# Patient Record
Sex: Male | Born: 1955 | Race: White | Hispanic: No | Marital: Married | Smoking: Former smoker
Health system: Southern US, Community
[De-identification: ages and names within clinical notes are randomized; demographics above are authoritative.]

## PROBLEM LIST (undated history)

## (undated) ENCOUNTER — Emergency Department (HOSPITAL_BASED_OUTPATIENT_CLINIC_OR_DEPARTMENT_OTHER)

## (undated) DIAGNOSIS — I119 Hypertensive heart disease without heart failure: Secondary | ICD-10-CM

## (undated) DIAGNOSIS — J189 Pneumonia, unspecified organism: Secondary | ICD-10-CM

## (undated) DIAGNOSIS — F32A Depression, unspecified: Secondary | ICD-10-CM

## (undated) DIAGNOSIS — F1010 Alcohol abuse, uncomplicated: Secondary | ICD-10-CM

## (undated) DIAGNOSIS — G4733 Obstructive sleep apnea (adult) (pediatric): Secondary | ICD-10-CM

## (undated) DIAGNOSIS — R519 Headache, unspecified: Secondary | ICD-10-CM

## (undated) DIAGNOSIS — U071 COVID-19: Secondary | ICD-10-CM

## (undated) DIAGNOSIS — C61 Malignant neoplasm of prostate: Secondary | ICD-10-CM

## (undated) DIAGNOSIS — M1990 Unspecified osteoarthritis, unspecified site: Secondary | ICD-10-CM

## (undated) DIAGNOSIS — I480 Paroxysmal atrial fibrillation: Secondary | ICD-10-CM

## (undated) DIAGNOSIS — E669 Obesity, unspecified: Secondary | ICD-10-CM

## (undated) DIAGNOSIS — I5032 Chronic diastolic (congestive) heart failure: Secondary | ICD-10-CM

## (undated) DIAGNOSIS — I2510 Atherosclerotic heart disease of native coronary artery without angina pectoris: Secondary | ICD-10-CM

## (undated) DIAGNOSIS — I4891 Unspecified atrial fibrillation: Secondary | ICD-10-CM

## (undated) DIAGNOSIS — K219 Gastro-esophageal reflux disease without esophagitis: Secondary | ICD-10-CM

## (undated) DIAGNOSIS — R7303 Prediabetes: Secondary | ICD-10-CM

## (undated) DIAGNOSIS — Z973 Presence of spectacles and contact lenses: Secondary | ICD-10-CM

## (undated) DIAGNOSIS — F419 Anxiety disorder, unspecified: Secondary | ICD-10-CM

## (undated) DIAGNOSIS — E781 Pure hyperglyceridemia: Secondary | ICD-10-CM

## (undated) DIAGNOSIS — I499 Cardiac arrhythmia, unspecified: Secondary | ICD-10-CM

## (undated) DIAGNOSIS — E039 Hypothyroidism, unspecified: Secondary | ICD-10-CM

## (undated) DIAGNOSIS — Z87891 Personal history of nicotine dependence: Secondary | ICD-10-CM

## (undated) HISTORY — DX: Unspecified atrial fibrillation: I48.91

## (undated) HISTORY — DX: Obesity, unspecified: E66.9

## (undated) HISTORY — PX: COLONOSCOPY: SHX174

## (undated) HISTORY — DX: Hypertensive heart disease without heart failure: I11.9

## (undated) HISTORY — DX: Anxiety disorder, unspecified: F41.9

## (undated) HISTORY — DX: Pure hyperglyceridemia: E78.1

## (undated) HISTORY — PX: NO PAST SURGERIES: SHX2092

## (undated) HISTORY — DX: Alcohol abuse, uncomplicated: F10.10

## (undated) HISTORY — PX: PROSTATE BIOPSY: SHX241

## (undated) HISTORY — PX: OTHER SURGICAL HISTORY: SHX169

## (undated) HISTORY — DX: Atherosclerotic heart disease of native coronary artery without angina pectoris: I25.10

## (undated) HISTORY — DX: Paroxysmal atrial fibrillation: I48.0

## (undated) HISTORY — DX: Gastro-esophageal reflux disease without esophagitis: K21.9

## (undated) HISTORY — DX: Chronic diastolic (congestive) heart failure: I50.32

## (undated) HISTORY — DX: Personal history of nicotine dependence: Z87.891

## (undated) HISTORY — DX: Hypothyroidism, unspecified: E03.9

## (undated) HISTORY — DX: Obstructive sleep apnea (adult) (pediatric): G47.33

---

## 2012-08-16 ENCOUNTER — Encounter: Payer: Self-pay | Admitting: Gastroenterology

## 2012-09-18 ENCOUNTER — Ambulatory Visit (AMBULATORY_SURGERY_CENTER): Payer: BC Managed Care – PPO | Admitting: *Deleted

## 2012-09-18 VITALS — Ht 67.0 in | Wt 220.4 lb

## 2012-09-18 DIAGNOSIS — Z1211 Encounter for screening for malignant neoplasm of colon: Secondary | ICD-10-CM

## 2012-09-18 MED ORDER — NA SULFATE-K SULFATE-MG SULF 17.5-3.13-1.6 GM/177ML PO SOLN: ORAL | Status: DC

## 2012-09-19 ENCOUNTER — Encounter: Payer: Self-pay | Admitting: Gastroenterology

## 2012-10-01 ENCOUNTER — Encounter: Payer: Self-pay | Admitting: Gastroenterology

## 2012-10-01 ENCOUNTER — Ambulatory Visit (AMBULATORY_SURGERY_CENTER): Payer: BC Managed Care – PPO | Admitting: Gastroenterology

## 2012-10-01 VITALS — BP 148/84 | HR 68 | Temp 98.1°F | Resp 14 | Ht 67.0 in | Wt 220.0 lb

## 2012-10-01 DIAGNOSIS — Z1211 Encounter for screening for malignant neoplasm of colon: Secondary | ICD-10-CM

## 2012-10-01 DIAGNOSIS — D126 Benign neoplasm of colon, unspecified: Secondary | ICD-10-CM

## 2012-10-01 MED ORDER — SODIUM CHLORIDE 0.9 % IV SOLN: 500.0000 mL | INTRAVENOUS | Status: DC

## 2012-10-01 NOTE — Progress Notes (Signed)
Patient did not experience any of the following events: a burn prior to discharge; a fall within the facility; wrong site/side/patient/procedure/implant event; or a hospital transfer or hospital admission upon discharge from the facility. (G8907) Patient did not have preoperative order for IV antibiotic SSI prophylaxis. (G8918)  

## 2012-10-01 NOTE — Progress Notes (Signed)
Called to room to assist during endoscopic procedure.  Patient ID and intended procedure confirmed with present staff. Received instructions for my participation in the procedure from the performing physician.  

## 2012-10-01 NOTE — Progress Notes (Signed)
Lidocaine-40mg IV prior to Propofol InductionPropofol given over incremental dosages 

## 2012-10-01 NOTE — Patient Instructions (Signed)
YOU HAD AN ENDOSCOPIC PROCEDURE TODAY AT THE [facility]: Refer to the procedure report that was given to you for any specific questions about what was found during the examination.  If the procedure report does not answer your questions, please call your gastroenterologist to clarify.  If you requested that your care partner not be given the details of your procedure findings, then the procedure report has been included in a sealed envelope for you to review at your convenience later.  YOU SHOULD EXPECT: Some feelings of bloating in the abdomen. Passage of more gas than usual.  Walking can help get rid of the air that was put into your GI tract during the procedure and reduce the bloating. If you had a lower endoscopy (such as a colonoscopy or flexible sigmoidoscopy) you may notice spotting of blood in your stool or on the toilet paper. If you underwent a bowel prep for your procedure, then you may not have a normal bowel movement for a few days.  DIET: Your first meal following the procedure should be a light meal and then it is ok to progress to your normal diet.  A half-sandwich or bowl of soup is an example of a good first meal.  Heavy or fried foods are harder to digest and may make you feel nauseous or bloated.  Likewise meals heavy in dairy and vegetables can cause extra gas to form and this can also increase the bloating.  Drink plenty of fluids but you should avoid alcoholic beverages for 24 hours.  ACTIVITY: Your care partner should take you home directly after the procedure.  You should plan to take it easy, moving slowly for the rest of the day.  You can resume normal activity the day after the procedure however you should NOT DRIVE or use heavy machinery for 24 hours (because of the sedation medicines used during the test).    SYMPTOMS TO REPORT IMMEDIATELY: A gastroenterologist can be reached at any hour.  During normal business hours, 8:30 AM to 5:00 PM Monday through Friday,  call [phone].  After hours and on weekends, please call the GI answering service at [phone] who will take a message and have the physician on call contact you.   Following lower endoscopy (colonoscopy or flexible sigmoidoscopy):  Excessive amounts of blood in the stool  Significant tenderness or worsening of abdominal pains  Swelling of the abdomen that is new, acute  Fever of 100F or higher    FOLLOW UP: If any biopsies were taken you will be contacted by phone or by letter within the next 1-3 weeks.  Call your gastroenterologist if you have not heard about the biopsies in 3 weeks.  Our staff will call the home number listed on your records the next business day following your procedure to check on you and address any questions or concerns that you may have at that time regarding the information given to you following your procedure. This is a courtesy call and so if there is no answer at the home number and we have not heard from you through the emergency physician on call, we will assume that you have returned to your regular daily activities without incident.  SIGNATURES/CONFIDENTIALITY: You and/or your care partner have signed paperwork which will be entered into your electronic medical record.  These signatures attest to the fact that that the information above on your After Visit Summary has been reviewed and is understood.  Full responsibility of the confidentiality   of this discharge information lies with you and/or your care-partner.     

## 2012-10-01 NOTE — Op Note (Signed)
[facility] [address] [place], [address]   COLONOSCOPY PROCEDURE REPORT  PATIENT: Carl Humphrey, Carl Humphrey  MR#: [id number] BIRTHDATE: June 12, 1955 , 56  yrs. old GENDER: Male ENDOSCOPIST: Louis Meckel, MD REFERRED [id number] Ricki Miller, M.D. PROCEDURE DATE:  10/01/2012 PROCEDURE:   Colonoscopy with cold biopsy polypectomy ASA CLASS:   Class II INDICATIONS:average risk screening. MEDICATIONS: MAC sedation, administered by CRNA and propofol (Diprivan) 400mg  IV  DESCRIPTION OF PROCEDURE:   After the risks benefits and alternatives of the procedure were thoroughly explained, informed consent was obtained.  A digital rectal exam revealed external hemorrhoids.   The LB CF-[id number]  endoscope was introduced through the anus and advanced to the cecum, which was identified by both the appendix and ileocecal valve. No adverse events experienced.   The quality of the prep was excellent using Suprep The instrument was then slowly withdrawn as the colon was fully examined.      COLON FINDINGS: A sessile polyp measuring 2 mm in size was found in the sigmoid colon.  A polypectomy was performed with cold forceps. The resection was complete and the polyp tissue was completely retrieved.   Internal hemorrhoids were found.   The colon mucosa was otherwise normal.  Retroflexed views revealed no abnormalities. The time to cecum=1 minutes 46 seconds.  Withdrawal time=6 minutes 10 seconds.  The scope was withdrawn and the procedure completed. COMPLICATIONS: There were no complications.  ENDOSCOPIC IMPRESSION: 1.   Sessile polyp measuring 2 mm in size was found in the sigmoid colon; polypectomy was performed with cold forceps 2.   Internal hemorrhoids 3.   The colon mucosa was otherwise normal  RECOMMENDATIONS: If the polyp(s) removed today are proven to be adenomatous (pre-cancerous) polyps, you will need a repeat colonoscopy in 5 years.  Otherwise you should continue to  follow colorectal cancer screening guidelines for "routine risk" patients with colonoscopy in 10 years.  You will receive a letter within 1-2 weeks with the results of your biopsy as well as final recommendations.  Please call my office if you have not received a letter after 3 weeks.   eSigned:  Louis Meckel, MD 10/01/2012 8:31 AM   cc:

## 2012-10-02 ENCOUNTER — Telehealth: Payer: Self-pay | Admitting: *Deleted

## 2012-10-02 NOTE — Telephone Encounter (Signed)
  Follow up Call-  Call back number 10/01/2012  Post procedure Call Back phone  # [phone]  Permission to leave phone message Yes     Patient questions:  Message left for patient to call us if necessary.

## 2012-10-15 ENCOUNTER — Encounter: Payer: Self-pay | Admitting: Gastroenterology

## 2013-08-05 ENCOUNTER — Other Ambulatory Visit: Payer: Self-pay | Admitting: Internal Medicine

## 2013-08-05 DIAGNOSIS — R1032 Left lower quadrant pain: Secondary | ICD-10-CM

## 2013-08-06 ENCOUNTER — Ambulatory Visit
Admission: RE | Admit: 2013-08-06 | Discharge: 2013-08-06 | Disposition: A | Discharge status: Home / Self Care | Payer: BC Managed Care – PPO | Source: Ambulatory Visit | Attending: Internal Medicine | Admitting: Internal Medicine

## 2013-08-06 DIAGNOSIS — R1032 Left lower quadrant pain: Secondary | ICD-10-CM

## 2013-08-06 MED ORDER — IOHEXOL 300 MG/ML  SOLN
125.0000 mL | Freq: Once | INTRAMUSCULAR | Status: AC | PRN
  Administered 2013-08-06: 125 mL via INTRAVENOUS

## 2013-09-12 ENCOUNTER — Other Ambulatory Visit (HOSPITAL_COMMUNITY): Payer: Self-pay | Admitting: Internal Medicine

## 2013-09-12 DIAGNOSIS — K6389 Other specified diseases of intestine: Secondary | ICD-10-CM

## 2013-09-18 ENCOUNTER — Ambulatory Visit (HOSPITAL_COMMUNITY)
Admission: RE | Admit: 2013-09-18 | Discharge: 2013-09-18 | Disposition: A | Discharge status: Home / Self Care | Payer: BC Managed Care – PPO | Source: Ambulatory Visit | Attending: Internal Medicine | Admitting: Internal Medicine

## 2013-09-18 DIAGNOSIS — K6389 Other specified diseases of intestine: Secondary | ICD-10-CM

## 2013-09-18 DIAGNOSIS — R109 Unspecified abdominal pain: Secondary | ICD-10-CM | POA: Insufficient documentation

## 2015-08-13 ENCOUNTER — Encounter: Payer: Self-pay | Admitting: Nurse Practitioner

## 2015-08-13 ENCOUNTER — Ambulatory Visit (INDEPENDENT_AMBULATORY_CARE_PROVIDER_SITE_OTHER): Payer: BLUE CROSS/BLUE SHIELD | Admitting: Nurse Practitioner

## 2015-08-13 VITALS — BP 150/90 | HR 80 | Ht 67.0 in | Wt 225.4 lb

## 2015-08-13 DIAGNOSIS — E785 Hyperlipidemia, unspecified: Secondary | ICD-10-CM | POA: Insufficient documentation

## 2015-08-13 DIAGNOSIS — E039 Hypothyroidism, unspecified: Secondary | ICD-10-CM | POA: Diagnosis not present

## 2015-08-13 DIAGNOSIS — F1010 Alcohol abuse, uncomplicated: Secondary | ICD-10-CM

## 2015-08-13 DIAGNOSIS — I4891 Unspecified atrial fibrillation: Secondary | ICD-10-CM

## 2015-08-13 DIAGNOSIS — I119 Hypertensive heart disease without heart failure: Secondary | ICD-10-CM

## 2015-08-13 DIAGNOSIS — E781 Pure hyperglyceridemia: Secondary | ICD-10-CM

## 2015-08-13 DIAGNOSIS — G4733 Obstructive sleep apnea (adult) (pediatric): Secondary | ICD-10-CM

## 2015-08-13 DIAGNOSIS — K219 Gastro-esophageal reflux disease without esophagitis: Secondary | ICD-10-CM | POA: Insufficient documentation

## 2015-08-13 LAB — CBC
HCT: 47.2 % (ref 39.0–52.0)
Hemoglobin: 16.5 g/dL (ref 13.0–17.0)
MCH: 30.1 pg (ref 26.0–34.0)
MCHC: 35 g/dL (ref 30.0–36.0)
MCV: 86.1 fL (ref 78.0–100.0)
MPV: 10.4 fL (ref 8.6–12.4)
Platelets: 196 10*3/uL (ref 150–400)
RBC: 5.48 MIL/uL (ref 4.22–5.81)
RDW: 13.8 % (ref 11.5–15.5)
WBC: 6.3 10*3/uL (ref 4.0–10.5)

## 2015-08-13 MED ORDER — APIXABAN 5 MG PO TABS: 5.0000 mg | ORAL_TABLET | Freq: Two times a day (BID) | ORAL | Status: DC

## 2015-08-13 MED ORDER — METOPROLOL TARTRATE 50 MG PO TABS: 50.0000 mg | ORAL_TABLET | Freq: Two times a day (BID) | ORAL | Status: DC

## 2015-08-13 NOTE — Progress Notes (Signed)
Cardiology Clinic Note   Patient Name: Carl Humphrey Date of Encounter: 08/13/2015  Primary Care Provider:  Thressa Sheller, MD Primary Cardiologist:  New - seen by Carl Comber, MD   Patient Profile    60 y/o ? w/o prior cardiac hx who presents today for new finding of AFib RVR.  Past Medical History    Past Medical History  Diagnosis Date  . GERD (gastroesophageal reflux disease)   . Hypertensive heart disease   . Hypothyroidism   . Anxiety   . Hypertriglyceridemia   . Obstructive sleep apnea     a. On CPAP x ~ 10 yrs.  [name] ETOH abuse   . Atrial fibrillation (Carl Humphrey)     a. Dx 08/13/2015;  b. CHA2DS2VASc = 1.   Past Surgical History  Procedure Laterality Date  . No prior surgery      Allergies  No Known Allergies  History of Present Illness    60 y/o ? with a h/o HTN, HTG, OSA on CPAP x 10 yrs, hypothyroidism, anxiety, GERD, and tobacco abuse.  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  In that setting, he drinks 8-10 beers daily and has done so for many years.  Over the past six months he has been Having more difficulty controlling his blood pressure. He has had some outpatient adjustments made to his antihypertensive regimen. He checks his blood pressure regularly at home but says he does not pay attention to the pulse rate that is provided. Today, he presented to his primary care provider for further evaluation and management of his blood pressure when he was noted to be tachycardic. An ECG was performed and revealed atrial fibrillation with rapid ventricular response at a rate of 121 bpm. His primary care provider contacted our office and he was placed on my schedule today. He denies any history of chest pain, dyspnea on exertion, palpitations, presyncope, or syncope. He is currently asymptomatic.  Home Medications    Prior to Admission medications   Medication Sig Start  Date End Date Taking? Authorizing Provider  ALPRAZolam Carl Humphrey) 0.5 MG tablet  08/11/15  Yes Historical Provider, MD  amLODipine (NORVASC) 5 MG tablet Take 5 mg by mouth daily. 07/14/15  Yes Historical Provider, MD  azithromycin (ZITHROMAX) 250 MG tablet Take 250 mg by mouth once a week.  08/13/15  Yes Historical Provider, MD  Chromium Picolinate 800 MCG TABS Take by mouth daily.   Yes Historical Provider, MD  esomeprazole (NEXIUM) 40 MG packet Take 40 mg by mouth daily before breakfast.   Yes Historical Provider, MD  fenofibrate 160 MG tablet Take 160 mg by mouth daily.  09/28/12  Yes Historical Provider, MD  hydrochlorothiazide (HYDRODIURIL) 25 MG tablet  09/28/12  Yes Historical Provider, MD  levothyroxine (SYNTHROID, LEVOTHROID) 50 MCG tablet Take 50 mcg by mouth daily before breakfast.   Yes Historical Provider, MD  Multiple Vitamins-Minerals (MULTIVITAMIN PO) Take by mouth daily.   Yes Historical Provider, MD  Omega-3 Fatty Acids (FISH OIL) 1000 MG CAPS Take by mouth daily.   Yes Historical Provider, MD  valsartan (DIOVAN) 320 MG tablet Take 320 mg by mouth daily.   Yes Historical Provider, MD  venlafaxine XR (EFFEXOR-XR) 75 MG 24 hr capsule Take 75 mg by mouth daily.   Yes Historical Provider, MD  zolpidem (AMBIEN CR) 12.5 MG CR tablet Take 12.5 mg by mouth at bedtime  as needed for sleep.   Yes Historical Provider, MD  apixaban (ELIQUIS) 5 MG TABS tablet - ADDED TODAY Take 1 tablet (5 mg total) by mouth 2 (two) times daily. 08/13/15   Carl Mire, NP  metoprolol (LOPRESSOR) 50 MG tablet - ADDED TODAY Take 1 tablet (50 mg total) by mouth 2 (two) times daily. 08/13/15   Carl Mire, NP    Family History    Family History  Problem Relation Age of Onset  . Colon cancer Neg Hx   . Heart attack Neg Hx   . Stroke Neg Hx   . Hypertension Mother     alive @ 20  . Hypertension Father     died of heart failure @ 41  . Multiple sclerosis Sister     deceased  . Other Sister     A &  W  . Other Sister     A & W    Social History    Social History   Social History  . Marital Status: Unknown    Spouse Name: N/A  . Number of Children: N/A  . Years of Education: N/A   Occupational History  . Not on file.   Social History Main Topics  . Smoking status: Former Smoker    Quit date: 09/18/1997  . Smokeless tobacco: Former Systems developer    Quit date: 09/18/1997  . Alcohol Use: Yes     Comment: He drinks 8-10 beers/day.  . Drug Use: Yes    Special: Marijuana     Comment: uses marijuana 1 x month  . Sexual Activity: Not on file   Other Topics Concern  . Not on file   Social History Narrative   Lives in [place] with his wife.  He walks his dog about 2.5 miles/day w/o limitations.  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     Review of Systems    General:  No chills, fever, night sweats or weight changes.  Cardiovascular:  No chest pain, dyspnea on exertion, edema, orthopnea, palpitations, paroxysmal nocturnal dyspnea. Dermatological: No rash, lesions/masses Respiratory: No cough, dyspnea Urologic: No hematuria, dysuria Abdominal:   No nausea, vomiting, diarrhea, bright red blood per rectum, melena, or hematemesis Neurologic:  No visual changes, wkns, changes in mental status. All other systems reviewed and are otherwise negative except as noted above.  Physical Exam    VS:  BP 150/90 mmHg  Pulse 80  Ht 5\' 7"  (1.702 m)  Wt 225 lb 6.4 oz (102.241 kg)  BMI 35.29 kg/m2 , BMI Body mass index is 35.29 kg/(m^2). GEN: Well nourished, well developed, in no acute distress. HEENT: normal. Neck: Supple, no JVD, carotid bruits, or masses. Cardiac: IR, IR, no murmurs, rubs, or gallops. No clubbing, cyanosis, edema.  Radials/DP/PT 2+ and equal bilaterally.  Respiratory:  Respirations regular and unlabored, clear to auscultation bilaterally. GI: Soft, nontender, nondistended, BS + x 4. MS: no deformity or atrophy. Skin: warm and dry, no  rash. Neuro:  Strength and sensation are intact. Psych: Normal affect.  Accessory Clinical Findings    ECG - Atrial fibrillation, 121, left axis deviation, incomplete right bundle, nonspecific ST-T changes.  Assessment & Plan   1.  Atrial fibrillation with rapid ventricular response: Patient presented today with a new finding of A. fib and RVR with heart rate 121. He is asymptomatic. He does check his blood pressure at home regularly but says he does not pay attention to it the pulses and thus  we don't really have any gauge for when his A. fib might have started. With a history of hypertension, he has a CHA2DS2VASc of 1. We discussed the pathophysiology and management of atrial fibrillation at length today. I have also discussed his case with Dr. Meda Coffee who has seen him independently. I will check a CBC, basic metabolic panel, magnesium, and TSH today. I will arrange for 2-D echocardiogram. I will add metoprolol 50 mg twice a day as well as eliquis 5 mg twice a day and plan to follow-up in the office in 3 weeks. If he remains in atrial fibrillation at that time, we will arrange for outpatient cardioversion. He does have a heavy alcohol abuse history, drinking 8-10 beers per day. He was advised to reduce and cease his alcohol use.  2.  Hypertensive Heart Disease:  BP currently elevated @ 150/90.  He says that this has been trending up over the past 6 mos.  He has been on ARB therapy for some time and more recently his PCP added amlodipine.  I am adding metoprolol 50 bid today in the setting of #1.  3.  HTG:  Followed by primary care.  He is on tricor.  4.  ETOH Abuse:  We discussed the role that alcoholism plays in the development of AF.  Reduction and cessation of etoh use advised.    5.  Hypothyroidism:  F/u TSH today in setting of above.  6.  OSA:  Says he's been compliant with CPAP x 10 yrs.  7.  Disposition:  F/U with Dr. Meda Coffee in 3 wks to re-eval rhythm and set up for DCCV if  appropriate.  Murray Hodgkins, NP 08/13/2015, 12:48 PM  The patient was seen, examined and discussed with Carmela Rima, NP and agree as above.  This is a pleasant 60 year old gentleman with prior medical history of hypertension, hyperlipidemia, obstructive sleep apnea on C Pap machine, obesity, physical inactivity and alcohol abuse post referred to [place] today by his primary care physician for an urgent consultation for new diagnosis of atrial fibrillation. The patient was seen in his primary care office today for difficult to control hypertension. His primary care physician noticed to have irregular heartbeats and EKG showed an atrial fibrillation with rapid ventricular rate. The patient denies any palpitations or recent syncope. 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 He denies any history of GI bleed or anemia. His CHADS-VASc is 1 for hypertension however we'll start hemodialysis 5 mg by mouth twice a day considering the we will attempt cardioversion in 3-4 weeks. In the meantime we will obtain a an echocardiogram to evaluate for solid diastolic function and left atrial size. Patient is advised to decrease amount of alcohol ideally to stop altogether but he is not ready to do that yet. Follow-up in 3 weeks, if still and atrial fibrillation we'll schedule a cardioversion. We will also start him on metoprolol for rate control.  Dorothy Spark 08/13/2015

## 2015-08-13 NOTE — Patient Instructions (Signed)
Medication Instructions:  1) START Metoprolol Tartrate 50mg  twice daily 2) START Eliquis 5mg  twice daily  Labwork: CBC, BMET, TSH, Magnesium today  Testing/Procedures: Your physician has requested that you have an echocardiogram. Echocardiography is a painless test that uses sound waves to create images of your heart. It provides your doctor with information about the size and shape of your heart and how well your heart's chambers and valves are working. This procedure takes approximately one hour. There are no restrictions for this procedure.    Follow-Up: Your physician recommends that you schedule a follow-up appointment in: 3 weeks with Dr. Meda Coffee or an extender on a day that Dr. Meda Coffee is in the office.   Any Other Special Instructions Will Be Listed Below (If Applicable).     If you need a refill on your cardiac medications before your next appointment, please call your pharmacy.

## 2015-08-14 LAB — BASIC METABOLIC PANEL
BUN: 12 mg/dL (ref 7–25)
CALCIUM: 9.1 mg/dL (ref 8.6–10.3)
CO2: 21 mmol/L (ref 20–31)
CREATININE: 0.75 mg/dL (ref 0.70–1.33)
Chloride: 103 mmol/L (ref 98–110)
Glucose, Bld: 125 mg/dL — ABNORMAL HIGH (ref 65–99)
Potassium: 4.1 mmol/L (ref 3.5–5.3)
Sodium: 139 mmol/L (ref 135–146)

## 2015-08-14 LAB — TSH: TSH: 2.21 mIU/L (ref 0.40–4.50)

## 2015-08-14 LAB — MAGNESIUM: Magnesium: 1.8 mg/dL (ref 1.5–2.5)

## 2015-08-30 ENCOUNTER — Other Ambulatory Visit: Payer: Self-pay

## 2015-08-30 ENCOUNTER — Ambulatory Visit (HOSPITAL_COMMUNITY): Payer: BLUE CROSS/BLUE SHIELD | Attending: Cardiovascular Disease

## 2015-08-30 DIAGNOSIS — I4891 Unspecified atrial fibrillation: Secondary | ICD-10-CM | POA: Diagnosis not present

## 2015-08-30 DIAGNOSIS — E785 Hyperlipidemia, unspecified: Secondary | ICD-10-CM | POA: Diagnosis not present

## 2015-08-30 DIAGNOSIS — Z87891 Personal history of nicotine dependence: Secondary | ICD-10-CM | POA: Diagnosis not present

## 2015-08-30 DIAGNOSIS — I119 Hypertensive heart disease without heart failure: Secondary | ICD-10-CM | POA: Insufficient documentation

## 2015-09-02 NOTE — Progress Notes (Signed)
Cardiology Office Note:    Date:  09/03/2015   ID:  [name], DOB [birth year], MRN [id number]  PCP:  Thressa Sheller, MD  Cardiologist:  Dr. Ena Dawley   Electrophysiologist:  n/a  Referring MD: Thressa Sheller, MD   Chief Complaint  Patient presents with  . Atrial Fibrillation    follow up - consider DCCV    History of Present Illness:     Carl Humphrey is a 60 y.o. male with a hx of HTN, hypertriglyceridemia, OSA on CPAP, hypothyroidism, anxiety, GERD, tobacco abuse, heavy ETOH use.  He was evaluated by Carl Bayley, NP on 08/13/15 for AF with RVR.  He was seen in conjuction with Dr. Ena Dawley.  CHADS2-VASc=1 (HTN).  He was placed on Metoprolol Tartrate for rate control.  He was placed on Apixaban with an eye towards DCCV if her remains in AFib after 3 weeks of uninterrupted anticoagulation.    TSH was normal. Echocardiogram demonstrated normal LV function.   Returns for FU.  He is back in NSR.  He is not aware of any changes in his symptoms.  He is not certain he has had any palpitations.  Denies chest pain, dyspnea, syncope, orthopnea, PND, edema.  Denies any bleeding issues.     Past Medical History  Diagnosis Date  . GERD (gastroesophageal reflux disease)   . Hypertensive heart disease   . Hypothyroidism   . Anxiety   . Hypertriglyceridemia   . Obstructive sleep apnea     a. On CPAP x ~ 10 yrs.  [name] ETOH abuse   . Atrial fibrillation ([place])     a. Dx 08/13/2015;  b. CHA2DS2VASc = 1.    Past Surgical History  Procedure Laterality Date  . No prior surgery      Current Medications: Outpatient Prescriptions Prior to Visit  Medication Sig Dispense Refill  . ALPRAZolam (XANAX) 0.5 MG tablet Take 0.5 mg by mouth daily.   3  . amLODipine (NORVASC) 5 MG tablet Take 5 mg by mouth daily.  0  . apixaban (ELIQUIS) 5 MG TABS tablet Take 1 tablet (5 mg total) by mouth 2 (two) times daily. 180 tablet 3  . azithromycin (ZITHROMAX) 250 MG tablet Take 250 mg by mouth  once a week.   0  . Chromium Picolinate 800 MCG TABS Take by mouth daily.    [name] esomeprazole (NEXIUM) 40 MG packet Take 40 mg by mouth daily before breakfast.    . fenofibrate 160 MG tablet Take 160 mg by mouth daily.     . hydrochlorothiazide (HYDRODIURIL) 25 MG tablet Take 25 mg by mouth daily.     [name] levothyroxine (SYNTHROID, LEVOTHROID) 50 MCG tablet Take 50 mcg by mouth daily before breakfast.    . metoprolol (LOPRESSOR) 50 MG tablet Take 1 tablet (50 mg total) by mouth 2 (two) times daily. 180 tablet 3  . Multiple Vitamins-Minerals (MULTIVITAMIN PO) Take by mouth daily.    . Omega-3 Fatty Acids (FISH OIL) 1000 MG CAPS Take by mouth daily.    . valsartan (DIOVAN) 320 MG tablet Take 320 mg by mouth daily.    [name] zolpidem (AMBIEN CR) 12.5 MG CR tablet Take 12.5 mg by mouth at bedtime as needed for sleep.    [name] venlafaxine XR (EFFEXOR-XR) 75 MG 24 hr capsule Take 75 mg by mouth daily. Reported on 09/03/2015     No facility-administered medications prior to visit.     Allergies:   Review of patient's allergies indicates no known allergies.  Social History   Social History  . Marital Status: Unknown    Spouse Name: N/A  . Number of Children: N/A  . Years of Education: N/A   Social History Main Topics  . Smoking status: Former Smoker    Quit date: 09/18/1997  . Smokeless tobacco: Former Systems developer    Quit date: 09/18/1997  . Alcohol Use: Yes     Comment: He drinks 8-10 beers/day.  . Drug Use: Yes    Special: Marijuana     Comment: uses marijuana 1 x month  . Sexual Activity: Not Asked   Other Topics Concern  . None   Social History Narrative   Lives in [place] with his wife.  He walks his dog about 2.5 miles/day w/o limitations.  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     Family History:  The patient's family history includes Hypertension in his father and mother; Multiple sclerosis in his sister; Other in his sister and sister. There is no history of Colon  cancer, Heart attack, or Stroke.   ROS:   Please see the history of present illness.    Review of Systems  Constitution: Positive for malaise/fatigue.  Psychiatric/Behavioral: Positive for depression.  All other systems reviewed and are negative.   Physical Exam:    VS:  BP 112/62 mmHg  Pulse 64  Ht 5\' 7"  (1.702 m)  Wt 227 lb 12.8 oz (103.329 kg)  BMI 35.67 kg/m2   GEN: Well nourished, well developed, in no acute distress HEENT: normal Neck: no JVD, no masses Cardiac: Normal S1/S2,  RRR; no murmurs, rubs, or gallops, no edema  Respiratory:  clear to auscultation bilaterally; no wheezing, rhonchi or rales GI: soft, nontender, nondistended MS: no deformity or atrophy Skin: warm and dry Neuro: No focal deficits  Psych: Alert and oriented x 3, normal affect  Wt Readings from Last 3 Encounters:  09/03/15 227 lb 12.8 oz (103.329 kg)  08/13/15 225 lb 6.4 oz (102.241 kg)  10/01/12 220 lb (99.791 kg)      Studies/Labs Reviewed:     EKG:  EKG is  ordered today.  The ekg ordered today demonstrates NSR, HR 65, leftward axis, inc RBBB, QTc 424 ms  Recent Labs: 08/13/2015: BUN 12; Creat 0.75; Hemoglobin 16.5; Magnesium 1.8; Platelets 196; Potassium 4.1; Sodium 139; TSH 2.21   Recent Lipid Panel No results found for: CHOL, TRIG, HDL, CHOLHDL, VLDL, LDLCALC, LDLDIRECT  Additional studies/ records that were reviewed today include:   Echocardiogram 08/30/15 EF 60-65%, normal wall motion, mild LAE  ASSESSMENT:     1. Paroxysmal atrial fibrillation (HCC)   2. Hypertensive heart disease without heart failure   3. Hypertriglyceridemia   4. Hypothyroidism, unspecified hypothyroidism type   5. Obstructive sleep apnea   6. ETOH abuse   7. Incomplete RBBB     PLAN:     In order of problems listed above:  1. PAF - He is back in NSR.  He does not seem to be symptomatic with AF.  CHADS2-VASc=1.  Therefore he does not need long term anticoagulation.  I will have him continue  Metoprolol Tartrate at his current dose.  Continue Apixaban x 1 month now that he is back in NSR.  At FU, if still in NSR, plan on DC Apixaban.     2. HTN - Controlled.  He is a little fatigued.  If continues and he notes BP is lower, he can hold the Amlodipine.    3. HL -  FU with PCP.    4. Hypothyroidism - Recent TSH normal.  5. OSA - Continue CPAP.  6. ETOH - He knows to limit ETOH to reduce risk of AFib.   7. Incomplete RBBB - No symptoms of chest pain or dyspnea.  Recent echo normal.    Medication Adjustments/Labs and Tests Ordered: Current medicines are reviewed at length with the patient today.  Concerns regarding medicines are outlined above.  Medication changes, Labs and Tests ordered today are outlined in the Patient Instructions noted below. Patient Instructions  Medication Instructions:  Your physician recommends that you continue on your current medications as directed. Please refer to the Current Medication list given to you today. Labwork: NONE Testing/Procedures: NONE Follow-Up: DR. Meda Coffee 10/08/15 10:30 Any Other Special Instructions Will Be Listed Below (If Applicable). If you need a refill on your cardiac medications before your next appointment, please call your pharmacy.    Signed, Richardson Dopp, PA-C  09/03/2015 12:53 PM    [facility] [place], [place], [place]  [address] Phone: [phone]; Fax: [phone]

## 2015-09-03 ENCOUNTER — Encounter: Payer: Self-pay | Admitting: Physician Assistant

## 2015-09-03 ENCOUNTER — Ambulatory Visit (INDEPENDENT_AMBULATORY_CARE_PROVIDER_SITE_OTHER): Payer: BLUE CROSS/BLUE SHIELD | Admitting: Physician Assistant

## 2015-09-03 VITALS — BP 112/62 | HR 64 | Ht 67.0 in | Wt 227.8 lb

## 2015-09-03 DIAGNOSIS — E781 Pure hyperglyceridemia: Secondary | ICD-10-CM | POA: Diagnosis not present

## 2015-09-03 DIAGNOSIS — I480 Paroxysmal atrial fibrillation: Secondary | ICD-10-CM

## 2015-09-03 DIAGNOSIS — F1010 Alcohol abuse, uncomplicated: Secondary | ICD-10-CM

## 2015-09-03 DIAGNOSIS — I119 Hypertensive heart disease without heart failure: Secondary | ICD-10-CM

## 2015-09-03 DIAGNOSIS — E039 Hypothyroidism, unspecified: Secondary | ICD-10-CM

## 2015-09-03 DIAGNOSIS — I4510 Unspecified right bundle-branch block: Secondary | ICD-10-CM

## 2015-09-03 DIAGNOSIS — I450 Right fascicular block: Secondary | ICD-10-CM

## 2015-09-03 DIAGNOSIS — G4733 Obstructive sleep apnea (adult) (pediatric): Secondary | ICD-10-CM

## 2015-09-03 NOTE — Patient Instructions (Addendum)
Medication Instructions:  Your physician recommends that you continue on your current medications as directed. Please refer to the Current Medication list given to you today. Labwork: NONE Testing/Procedures: NONE Follow-Up: DR. Meda Coffee 10/08/15 10:30 Any Other Special Instructions Will Be Listed Below (If Applicable). If you need a refill on your cardiac medications before your next appointment, please call your pharmacy.

## 2015-10-08 ENCOUNTER — Encounter: Payer: Self-pay | Admitting: Cardiology

## 2015-10-08 ENCOUNTER — Ambulatory Visit (INDEPENDENT_AMBULATORY_CARE_PROVIDER_SITE_OTHER): Payer: BLUE CROSS/BLUE SHIELD | Admitting: Cardiology

## 2015-10-08 VITALS — BP 132/80 | HR 72 | Ht 67.0 in | Wt 228.0 lb

## 2015-10-08 DIAGNOSIS — I4510 Unspecified right bundle-branch block: Secondary | ICD-10-CM | POA: Insufficient documentation

## 2015-10-08 DIAGNOSIS — I480 Paroxysmal atrial fibrillation: Secondary | ICD-10-CM

## 2015-10-08 DIAGNOSIS — F1010 Alcohol abuse, uncomplicated: Secondary | ICD-10-CM

## 2015-10-08 DIAGNOSIS — Z79899 Other long term (current) drug therapy: Secondary | ICD-10-CM

## 2015-10-08 DIAGNOSIS — G4733 Obstructive sleep apnea (adult) (pediatric): Secondary | ICD-10-CM | POA: Diagnosis not present

## 2015-10-08 DIAGNOSIS — I450 Right fascicular block: Secondary | ICD-10-CM

## 2015-10-08 MED ORDER — ASPIRIN EC 81 MG PO TBEC: 81.0000 mg | DELAYED_RELEASE_TABLET | Freq: Every day | ORAL | Status: DC

## 2015-10-08 NOTE — Progress Notes (Signed)
Cardiology Office Note:    Date:  10/08/2015   ID:  [name], DOB [birth year], MRN [id number]  PCP:  Thressa Sheller, MD  Cardiologist:  Dr. Ena Dawley   Electrophysiologist:  n/a  Referring MD: Thressa Sheller, MD   Chief complain: Follow-up for paroxysmal A. fib  History of Present Illness:     Carl Humphrey is a 60 y.o. male with a hx of HTN, hypertriglyceridemia, OSA on CPAP, hypothyroidism, anxiety, GERD, tobacco abuse, heavy ETOH use.  He was evaluated by Ignacia Bayley, NP on 08/13/15 for AF with RVR.  He was seen in conjuction with Dr. Ena Dawley.  CHADS2-VASc=1 (HTN).  He was placed on Metoprolol Tartrate for rate control.  He was placed on Apixaban with an eye towards DCCV if her remains in AFib after 3 weeks of uninterrupted anticoagulation.    TSH was normal. Echocardiogram demonstrated normal LV function.   Returns for FU.  He is back in NSR.  He is not aware of any changes in his symptoms.  He is not certain he has had any palpitations.  Denies chest pain, dyspnea, syncope, orthopnea, PND, edema.  Denies any bleeding issues.    10/08/2015 - patient is coming after 6 weeks, he remains in sinus rhythm EKG today, he doesn't feel any palpitations no syncope no dizziness. He is compliant with his medicines and doesn't feel any side effects. He denies any bleeding. No chest pain or shortness of breath.  Past Medical History  Diagnosis Date  . GERD (gastroesophageal reflux disease)   . Hypertensive heart disease   . Hypothyroidism   . Anxiety   . Hypertriglyceridemia   . Obstructive sleep apnea     a. On CPAP x ~ 10 yrs.  [name] ETOH abuse   . Atrial fibrillation ([place])     a. Dx 08/13/2015;  b. CHA2DS2VASc = 1.    Past Surgical History  Procedure Laterality Date  . No prior surgery      Current Medications: Outpatient Prescriptions Prior to Visit  Medication Sig Dispense Refill  . ALPRAZolam (XANAX) 0.5 MG tablet Take 0.5 mg by mouth daily.   3  . amLODipine  (NORVASC) 5 MG tablet Take 5 mg by mouth daily.  0  . azithromycin (ZITHROMAX) 250 MG tablet Take 250 mg by mouth once a week.   0  . Chromium Picolinate 800 MCG TABS Take by mouth daily.    [name] esomeprazole (NEXIUM) 40 MG packet Take 40 mg by mouth daily before breakfast.    . fenofibrate 160 MG tablet Take 160 mg by mouth daily.     . hydrochlorothiazide (HYDRODIURIL) 25 MG tablet Take 25 mg by mouth daily.     [name] levothyroxine (SYNTHROID, LEVOTHROID) 50 MCG tablet Take 50 mcg by mouth daily before breakfast.    . metoprolol (LOPRESSOR) 50 MG tablet Take 1 tablet (50 mg total) by mouth 2 (two) times daily. 180 tablet 3  . Multiple Vitamins-Minerals (MULTIVITAMIN PO) Take by mouth daily.    . Omega-3 Fatty Acids (FISH OIL) 1000 MG CAPS Take by mouth daily.    . valsartan (DIOVAN) 320 MG tablet Take 320 mg by mouth daily.    [name] venlafaxine XR (EFFEXOR-XR) 150 MG 24 hr capsule Take 150 mg by mouth daily.  4  . zolpidem (AMBIEN CR) 12.5 MG CR tablet Take 12.5 mg by mouth at bedtime as needed for sleep.    [name] apixaban (ELIQUIS) 5 MG TABS tablet Take 1 tablet (5 mg total) by  mouth 2 (two) times daily. 180 tablet 3   No facility-administered medications prior to visit.     Allergies:   Review of patient's allergies indicates no known allergies.   Social History   Social History  . Marital Status: Unknown    Spouse Name: N/A  . Number of Children: N/A  . Years of Education: N/A   Social History Main Topics  . Smoking status: Former Smoker    Quit date: 09/18/1997  . Smokeless tobacco: Former Systems developer    Quit date: 09/18/1997  . Alcohol Use: Yes     Comment: He drinks 8-10 beers/day.  . Drug Use: Yes    Special: Marijuana     Comment: uses marijuana 1 x month  . Sexual Activity: Not Asked   Other Topics Concern  . None   Social History Narrative   Lives in [place] with his wife.  He walks his dog about 2.5 miles/day w/o limitations.  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     Family History:  The patient's family history includes Hypertension in his father and mother; Multiple sclerosis in his sister; Other in his sister and sister. There is no history of Colon cancer, Heart attack, or Stroke.   ROS:   Please see the history of present illness.    Review of Systems  Constitution: Positive for malaise/fatigue.  Psychiatric/Behavioral: Positive for depression.  All other systems reviewed and are negative.   Physical Exam:    VS:  BP 132/80 mmHg  Pulse 72  Ht 5\' 7"  (1.702 m)  Wt 228 lb (103.42 kg)  BMI 35.70 kg/m2   GEN: Well nourished, well developed, in no acute distress HEENT: normal Neck: no JVD, no masses Cardiac: Normal S1/S2,  RRR; no murmurs, rubs, or gallops, no edema  Respiratory:  clear to auscultation bilaterally; no wheezing, rhonchi or rales GI: soft, nontender, nondistended MS: no deformity or atrophy Skin: warm and dry Neuro: No focal deficits  Psych: Alert and oriented x 3, normal affect  Wt Readings from Last 3 Encounters:  10/08/15 228 lb (103.42 kg)  09/03/15 227 lb 12.8 oz (103.329 kg)  08/13/15 225 lb 6.4 oz (102.241 kg)      Studies/Labs Reviewed:     EKG:  EKG is  ordered today.  The ekg ordered today demonstrates Sinus bradycardia with ventricular rate 58 bpm, iRBBB, unchanged from prior.  Recent Labs: 08/13/2015: BUN 12; Creat 0.75; Hemoglobin 16.5; Magnesium 1.8; Platelets 196; Potassium 4.1; Sodium 139; TSH 2.21   Recent Lipid Panel No results found for: CHOL, TRIG, HDL, CHOLHDL, VLDL, LDLCALC, LDLDIRECT  Additional studies/ records that were reviewed today include:   Echocardiogram 08/30/15 EF 60-65%, normal wall motion, mild LAE, no MR    ASSESSMENT:     1. PAF (paroxysmal atrial fibrillation) (HCC)     PLAN:     In order of problems listed above:  1. PAF - He remains in sinus rhythm, he was not symptomatic with A. fib, CHADS2-VASc=1.  Therefore he does not need long term  anticoagulation.  we will discontinue Eliquis today and start baby aspirin only. We will continue  Metoprolol Tartrate at his current dose.   he works in [employer], his advice to avoid excessive drinking at the same time.   2. HTN - ControlleI would continue the same management.  3.  hyperlipidemia  - followed by his primary care physician.   4. Hypothyroidism - Recent TSH normal.  5. OSA -  Continue CPAP.  6. ETOH - He knows to limit ETOH to reduce risk of AFib.   Medication Adjustments/Labs and Tests Ordered: Current medicines are reviewed at length with the patient today.  Concerns regarding medicines are outlined above.  Medication changes, Labs and Tests ordered today are outlined in the Patient Instructions noted below. Patient Instructions  Medication Instructions:   STOP TAKING ELIQUIS NOW  START TAKING ASPIRIN 81 MG ONCE DAILY     Follow-Up:  Your physician wants you to follow-up in: [place] will receive a reminder letter in the mail two months in advance. If you don't receive a letter, please call our office to schedule the follow-up appointment.       If you need a refill on your cardiac medications before your next appointment, please call your pharmacy.     Signed, Ena Dawley, MD  10/08/2015 11:32 AM    [facility] [address], [place], [place]  [address] Phone: [phone]; Fax: [phone]

## 2015-10-08 NOTE — Patient Instructions (Signed)
Medication Instructions:   STOP TAKING ELIQUIS NOW  START TAKING ASPIRIN 81 MG ONCE DAILY     Follow-Up:  Your physician wants you to follow-up in: [place] will receive a reminder letter in the mail two months in advance. If you don't receive a letter, please call our office to schedule the follow-up appointment.       If you need a refill on your cardiac medications before your next appointment, please call your pharmacy.

## 2015-10-13 NOTE — Addendum Note (Signed)
Addended by: Freada Bergeron on: 10/13/2015 04:18 PM   Modules accepted: Orders

## 2016-05-22 DIAGNOSIS — J189 Pneumonia, unspecified organism: Secondary | ICD-10-CM

## 2016-05-22 HISTORY — DX: Pneumonia, unspecified organism: J18.9

## 2016-11-29 ENCOUNTER — Other Ambulatory Visit: Payer: Self-pay | Admitting: Nurse Practitioner

## 2016-12-28 ENCOUNTER — Other Ambulatory Visit: Payer: Self-pay | Admitting: Nurse Practitioner

## 2017-01-16 ENCOUNTER — Other Ambulatory Visit: Payer: Self-pay | Admitting: Cardiology

## 2017-03-22 ENCOUNTER — Encounter: Payer: Self-pay | Admitting: Cardiology

## 2017-03-22 ENCOUNTER — Encounter (INDEPENDENT_AMBULATORY_CARE_PROVIDER_SITE_OTHER): Payer: Self-pay

## 2017-03-22 ENCOUNTER — Ambulatory Visit (INDEPENDENT_AMBULATORY_CARE_PROVIDER_SITE_OTHER): Payer: BLUE CROSS/BLUE SHIELD | Admitting: Cardiology

## 2017-03-22 VITALS — BP 126/74 | HR 72 | Ht 67.0 in | Wt 226.0 lb

## 2017-03-22 DIAGNOSIS — R0609 Other forms of dyspnea: Secondary | ICD-10-CM | POA: Diagnosis not present

## 2017-03-22 DIAGNOSIS — I480 Paroxysmal atrial fibrillation: Secondary | ICD-10-CM

## 2017-03-22 DIAGNOSIS — F1010 Alcohol abuse, uncomplicated: Secondary | ICD-10-CM | POA: Diagnosis not present

## 2017-03-22 DIAGNOSIS — Z23 Encounter for immunization: Secondary | ICD-10-CM

## 2017-03-22 DIAGNOSIS — I119 Hypertensive heart disease without heart failure: Secondary | ICD-10-CM | POA: Diagnosis not present

## 2017-03-22 NOTE — Patient Instructions (Signed)
Medication Instructions:   Your physician recommends that you continue on your current medications as directed. Please refer to the Current Medication list given to you today.    Testing/Procedures:  CORONARY CT WITH FFR FOR DR Meda Coffee TO READ   Please arrive at the [facility] main entrance of [facility]  (30-45 minutes prior to test start time)  [facility] [address] [place], [place] [address] [phone]  Proceed to the [facility] Radiology Department (First Floor).  Please follow these instructions carefully (unless otherwise directed):   On the Night Before the Test: . Drink plenty of water. . Do not consume any caffeinated/decaffeinated beverages or chocolate 12 hours prior to your test. . Do not take any antihistamines 12 hours prior to your test.    On the Day of the Test: . Drink plenty of water. Do not drink any water within one hour of the test. . Do not eat any food 4 hours prior to the test. . You may take your regular medications prior to the test. . IF NOT ON A BETA BLOCKER - Take 50 mg of lopressor (metoprolol) one hour before the test.   After the Test: . Drink plenty of water. . After receiving IV contrast, you may experience a mild flushed feeling. This is normal. . On occasion, you may experience a mild rash up to 24 hours after the test. This is not dangerous. If this occurs, you can take Benadryl 25 mg and increase your fluid intake. . If you experience trouble breathing, this can be serious. If it is severe call 911 IMMEDIATELY. If it is mild, please call our office.    Follow-Up:  Your physician wants you to follow-up in: [place] will receive a reminder letter in the mail two months in advance. If you don't receive a letter, please call our office to schedule the follow-up appointment.     If you need a refill on your cardiac medications before your next appointment, please call your  pharmacy.

## 2017-03-22 NOTE — Progress Notes (Signed)
Cardiology Office Note:    Date:  03/22/2017   ID:  Carl Humphrey, DOB Oct 12, 1955, MRN [id number]  PCP:  Carl Sheller, MD  Cardiologist:  Dr. Ena Dawley   Electrophysiologist:  n/a  Referring MD: Carl Sheller, MD   Chief complain: Follow-up for paroxysmal A. fib  History of Present Illness:     Carl Humphrey is a 61 y.o. male with a hx of HTN, hypertriglyceridemia, OSA on CPAP, hypothyroidism, anxiety, GERD, tobacco abuse, heavy ETOH use.  He was evaluated by Carl Bayley, NP on 08/13/15 for AF with RVR.  He was seen in conjuction with Dr. Ena Dawley.  CHADS2-VASc=1 (HTN).  He was placed on Metoprolol Tartrate for rate control.  He was placed on Apixaban with an eye towards DCCV if her remains in AFib after 3 weeks of uninterrupted anticoagulation.    TSH was normal. Echocardiogram demonstrated normal LV function.   Returns for FU.  He is back in NSR.  He is not aware of any changes in his symptoms.  He is not certain he has had any palpitations.  Denies chest pain, dyspnea, syncope, orthopnea, PND, edema.  Denies any bleeding issues.    10/08/2015 - patient is coming after 6 weeks, he remains in sinus rhythm EKG today, he doesn't feel any palpitations no syncope no dizziness. He is compliant with his medicines and doesn't feel any side effects. He denies any bleeding. No chest pain or shortness of breath.  March 22, 2017, the patient is coming after a year and a half, he has not had any palpitations dizziness or syncope since the last visit.  He has been compliant with his medications and has no side effects.  He states that he has been struggling to lose weight and has noticed that he gets short of breath with mild exertion.  Denies any chest pain.  He has slightly puffy legs but no significant edema no orthopnea proximal nocturnal dyspnea.  His legs are slightly puffy but no significant edema orthopnea or paroxysmal nocturnal dyspnea.  Past Medical History:  Diagnosis  Date  . Anxiety   . Atrial fibrillation ([place])    a. Dx 08/13/2015;  b. CHA2DS2VASc = 1.  . ETOH abuse   . GERD (gastroesophageal reflux disease)   . Hypertensive heart disease   . Hypertriglyceridemia   . Hypothyroidism   . Obstructive sleep apnea    a. On CPAP x ~ 10 yrs.    Past Surgical History:  Procedure Laterality Date  . no prior surgery      Current Medications: Outpatient Medications Prior to Visit  Medication Sig Dispense Refill  . aspirin EC 81 MG tablet Take 1 tablet (81 mg total) by mouth daily. 90 tablet 3  . fenofibrate 160 MG tablet Take 160 mg by mouth daily.     [name] levothyroxine (SYNTHROID, LEVOTHROID) 50 MCG tablet Take 50 mcg by mouth daily before breakfast.    . metoprolol tartrate (LOPRESSOR) 50 MG tablet Take 1 tablet (50 mg total) by mouth 2 (two) times daily. Please keep 03/22/17 appt for future authorization 60 tablet 2  . Multiple Vitamins-Minerals (MULTIVITAMIN PO) Take by mouth daily.    . Omega-3 Fatty Acids (FISH OIL) 1000 MG CAPS Take by mouth daily.    . valsartan (DIOVAN) 320 MG tablet Take 320 mg by mouth daily.    [name] venlafaxine XR (EFFEXOR-XR) 150 MG 24 hr capsule Take 150 mg by mouth daily.  4  . zolpidem (AMBIEN CR) 12.5 MG CR  tablet Take 12.5 mg by mouth at bedtime as needed for sleep.    [name] ALPRAZolam (XANAX) 0.5 MG tablet Take 0.5 mg by mouth daily.   3  . amLODipine (NORVASC) 5 MG tablet Take 5 mg by mouth daily.  0  . azithromycin (ZITHROMAX) 250 MG tablet Take 250 mg by mouth once a week.   0  . Chromium Picolinate 800 MCG TABS Take by mouth daily.    [name] esomeprazole (NEXIUM) 40 MG packet Take 40 mg by mouth daily before breakfast.    . hydrochlorothiazide (HYDRODIURIL) 25 MG tablet Take 25 mg by mouth daily.      No facility-administered medications prior to visit.      Allergies:   Patient has no known allergies.   Social History   Social History  . Marital status: Unknown    Spouse name: N/A  . Number of children: N/A  . Years  of education: N/A   Social History Main Topics  . Smoking status: Former Smoker    Quit date: 09/18/1997  . Smokeless tobacco: Former Systems developer    Quit date: 09/18/1997  . Alcohol use Yes     Comment: He drinks 8-10 beers/day.  . Drug use: Yes    Types: Marijuana     Comment: uses marijuana 1 x month  . Sexual activity: Not Asked   Other Topics Concern  . None   Social History Narrative   Lives in [place] with his wife.  He walks his dog about 2.5 miles/day w/o limitations.  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     Family History:  The patient's family history includes Hypertension in his father and mother; Multiple sclerosis in his sister; Other in his sister and sister.   ROS:   Please see the history of present illness.    Review of Systems  Constitution: Positive for malaise/fatigue.  Psychiatric/Behavioral: Positive for depression.  All other systems reviewed and are negative.   Physical Exam:    VS:  BP 126/74   Pulse 72   Ht 5\' 7"  (1.702 m)   Wt 226 lb (102.5 kg)   BMI 35.40 kg/m    GEN: Well nourished, well developed, in no acute distress  HEENT: normal  Neck: no JVD, no masses Cardiac: Normal S1/S2,  RRR; no murmurs, rubs, or gallops, no edema  Respiratory:  clear to auscultation bilaterally; no wheezing, rhonchi or rales GI: soft, nontender, nondistended MS: no deformity or atrophy  Skin: warm and dry Neuro: No focal deficits  Psych: Alert and oriented x 3, normal affect  Wt Readings from Last 3 Encounters:  03/22/17 226 lb (102.5 kg)  10/08/15 228 lb (103.4 kg)  09/03/15 227 lb 12.8 oz (103.3 kg)      Studies/Labs Reviewed:     EKG:  EKG is  ordered today.  This was personally reviewed.  It shows normal sinus rhythm with incomplete right bundle branch block otherwise normal and unchanged from prior  Recent Labs: No results found for requested labs within last 8760 hours.   Recent Lipid Panel No results found for: CHOL,  TRIG, HDL, CHOLHDL, VLDL, LDLCALC, LDLDIRECT  Additional studies/ records that were reviewed today include:   Echocardiogram 08/30/15 EF 60-65%, normal wall motion, mild LAE, no MR    ASSESSMENT:     1. PAF (paroxysmal atrial fibrillation) ([place])   2. DOE (dyspnea on exertion)   3. Need for immunization against influenza   4. Hypertensive heart disease  without heart failure   5. ETOH abuse     PLAN:     In order of problems listed above:  1. PAF - He remains in sinus rhythm, he was not symptomatic with A. fib, CHADS2-VASc=1.  Therefore he does not need long term anticoagulation.  Continue aspirin 81 mg daily.  2.  Dyspnea on exertion -that is progressively worsening, I will obtain coronary CTA to further evaluate for possible coronary artery disease.  3. HTN -controlled on current regimen.    4.  Hyperlipidemia, followed by his primary care physician, he has elevated triglycerides but normal LDL might be artificially low, if he is coronary CT is abnormal I will start on statin.  5. Hypothyroidism - Recent TSH normal.  Followed by primary care physician  6.. OSA - Continue CPAP.  7. ETOH - He knows to limit ETOH to reduce risk of AFib.   Medication Adjustments/Labs and Tests Ordered: Current medicines are reviewed at length with the patient today.  Concerns regarding medicines are outlined above.  Medication changes, Labs and Tests ordered today are outlined in the Patient Instructions noted below. Patient Instructions  Medication Instructions:   Your physician recommends that you continue on your current medications as directed. Please refer to the Current Medication list given to you today.    Testing/Procedures:  CORONARY CT WITH FFR FOR DR Meda Coffee TO READ   Please arrive at the [facility] main entrance of [facility]  (30-45 minutes prior to test start time)  [facility] [address] [place], [place] [address] [phone]  Proceed to  the [facility] Radiology Department (First Floor).  Please follow these instructions carefully (unless otherwise directed):   On the Night Before the Test: . Drink plenty of water. . Do not consume any caffeinated/decaffeinated beverages or chocolate 12 hours prior to your test. . Do not take any antihistamines 12 hours prior to your test.    On the Day of the Test: . Drink plenty of water. Do not drink any water within one hour of the test. . Do not eat any food 4 hours prior to the test. . You may take your regular medications prior to the test. . IF NOT ON A BETA BLOCKER - Take 50 mg of lopressor (metoprolol) one hour before the test.   After the Test: . Drink plenty of water. . After receiving IV contrast, you may experience a mild flushed feeling. This is normal. . On occasion, you may experience a mild rash up to 24 hours after the test. This is not dangerous. If this occurs, you can take Benadryl 25 mg and increase your fluid intake. . If you experience trouble breathing, this can be serious. If it is severe call 911 IMMEDIATELY. If it is mild, please call our office.    Follow-Up:  Your physician wants you to follow-up in: [place] will receive a reminder letter in the mail two months in advance. If you don't receive a letter, please call our office to schedule the follow-up appointment.     If you need a refill on your cardiac medications before your next appointment, please call your pharmacy.    Signed, Ena Dawley, MD  03/22/2017 4:22 PM    [facility] [address], [place], [place]  [address] Phone: [phone]; Fax: [phone]

## 2017-04-09 ENCOUNTER — Telehealth: Payer: Self-pay | Admitting: *Deleted

## 2017-04-09 DIAGNOSIS — I480 Paroxysmal atrial fibrillation: Secondary | ICD-10-CM

## 2017-04-09 DIAGNOSIS — I4891 Unspecified atrial fibrillation: Secondary | ICD-10-CM

## 2017-04-09 DIAGNOSIS — E785 Hyperlipidemia, unspecified: Secondary | ICD-10-CM

## 2017-04-09 NOTE — Telephone Encounter (Signed)
Order for BMET placed for this pt to come and have done on 12/3, per coronary ct protocol.  Pt made aware of lab appt by [facility] Scheduling.

## 2017-04-12 ENCOUNTER — Other Ambulatory Visit: Payer: Self-pay | Admitting: Cardiology

## 2017-04-23 ENCOUNTER — Other Ambulatory Visit: Payer: BLUE CROSS/BLUE SHIELD

## 2017-04-25 ENCOUNTER — Ambulatory Visit (HOSPITAL_COMMUNITY): Payer: BLUE CROSS/BLUE SHIELD

## 2017-09-21 ENCOUNTER — Emergency Department (HOSPITAL_COMMUNITY): Payer: BC Managed Care – PPO

## 2017-09-21 ENCOUNTER — Other Ambulatory Visit: Payer: Self-pay

## 2017-09-21 ENCOUNTER — Inpatient Hospital Stay (HOSPITAL_COMMUNITY)
Admission: EM | Admit: 2017-09-21 | Discharge: 2017-09-23 | DRG: 194 | Disposition: A | Discharge status: Home / Self Care | Payer: BC Managed Care – PPO | Attending: Internal Medicine | Admitting: Internal Medicine

## 2017-09-21 ENCOUNTER — Encounter (HOSPITAL_COMMUNITY): Payer: Self-pay

## 2017-09-21 DIAGNOSIS — I4891 Unspecified atrial fibrillation: Secondary | ICD-10-CM | POA: Diagnosis not present

## 2017-09-21 DIAGNOSIS — G4733 Obstructive sleep apnea (adult) (pediatric): Secondary | ICD-10-CM | POA: Diagnosis present

## 2017-09-21 DIAGNOSIS — R0602 Shortness of breath: Secondary | ICD-10-CM | POA: Diagnosis present

## 2017-09-21 DIAGNOSIS — Z7982 Long term (current) use of aspirin: Secondary | ICD-10-CM

## 2017-09-21 DIAGNOSIS — R0789 Other chest pain: Secondary | ICD-10-CM | POA: Diagnosis present

## 2017-09-21 DIAGNOSIS — I739 Peripheral vascular disease, unspecified: Secondary | ICD-10-CM | POA: Diagnosis present

## 2017-09-21 DIAGNOSIS — I482 Chronic atrial fibrillation: Secondary | ICD-10-CM | POA: Diagnosis present

## 2017-09-21 DIAGNOSIS — I4510 Unspecified right bundle-branch block: Secondary | ICD-10-CM | POA: Diagnosis present

## 2017-09-21 DIAGNOSIS — R079 Chest pain, unspecified: Secondary | ICD-10-CM

## 2017-09-21 DIAGNOSIS — I340 Nonrheumatic mitral (valve) insufficiency: Secondary | ICD-10-CM | POA: Diagnosis not present

## 2017-09-21 DIAGNOSIS — J90 Pleural effusion, not elsewhere classified: Secondary | ICD-10-CM | POA: Diagnosis present

## 2017-09-21 DIAGNOSIS — R0600 Dyspnea, unspecified: Secondary | ICD-10-CM | POA: Diagnosis not present

## 2017-09-21 DIAGNOSIS — E781 Pure hyperglyceridemia: Secondary | ICD-10-CM | POA: Diagnosis present

## 2017-09-21 DIAGNOSIS — J181 Lobar pneumonia, unspecified organism: Secondary | ICD-10-CM | POA: Diagnosis not present

## 2017-09-21 DIAGNOSIS — K219 Gastro-esophageal reflux disease without esophagitis: Secondary | ICD-10-CM | POA: Diagnosis present

## 2017-09-21 DIAGNOSIS — J811 Chronic pulmonary edema: Secondary | ICD-10-CM | POA: Diagnosis present

## 2017-09-21 DIAGNOSIS — Z8249 Family history of ischemic heart disease and other diseases of the circulatory system: Secondary | ICD-10-CM

## 2017-09-21 DIAGNOSIS — E8770 Fluid overload, unspecified: Secondary | ICD-10-CM | POA: Diagnosis present

## 2017-09-21 DIAGNOSIS — R233 Spontaneous ecchymoses: Secondary | ICD-10-CM | POA: Diagnosis present

## 2017-09-21 DIAGNOSIS — Z79890 Hormone replacement therapy (postmenopausal): Secondary | ICD-10-CM

## 2017-09-21 DIAGNOSIS — I119 Hypertensive heart disease without heart failure: Secondary | ICD-10-CM | POA: Diagnosis present

## 2017-09-21 DIAGNOSIS — F419 Anxiety disorder, unspecified: Secondary | ICD-10-CM | POA: Diagnosis present

## 2017-09-21 DIAGNOSIS — I480 Paroxysmal atrial fibrillation: Secondary | ICD-10-CM | POA: Diagnosis present

## 2017-09-21 DIAGNOSIS — F1010 Alcohol abuse, uncomplicated: Secondary | ICD-10-CM | POA: Diagnosis present

## 2017-09-21 DIAGNOSIS — J189 Pneumonia, unspecified organism: Secondary | ICD-10-CM | POA: Diagnosis present

## 2017-09-21 DIAGNOSIS — E039 Hypothyroidism, unspecified: Secondary | ICD-10-CM | POA: Diagnosis present

## 2017-09-21 DIAGNOSIS — R531 Weakness: Secondary | ICD-10-CM | POA: Diagnosis not present

## 2017-09-21 DIAGNOSIS — E785 Hyperlipidemia, unspecified: Secondary | ICD-10-CM | POA: Diagnosis present

## 2017-09-21 DIAGNOSIS — Z23 Encounter for immunization: Secondary | ICD-10-CM | POA: Diagnosis not present

## 2017-09-21 DIAGNOSIS — I5031 Acute diastolic (congestive) heart failure: Secondary | ICD-10-CM | POA: Diagnosis not present

## 2017-09-21 DIAGNOSIS — Z87891 Personal history of nicotine dependence: Secondary | ICD-10-CM

## 2017-09-21 DIAGNOSIS — Z79899 Other long term (current) drug therapy: Secondary | ICD-10-CM

## 2017-09-21 LAB — COMPREHENSIVE METABOLIC PANEL
ALT: 32 U/L (ref 17–63)
AST: 64 U/L — AB (ref 15–41)
Albumin: 3.7 g/dL (ref 3.5–5.0)
Alkaline Phosphatase: 61 U/L (ref 38–126)
Anion gap: 11 (ref 5–15)
BILIRUBIN TOTAL: 0.7 mg/dL (ref 0.3–1.2)
BUN: 15 mg/dL (ref 6–20)
CHLORIDE: 106 mmol/L (ref 101–111)
CO2: 27 mmol/L (ref 22–32)
Calcium: 9.8 mg/dL (ref 8.9–10.3)
Creatinine, Ser: 0.94 mg/dL (ref 0.61–1.24)
GFR calc Af Amer: >60 mL/min (ref >60)
Glucose, Bld: 113 mg/dL — ABNORMAL HIGH (ref 65–99)
POTASSIUM: 4 mmol/L (ref 3.5–5.1)
Sodium: 144 mmol/L (ref 135–145)
TOTAL PROTEIN: 7.8 g/dL (ref 6.5–8.1)

## 2017-09-21 LAB — CBC WITH DIFFERENTIAL/PLATELET
BASOS PCT: 0 %
Basophils Absolute: 0 10*3/uL (ref 0.0–0.1)
Eosinophils Absolute: 0.1 10*3/uL (ref 0.0–0.7)
Eosinophils Relative: 1 %
HEMATOCRIT: 42.4 % (ref 39.0–52.0)
HEMOGLOBIN: 13.9 g/dL (ref 13.0–17.0)
Lymphocytes Relative: 15 %
Lymphs Abs: 1.4 10*3/uL (ref 0.7–4.0)
MCH: 30.3 pg (ref 26.0–34.0)
MCHC: 32.8 g/dL (ref 30.0–36.0)
MCV: 92.6 fL (ref 78.0–100.0)
MONOS PCT: 7 %
Monocytes Absolute: 0.6 10*3/uL (ref 0.1–1.0)
NEUTROS ABS: 6.7 10*3/uL (ref 1.7–7.7)
NEUTROS PCT: 77 %
Platelets: 199 10*3/uL (ref 150–400)
RBC: 4.58 MIL/uL (ref 4.22–5.81)
RDW: 13.6 % (ref 11.5–15.5)
WBC: 8.8 10*3/uL (ref 4.0–10.5)

## 2017-09-21 LAB — TROPONIN I

## 2017-09-21 LAB — RAPID URINE DRUG SCREEN, HOSP PERFORMED
AMPHETAMINES: NOT DETECTED
BENZODIAZEPINES: NOT DETECTED
Barbiturates: NOT DETECTED
COCAINE: NOT DETECTED
OPIATES: NOT DETECTED
TETRAHYDROCANNABINOL: NOT DETECTED

## 2017-09-21 LAB — URINALYSIS, ROUTINE W REFLEX MICROSCOPIC
Bilirubin Urine: NEGATIVE
Glucose, UA: NEGATIVE mg/dL
Hgb urine dipstick: NEGATIVE
Ketones, ur: NEGATIVE mg/dL
LEUKOCYTES UA: NEGATIVE
NITRITE: NEGATIVE
PH: 6 (ref 5.0–8.0)
Protein, ur: NEGATIVE mg/dL
SPECIFIC GRAVITY, URINE: 1.02 (ref 1.005–1.030)

## 2017-09-21 LAB — PROTIME-INR
INR: 1.25
Prothrombin Time: 15.6 seconds — ABNORMAL HIGH (ref 11.4–15.2)

## 2017-09-21 LAB — LIPASE, BLOOD: LIPASE: 38 U/L (ref 11–51)

## 2017-09-21 LAB — I-STAT TROPONIN, ED: Troponin i, poc: 0 ng/mL (ref 0.00–0.08)

## 2017-09-21 LAB — BRAIN NATRIURETIC PEPTIDE: B Natriuretic Peptide: 359.7 pg/mL — ABNORMAL HIGH (ref 0.0–100.0)

## 2017-09-21 LAB — ETHANOL: Alcohol, Ethyl (B): <10 mg/dL (ref <10)

## 2017-09-21 MED ORDER — ASPIRIN 81 MG PO CHEW
324.0000 mg | CHEWABLE_TABLET | Freq: Once | ORAL | Status: AC
  Administered 2017-09-21: 324 mg via ORAL
  Filled 2017-09-21: qty 4

## 2017-09-21 MED ORDER — IOPAMIDOL (ISOVUE-370) INJECTION 76%
INTRAVENOUS | Status: AC
  Filled 2017-09-21: qty 100

## 2017-09-21 MED ORDER — FUROSEMIDE 10 MG/ML IJ SOLN
40.0000 mg | Freq: Once | INTRAMUSCULAR | Status: AC
  Administered 2017-09-21: 40 mg via INTRAVENOUS
  Filled 2017-09-21: qty 4

## 2017-09-21 MED ORDER — SODIUM CHLORIDE 0.9 % IJ SOLN
INTRAMUSCULAR | Status: AC
  Filled 2017-09-21: qty 50

## 2017-09-21 MED ORDER — IOPAMIDOL (ISOVUE-370) INJECTION 76%
100.0000 mL | Freq: Once | INTRAVENOUS | Status: AC | PRN
  Administered 2017-09-21: 100 mL via INTRAVENOUS

## 2017-09-21 NOTE — ED Provider Notes (Signed)
[facility] DEPT Provider Note   CSN: [id number] Arrival date & time: 09/21/17  1513     History   Chief Complaint Chief Complaint  Patient presents with  . Shortness of Breath    HPI Carl Humphrey is a 62 y.o. male.  HPI Reports that he had a really harsh cough peroxisomal 4 days ago.  He states his been short of breath since that time.  He is noticed himself to be more short of breath with exertion.  He reports he does have some aching discomfort on both sides of his chest.  He feels it is more musculoskeletal from the coughing that he had been doing earlier.  Cough is been nonproductive (triage notes productive but patient denied).  No fevers no chills.  He does note some swelling in his legs.  He thinks he typically gets a small amount.  Patient denies he is ever been treated with Lasix in the past.  He does report he has taken metoprolol for atrial fibrillation.  He reports he is never been symptomatic when he has had atrial fibrillation.  Reports he did have a beach trip last week.  He denies he was symptomatic at that time.  Symptoms started after he got home.  He reports he had had about a 10 pound weight gain at that time and is now gone back down to just about 5 pounds above normal weight. Past Medical History:  Diagnosis Date  . Anxiety   . Atrial fibrillation ([place])    a. Dx 08/13/2015;  b. CHA2DS2VASc = 1.  . ETOH abuse   . GERD (gastroesophageal reflux disease)   . Hypertensive heart disease   . Hypertriglyceridemia   . Hypothyroidism   . Obstructive sleep apnea    a. On CPAP x ~ 10 yrs.    Patient Active Problem List   Diagnosis Date Noted  . CAP (community acquired pneumonia) 09/21/2017  . Chest pain 09/21/2017  . Pleural effusion 09/21/2017  . PAF (paroxysmal atrial fibrillation) ([place]) 10/08/2015  . Encounter for long-term (current) use of other high-risk medications 10/08/2015  . Incomplete RBBB 10/08/2015  . Hypertensive heart  disease   . Hyperlipidemia   . Obstructive sleep apnea   . Hypothyroidism   . GERD (gastroesophageal reflux disease)   . ETOH abuse   . Atrial fibrillation ([place])   . Hypertriglyceridemia     Past Surgical History:  Procedure Laterality Date  . no prior surgery          Home Medications    Prior to Admission medications   Medication Sig Start Date End Date Taking? Authorizing Provider  ALPRAZolam Duanne Moron) 0.5 MG tablet Take 0.25-0.5 mg by mouth daily as needed. 09/05/17  Yes [provider]  aspirin EC 81 MG tablet Take 1 tablet (81 mg total) by mouth daily. 10/08/15  Yes Dorothy Spark, MD  Cholecalciferol (VITAMIN D PO) Take by mouth daily.   Yes [provider]  fenofibrate 160 MG tablet Take 160 mg by mouth daily.  09/28/12  Yes [provider]  levothyroxine (SYNTHROID, LEVOTHROID) 88 MCG tablet Take 88 mcg by mouth daily. 08/08/17  Yes [provider]  metoprolol tartrate (LOPRESSOR) 50 MG tablet TAKE 1 TABLET BY MOUTH TWICE A DAY 04/16/17  Yes Dorothy Spark, MD  Multiple Vitamins-Minerals (MULTIVITAMIN PO) Take by mouth daily.   Yes [provider]  Omega-3 Fatty Acids (FISH OIL) 1000 MG CAPS Take by mouth daily.   Yes  [provider]  pantoprazole (PROTONIX) 40 MG tablet Take 40 mg by mouth daily. 07/27/17  Yes [provider]  valsartan (DIOVAN) 320 MG tablet Take 320 mg by mouth daily.   Yes [provider]  venlafaxine XR (EFFEXOR-XR) 150 MG 24 hr capsule Take 300 mg by mouth daily.  08/20/15  Yes [provider]  zolpidem (AMBIEN) 10 MG tablet Take 10 mg by mouth at bedtime. 09/05/17  Yes [provider]    Family History Family History  Problem Relation Age of Onset  . Hypertension Mother        alive @ 48  . Hypertension Father        died of heart failure @ 45  . Multiple sclerosis Sister        deceased  . Other Sister        A & W  . Other Sister        A & W  .  Colon cancer Neg Hx   . Heart attack Neg Hx   . Stroke Neg Hx     Social History Social History   Tobacco Use  . Smoking status: Former Smoker    Last attempt to quit: 09/18/1997    Years since quitting: 20.0  . Smokeless tobacco: Former Systems developer    Quit date: 09/18/1997  Substance Use Topics  . Alcohol use: Yes    Comment: He drinks 8-10 beers/day.  . Drug use: Yes    Types: Marijuana    Comment: uses marijuana 1 x month     Allergies   Patient has no known allergies.   Review of Systems Review of Systems 10 Systems reviewed and are negative for acute change except as noted in the HPI.  Physical Exam Updated Vital Signs BP (!) 172/89   Pulse 64   Temp 97.8 F (36.6 C) (Oral)   Resp 17   Ht 5\' 7"  (1.702 m)   Wt 104.3 kg (230 lb)   SpO2 97%   BMI 36.02 kg/m   Physical Exam  Constitutional: He is oriented to person, place, and time. He appears well-developed and well-nourished. No distress.  HENT:  Head: Normocephalic and atraumatic.  Mouth/Throat: Oropharynx is clear and moist.  Eyes: EOM are normal.  Neck: Neck supple.  Cardiovascular: Normal rate, regular rhythm, normal heart sounds and intact distal pulses.  Pulmonary/Chest: Effort normal.  Fine basilar crackle.  Good airflow throughout.  Abdominal: Soft. Bowel sounds are normal. He exhibits no distension. There is no tenderness.  Musculoskeletal: Normal range of motion.  Trace pitting edema bilaterally.  Neurological: He is alert and oriented to person, place, and time. He has normal strength. He exhibits normal muscle tone. Coordination normal. GCS eye subscore is 4. GCS verbal subscore is 5. GCS motor subscore is 6.  Skin: Skin is warm, dry and intact.  Psychiatric: He has a normal mood and affect.     ED Treatments / Results  Labs (all labs ordered are listed, but only abnormal results are displayed) Labs Reviewed  COMPREHENSIVE METABOLIC PANEL - Abnormal; Notable for the following components:       Result Value   Glucose, Bld 113 (*)    AST 64 (*)    All other components within normal limits  BRAIN NATRIURETIC PEPTIDE - Abnormal; Notable for the following components:   B Natriuretic Peptide 359.7 (*)    All other components within normal limits  PROTIME-INR - Abnormal; Notable for the following components:  Prothrombin Time 15.6 (*)    All other components within normal limits  LIPASE, BLOOD  CBC WITH DIFFERENTIAL/PLATELET  URINALYSIS, ROUTINE W REFLEX MICROSCOPIC  TROPONIN I  RAPID URINE DRUG SCREEN, HOSP PERFORMED  ETHANOL  I-STAT TROPONIN, ED    EKG EKG Interpretation  Date/Time:  Friday Sep 21 2017 15:25:05 EDT Ventricular Rate:  67 PR Interval:  162 QRS Duration: 100 QT Interval:  404 QTC Calculation: 426 R Axis:   -21 Text Interpretation:  Normal sinus rhythm Incomplete right bundle branch block Borderline ECG agree. no acute ischemic changes Confirmed by Charlesetta Shanks [id number]) on 09/21/2017 7:45:59 PM   Radiology Dg Chest 2 View  Result Date: 09/21/2017 CLINICAL DATA:  Persistent cough and shortness of breath. EXAM: CHEST - 2 VIEW COMPARISON:  None. FINDINGS: The heart is at the upper limits of normal in size. Normal mediastinal contours. Mild increased peripheral interstitial markings at the lung bases. Trace bilateral pleural effusions. Right basilar atelectasis. No pneumothorax. No acute osseous abnormality. IMPRESSION: 1. Mild interstitial edema and trace bilateral pleural effusions. Electronically Signed   By: Titus Dubin M.D.   On: 09/21/2017 16:05   Ct Angio Chest Pe W/cm &/or Wo Cm  Result Date: 09/21/2017 CLINICAL DATA:  Shortness of breath for 3 days. EXAM: CT ANGIOGRAPHY CHEST WITH CONTRAST TECHNIQUE: Multidetector CT imaging of the chest was performed using the standard protocol during bolus administration of intravenous contrast. Multiplanar CT image reconstructions and MIPs were obtained to evaluate the vascular anatomy. CONTRAST:  161mL ISOVUE-370  IOPAMIDOL (ISOVUE-370) INJECTION 76% COMPARISON:  Two-view chest x-ray of the same day. FINDINGS: Cardiovascular: The heart size is normal. Coronary artery calcifications are present. Atherosclerotic changes are noted at the aortic arch. Great vessel origins are within normal limits. Pulmonary artery opacification is satisfactory. There are no focal filling defects to suggest pulmonary embolus. Mediastinum/Nodes: No significant mediastinal, hilar, or axillary adenopathy. Thoracic inlet is within normal limits. Visualized thyroid is unremarkable. Lungs/Pleura: Right greater than left pleural effusions are present. There is mild dependent atelectasis. Ill-defined right middle lobe airspace opacities are noted. Interlobular septal thickening is present in the right middle and right lower lobes. Mild dependent atelectasis is present on the left. Upper Abdomen: Limited imaging of the upper abdomen is unremarkable. Musculoskeletal: Vertebral body heights alignment are normal. No focal lytic or blastic lesions are present. Sternum is within normal limits. Ribs are unremarkable. Review of the MIP images confirms the above findings. IMPRESSION: 1. No pulmonary embolus. 2. Right greater than left pleural effusions. 3. Scattered right middle lobe airspace disease concerning for infection. 4. Smaller left pleural effusion and mild atelectasis. 5. Mild interlobular septal thickening suggesting some degree of edema. Electronically Signed   By: San Morelle M.D.   On: 09/21/2017 20:47    Procedures Procedures (including critical care time)  Medications Ordered in ED Medications  sodium chloride 0.9 % injection (has no administration in time range)  iopamidol (ISOVUE-370) 76 % injection (has no administration in time range)  iopamidol (ISOVUE-370) 76 % injection 100 mL (100 mLs Intravenous Contrast Given 09/21/17 2023)  aspirin chewable tablet 324 mg (324 mg Oral Given 09/21/17 2138)  furosemide (LASIX) injection 40  mg (40 mg Intravenous Given 09/21/17 2140)     Initial Impression / Assessment and Plan / ED Course  I have reviewed the triage vital signs and the nursing notes.  Pertinent labs & imaging results that were available during my care of the patient were reviewed by me and considered in  my medical decision making (see chart for details).    Consult: Reviewed with cardiology fellow Dr. Rana Snare.  We will plan to admit to hospitalist service and transferred to [facility] emergency department will consult. Consult: Dr. Roel Cluck tried hospitalist.  Will evaluate in the emergency department for admission.  Final Clinical Impressions(s) / ED Diagnoses   Final diagnoses:  Pleural effusion  Dyspnea, unspecified type  Exertional chest pain   Patient developed some acute shortness of breath 3 days ago.  He describes anterior chest\upper abdominal tightness with exertion.  There is concern for possible anginal symptoms.  Patient has significant comorbidities for coronary artery disease.  Troponin is negative and EKG does not show acute ischemia.  Patient is not having rest pain.  Stable for admission and further diagnostic evaluation. ED Discharge Orders    None       Charlesetta Shanks, MD 09/22/17 [phone]

## 2017-09-21 NOTE — H&P (Signed)
Carl Humphrey:[id number] DOB: 05/21/1956 DOA: 09/21/2017     PCP: Thressa Sheller, MD   Outpatient Specialists:   CARDS:   Dr. Meda Coffee   Patient arrived to ER on 09/21/17 at 1513  Patient coming from:   home Lives   With family    Chief Complaint:  Chief Complaint  Patient presents with  . Shortness of Breath    HPI: Carl Humphrey is a 62 y.o. male with medical history significant of A.fib  HTN, hypertriglyceridemia, OSA on CPAP, hypothyroidism, anxiety, GERD, tobacco abuse, heavy ETOH use, GERD     Presented with   3 days of exertional dyspnea. Reports he was coughing up phlegm and it would not come up he coughed a lot. His mother was in the hospital Sunday he is unsure with what.  Had significant cough and been sore thought maybe he pulled a muscle. Reports chest pain/abdominal pain worse with exertion.  Been having non-productive sputum for the past 3 days. Does have chronic lower extremity swelling. He reports his legs were getting tired after just  A bit of walking.  no fevers or chills.  Last travel history was to the beach last week.  He recently has gained 10 pounds but then was able to lose 5. This week if he lays down flat he feels short of breath and wheezing.  Denies history of coronary artery disease. Reports drinks 6 beers a day have been for years but reports no hx of et oh withdrawal last beer was yesterday.  Wife reports more fatigued.    Regarding pertinent Chronic problems: A.fib found in 2017 CHADS2-VASc=1 (HTN).  He was placed on Metoprolol Tartrate for rate control.   last echo 2017 normal EF  While in ER:  chest pain free  Following Medications were ordered in ER: Medications  sodium chloride 0.9 % injection (has no administration in time range)  iopamidol (ISOVUE-370) 76 % injection (has no administration in time range)  iopamidol (ISOVUE-370) 76 % injection 100 mL (100 mLs Intravenous Contrast Given 09/21/17 2023)  aspirin chewable tablet 324 mg  (324 mg Oral Given 09/21/17 2138)  furosemide (LASIX) injection 40 mg (40 mg Intravenous Given 09/21/17 2140)    Significant initial  Findings: Abnormal Labs Reviewed  COMPREHENSIVE METABOLIC PANEL - Abnormal; Notable for the following components:      Result Value   Glucose, Bld 113 (*)    AST 64 (*)    All other components within normal limits  BRAIN NATRIURETIC PEPTIDE - Abnormal; Notable for the following components:   B Natriuretic Peptide 359.7 (*)    All other components within normal limits  PROTIME-INR - Abnormal; Notable for the following components:   Prothrombin Time 15.6 (*)    All other components within normal limits     Na 144 K 4.0  Cr  Stable  Lab Results  Component Value Date   CREATININE 0.94 09/21/2017   CREATININE 0.75 08/13/2015      WBC  8.8  HG/HCT  Stable,     Component Value Date/Time   HGB 13.9 09/21/2017 1838   HCT 42.4 09/21/2017 1838    Troponin (Point of Care Test) Recent Labs    09/21/17 1843  TROPIPOC 0.00    BNP (last 3 results) Recent Labs    09/21/17 1838  BNP 359.7*    ProBNP (last 3 results) No results for input(s): PROBNP in the last 8760 hours.  Lactic Acid, Venous No results found for: LATICACIDVEN  UA   no evidence of UTI      CXR -mild interstitial edema bilateral trace effusions    CTa -left sided pleural effusion possible infiltrate  ECG:  Personally reviewed by me showing: HR : 68 Rhythm: NSR    no evidence of ischemic changes QTC 424    ED Triage Vitals  Enc Vitals Group     BP 09/21/17 1526 (!) 165/95     Pulse Rate 09/21/17 1526 88     Resp 09/21/17 1526 18     Temp 09/21/17 1526 97.8 F (36.6 C)     Temp Source 09/21/17 1526 Oral     SpO2 09/21/17 1526 99 %     Weight 09/21/17 1533 230 lb (104.3 kg)     Height 09/21/17 1533 5\' 7"  (1.702 m)     Head Circumference --      Peak Flow --      Pain Score 09/21/17 1533 7     Pain Loc --      Pain Edu? --      Excl. in [place]? --     TMAX(24)@       Latest  Blood pressure (!) 187/106, pulse 64, temperature 97.8 F (36.6 C), temperature source Oral, resp. rate 17, height 5\' 7"  (1.702 m), weight 104.3 kg (230 lb), SpO2 97 %.    ER Provider Called: Cardiology  They Recommend admission to [facility] possible cardiac work-up Will see in AM   Hospitalist was called for admission for dyspnea secondary to pneumonia pleural effusions versus CHF exacerbation   Review of Systems:    Pertinent positives include:  fatigue,chest pain shortness of breath at rest.  dyspnea on exertion, non-productive cough  Constitutional:  No weight loss, night sweats, Fevers, chills weight loss  HEENT:  No headaches, Difficulty swallowing,Tooth/dental problems,Sore throat,  No sneezing, itching, ear ache, nasal congestion, post nasal drip,  Cardio-vascular:  No , Orthopnea, PND, anasarca, dizziness, palpitations.no Bilateral lower extremity swelling  GI:  No heartburn, indigestion, abdominal pain, nausea, vomiting, diarrhea, change in bowel habits, loss of appetite, melena, blood in stool, hematemesis Resp:  no, No excess mucus, no productive cough, No, No coughing up of blood.No change in color of mucus.No wheezing. Skin:  no rash or lesions. No jaundice GU:  no dysuria, change in color of urine, no urgency or frequency. No straining to urinate.  No flank pain.  Musculoskeletal:  No joint pain or no joint swelling. No decreased range of motion. No back pain.  Psych:  No change in mood or affect. No depression or anxiety. No memory loss.  Neuro: no localizing neurological complaints, no tingling, no weakness, no double vision, no gait abnormality, no slurred speech, no confusion  As per HPI otherwise 10 point review of systems negative.   Past Medical History:   Past Medical History:  Diagnosis Date  . Anxiety   . Atrial fibrillation ([place])    a. Dx 08/13/2015;  b. CHA2DS2VASc = 1.  . ETOH abuse   . GERD (gastroesophageal  reflux disease)   . Hypertensive heart disease   . Hypertriglyceridemia   . Hypothyroidism   . Obstructive sleep apnea    a. On CPAP x ~ 10 yrs.       Past Surgical History:  Procedure Laterality Date  . no prior surgery      Social History:  Ambulatory   Independently      reports that he quit smoking about 20 years ago. He quit  smokeless tobacco use about 20 years ago. He reports that he drinks alcohol. He reports that he has current or past drug history. Drug: Marijuana.     Family History:   Family History  Problem Relation Age of Onset  . Hypertension Mother        alive @ 78  . Hypertension Father        died of heart failure @ 70  . Multiple sclerosis Sister        deceased  . Other Sister        A & W  . Other Sister        A & W  . Colon cancer Neg Hx   . Heart attack Neg Hx   . Stroke Neg Hx     Allergies: No Known Allergies   Prior to Admission medications   Medication Sig Start Date End Date Taking? Authorizing Provider  ALPRAZolam Duanne Moron) 0.5 MG tablet Take 0.25-0.5 mg by mouth daily as needed. 09/05/17  Yes [provider]  aspirin EC 81 MG tablet Take 1 tablet (81 mg total) by mouth daily. 10/08/15  Yes Dorothy Spark, MD  Cholecalciferol (VITAMIN D PO) Take by mouth daily.   Yes [provider]  fenofibrate 160 MG tablet Take 160 mg by mouth daily.  09/28/12  Yes [provider]  levothyroxine (SYNTHROID, LEVOTHROID) 88 MCG tablet Take 88 mcg by mouth daily. 08/08/17  Yes [provider]  metoprolol tartrate (LOPRESSOR) 50 MG tablet TAKE 1 TABLET BY MOUTH TWICE A DAY 04/16/17  Yes Dorothy Spark, MD  Multiple Vitamins-Minerals (MULTIVITAMIN PO) Take by mouth daily.   Yes [provider]  Omega-3 Fatty Acids (FISH OIL) 1000 MG CAPS Take by mouth daily.   Yes [provider]  pantoprazole (PROTONIX) 40 MG tablet Take 40 mg by mouth daily. 07/27/17  Yes [provider]  QUEtiapine  (SEROQUEL) 50 MG tablet Take 50-150 mg by mouth at bedtime as needed. 09/04/17  Yes [provider]  valsartan (DIOVAN) 320 MG tablet Take 320 mg by mouth daily.   Yes [provider]  venlafaxine XR (EFFEXOR-XR) 150 MG 24 hr capsule Take 150 mg by mouth daily. 08/20/15  Yes [provider]  zolpidem (AMBIEN) 10 MG tablet Take 10 mg by mouth at bedtime. 09/05/17  Yes [provider]   Physical Exam: Blood pressure (!) 187/106, pulse 64, temperature 97.8 F (36.6 C), temperature source Oral, resp. rate 17, height 5\' 7"  (1.702 m), weight 104.3 kg (230 lb), SpO2 97 %. 1. General:  in No Acute distress  well -appearing 2. Psychological: Alert and Oriented 3. Head/ENT:   Moist Mucous Membranes                          Head Non traumatic, neck supple                          Poor Dentition 4. SKIN: normal Skin turgor,  Skin clean Dry and intact diffuse lower extremity rash noted    Non-blanching, not palpable 5. Heart: Regular rate and rhythm no Murmur, no Rub or gallop 6. Lungs:  no wheezes some crackles  diminished 7. Abdomen: Soft,  non-tender, Non distended  Obese bowel sounds present 8. Lower extremities: no clubbing, cyanosis, 2+ edema 9. Neurologically Grossly intact, moving all 4 extremities equally  10. MSK: Normal range of motion 11.  Diminished  pulses lower extremity bilaterally nonpalpable but dopplerable  LABS:     Recent Labs  Lab 09/21/17 1838  WBC 8.8  NEUTROABS 6.7  HGB 13.9  HCT 42.4  MCV 92.6  PLT 622   Basic Metabolic Panel: Recent Labs  Lab 09/21/17 1838  NA 144  K 4.0  CL 106  CO2 27  GLUCOSE 113*  BUN 15  CREATININE 0.94  CALCIUM 9.8      Recent Labs  Lab 09/21/17 1838  AST 64*  ALT 32  ALKPHOS 61  BILITOT 0.7  PROT 7.8  ALBUMIN 3.7   Recent Labs  Lab 09/21/17 1838  LIPASE 38   No results for input(s): AMMONIA in the last 168 hours.    HbA1C: No results for input(s): HGBA1C in the last 72  hours. CBG: No results for input(s): GLUCAP in the last 168 hours.    Urine analysis:    Component Value Date/Time   COLORURINE YELLOW 09/21/2017 1838   APPEARANCEUR CLEAR 09/21/2017 1838   LABSPEC 1.020 09/21/2017 1838   PHURINE 6.0 09/21/2017 1838   GLUCOSEU NEGATIVE 09/21/2017 1838   HGBUR NEGATIVE 09/21/2017 1838   BILIRUBINUR NEGATIVE 09/21/2017 1838   KETONESUR NEGATIVE 09/21/2017 1838   PROTEINUR NEGATIVE 09/21/2017 1838   NITRITE NEGATIVE 09/21/2017 1838   LEUKOCYTESUR NEGATIVE 09/21/2017 1838     Cultures: No results found for: SDES, SPECREQUEST, CULT, REPTSTATUS   Radiological Exams on Admission: Dg Chest 2 View  Result Date: 09/21/2017 CLINICAL DATA:  Persistent cough and shortness of breath. EXAM: CHEST - 2 VIEW COMPARISON:  None. FINDINGS: The heart is at the upper limits of normal in size. Normal mediastinal contours. Mild increased peripheral interstitial markings at the lung bases. Trace bilateral pleural effusions. Right basilar atelectasis. No pneumothorax. No acute osseous abnormality. IMPRESSION: 1. Mild interstitial edema and trace bilateral pleural effusions. Electronically Signed   By: Titus Dubin M.D.   On: 09/21/2017 16:05   Ct Angio Chest Pe W/cm &/or Wo Cm  Result Date: 09/21/2017 CLINICAL DATA:  Shortness of breath for 3 days. EXAM: CT ANGIOGRAPHY CHEST WITH CONTRAST TECHNIQUE: Multidetector CT imaging of the chest was performed using the standard protocol during bolus administration of intravenous contrast. Multiplanar CT image reconstructions and MIPs were obtained to evaluate the vascular anatomy. CONTRAST:  17mL ISOVUE-370 IOPAMIDOL (ISOVUE-370) INJECTION 76% COMPARISON:  Two-view chest x-ray of the same day. FINDINGS: Cardiovascular: The heart size is normal. Coronary artery calcifications are present. Atherosclerotic changes are noted at the aortic arch. Great vessel origins are within normal limits. Pulmonary artery opacification is satisfactory.  There are no focal filling defects to suggest pulmonary embolus. Mediastinum/Nodes: No significant mediastinal, hilar, or axillary adenopathy. Thoracic inlet is within normal limits. Visualized thyroid is unremarkable. Lungs/Pleura: Right greater than left pleural effusions are present. There is mild dependent atelectasis. Ill-defined right middle lobe airspace opacities are noted. Interlobular septal thickening is present in the right middle and right lower lobes. Mild dependent atelectasis is present on the left. Upper Abdomen: Limited imaging of the upper abdomen is unremarkable. Musculoskeletal: Vertebral body heights alignment are normal. No focal lytic or blastic lesions are present. Sternum is within normal limits. Ribs are unremarkable. Review of the MIP images confirms the above findings. IMPRESSION: 1. No pulmonary embolus. 2. Right greater than left pleural effusions. 3. Scattered right middle lobe airspace disease concerning for infection. 4. Smaller left pleural effusion and mild atelectasis. 5. Mild interlobular septal thickening suggesting some degree of edema. Electronically Signed  By: San Morelle M.D.   On: 09/21/2017 20:47    Chart has been reviewed    Assessment/Plan  62 y.o. male with medical history significant of A.fib  HTN, hypertriglyceridemia, OSA on CPAP, hypothyroidism, anxiety, GERD, tobacco abuse, heavy ETOH use, GERD     Admitted for dyspnea secondary to pneumonia pleural effusions versus CHF exacerbation    Present on Admission: . CAP (community acquired pneumonia) CT cannot rule out possible infectious process we will check prealbumin to attempt to help differentiate infectious versus cardiac etiology  . Atrial fibrillation ([place])-         - CHA2DS2 vas score 1 : Currently appears to be in sinus rhythm If recurrent atrial fibrillation and increased based on new findings may need to be readdressed . GERD (gastroesophageal reflux disease) continue home  medications . Hyperlipidemia -stable check lipid panel continue home medications . Hypothyroidism stable chronic continue home medications check TSH . Obstructive sleep apnea -CPAP ordered . Chest pain morbid abdominal pain associated with coughing but given significant dyspnea will monitor on telemetry and cycle cardiac enzymes . Pleural effusion -unclear if related to underlying possible infection versus fluid overload if persistent despite diuresis would benefit from   diagnostic tap . ETOH abuse denies history of alcohol withdrawal will monitor on CIWA protocol . Claudication ([place]) order ABI given diminished pulses bilaterally would benefit from vascular consult and follow-up  Bilateral pleural effusions with evidence of fluid overload we will obtain echogram to evaluate for heart failure  Other plan as per orders.  DVT prophylaxis:  Lovenox     Code Status:  FULL CODE  as per patient  I had personally discussed CODE STATUS with patient and family  Family Communication:   Family   at  Bedside  plan of care was discussed with   Wife   Disposition Plan:    To home once workup is complete and patient is stable                                                Consults called: Cardiology aware   Admission status:   inpatient      Level of care     tele            Toy Baker 09/22/2017, 1:07 AM    [facility]  Pager [phone]   after 2 AM please page floor coverage PA If 7AM-7PM, please contact the day team taking care of the patient  [email or web address]  Password [email or web address]

## 2017-09-21 NOTE — ED Triage Notes (Signed)
Patient c/o SOB x 3 days ago. Patient reports that he thought he pulled a muscle, but has noted that he gets SOB when walking short distances. Patient also reports a productive cough with green sputum x 3 days.

## 2017-09-21 NOTE — ED Notes (Signed)
EKG completed and EDP notified.

## 2017-09-21 NOTE — ED Notes (Signed)
Bed: WA20 Expected date: 09/21/17 Expected time: 5:11 PM Means of arrival:  Comments: Waiting on bed

## 2017-09-21 NOTE — ED Notes (Addendum)
Pt refused to put on hospital grown stated he has been for 8 hours and nobody ask him to do so at that time. Pt been moved [place]

## 2017-09-22 ENCOUNTER — Encounter (HOSPITAL_COMMUNITY): Payer: BC Managed Care – PPO

## 2017-09-22 DIAGNOSIS — I4891 Unspecified atrial fibrillation: Secondary | ICD-10-CM

## 2017-09-22 DIAGNOSIS — I5031 Acute diastolic (congestive) heart failure: Secondary | ICD-10-CM

## 2017-09-22 DIAGNOSIS — I739 Peripheral vascular disease, unspecified: Secondary | ICD-10-CM | POA: Diagnosis present

## 2017-09-22 DIAGNOSIS — J90 Pleural effusion, not elsewhere classified: Secondary | ICD-10-CM

## 2017-09-22 LAB — LIPID PANEL
Cholesterol: 175 mg/dL (ref 0–200)
HDL: 64 mg/dL (ref >40)
LDL CALC: 95 mg/dL (ref 0–99)
TRIGLYCERIDES: 81 mg/dL (ref <150)
Total CHOL/HDL Ratio: 2.7 RATIO
VLDL: 16 mg/dL (ref 0–40)

## 2017-09-22 LAB — TROPONIN I
Troponin I: <0.03 ng/mL (ref <0.03)
Troponin I: <0.03 ng/mL (ref <0.03)
Troponin I: <0.03 ng/mL (ref <0.03)

## 2017-09-22 LAB — COMPREHENSIVE METABOLIC PANEL
ALBUMIN: 3.2 g/dL — AB (ref 3.5–5.0)
ALK PHOS: 49 U/L (ref 38–126)
ALT: 31 U/L (ref 17–63)
ANION GAP: 10 (ref 5–15)
AST: 71 U/L — ABNORMAL HIGH (ref 15–41)
BUN: 12 mg/dL (ref 6–20)
CHLORIDE: 99 mmol/L — AB (ref 101–111)
CO2: 29 mmol/L (ref 22–32)
Calcium: 9.3 mg/dL (ref 8.9–10.3)
Creatinine, Ser: 1 mg/dL (ref 0.61–1.24)
GFR calc Af Amer: >60 mL/min (ref >60)
GFR calc non Af Amer: >60 mL/min (ref >60)
Glucose, Bld: 104 mg/dL — ABNORMAL HIGH (ref 65–99)
POTASSIUM: 3.7 mmol/L (ref 3.5–5.1)
SODIUM: 138 mmol/L (ref 135–145)
Total Bilirubin: 0.8 mg/dL (ref 0.3–1.2)
Total Protein: 7 g/dL (ref 6.5–8.1)

## 2017-09-22 LAB — CBC
HEMATOCRIT: 38.7 % — AB (ref 39.0–52.0)
HEMOGLOBIN: 12.6 g/dL — AB (ref 13.0–17.0)
MCH: 29.6 pg (ref 26.0–34.0)
MCHC: 32.6 g/dL (ref 30.0–36.0)
MCV: 91.1 fL (ref 78.0–100.0)
Platelets: 173 10*3/uL (ref 150–400)
RBC: 4.25 MIL/uL (ref 4.22–5.81)
RDW: 13.4 % (ref 11.5–15.5)
WBC: 6.9 10*3/uL (ref 4.0–10.5)

## 2017-09-22 LAB — APTT: aPTT: 40 seconds — ABNORMAL HIGH (ref 24–36)

## 2017-09-22 LAB — PHOSPHORUS: Phosphorus: 4 mg/dL (ref 2.5–4.6)

## 2017-09-22 LAB — TSH: TSH: 3.719 u[IU]/mL (ref 0.350–4.500)

## 2017-09-22 LAB — MAGNESIUM: MAGNESIUM: 1.7 mg/dL (ref 1.7–2.4)

## 2017-09-22 LAB — LACTIC ACID, PLASMA
Lactic Acid, Venous: 1.4 mmol/L (ref 0.5–1.9)
Lactic Acid, Venous: 0.8 mmol/L (ref 0.5–1.9)

## 2017-09-22 LAB — PROCALCITONIN: Procalcitonin: <0.1 ng/mL

## 2017-09-22 LAB — HIV ANTIBODY (ROUTINE TESTING W REFLEX): HIV SCREEN 4TH GENERATION: NONREACTIVE

## 2017-09-22 LAB — STREP PNEUMONIAE URINARY ANTIGEN: Strep Pneumo Urinary Antigen: NEGATIVE

## 2017-09-22 MED ORDER — ASPIRIN EC 81 MG PO TBEC
81.0000 mg | DELAYED_RELEASE_TABLET | Freq: Every day | ORAL | Status: DC
  Administered 2017-09-22 – 2017-09-23 (×2): 81 mg via ORAL
  Filled 2017-09-22 (×2): qty 1

## 2017-09-22 MED ORDER — FUROSEMIDE 10 MG/ML IJ SOLN
40.0000 mg | Freq: Two times a day (BID) | INTRAMUSCULAR | Status: DC
  Administered 2017-09-22: 40 mg via INTRAVENOUS
  Filled 2017-09-22: qty 4

## 2017-09-22 MED ORDER — MAGNESIUM SULFATE 4 GM/100ML IV SOLN
4.0000 g | Freq: Once | INTRAVENOUS | Status: AC
  Administered 2017-09-22: 4 g via INTRAVENOUS
  Filled 2017-09-22: qty 100

## 2017-09-22 MED ORDER — PANTOPRAZOLE SODIUM 40 MG PO TBEC
40.0000 mg | DELAYED_RELEASE_TABLET | Freq: Every day | ORAL | Status: DC
  Administered 2017-09-22 – 2017-09-23 (×2): 40 mg via ORAL
  Filled 2017-09-22 (×2): qty 1

## 2017-09-22 MED ORDER — ADULT MULTIVITAMIN W/MINERALS CH
1.0000 | ORAL_TABLET | Freq: Every day | ORAL | Status: DC
  Administered 2017-09-22 – 2017-09-23 (×2): 1 via ORAL
  Filled 2017-09-22 (×2): qty 1

## 2017-09-22 MED ORDER — VENLAFAXINE HCL ER 75 MG PO CP24
300.0000 mg | ORAL_CAPSULE | Freq: Every day | ORAL | Status: DC
  Administered 2017-09-22 – 2017-09-23 (×2): 300 mg via ORAL
  Filled 2017-09-22 (×2): qty 4

## 2017-09-22 MED ORDER — METOPROLOL TARTRATE 50 MG PO TABS
50.0000 mg | ORAL_TABLET | Freq: Two times a day (BID) | ORAL | Status: DC
  Administered 2017-09-22 – 2017-09-23 (×4): 50 mg via ORAL
  Filled 2017-09-22 (×4): qty 1

## 2017-09-22 MED ORDER — THIAMINE HCL 100 MG/ML IJ SOLN: 100.0000 mg | Freq: Every day | INTRAMUSCULAR | Status: DC

## 2017-09-22 MED ORDER — FOLIC ACID 1 MG PO TABS
1.0000 mg | ORAL_TABLET | Freq: Every day | ORAL | Status: DC
  Administered 2017-09-22 – 2017-09-23 (×2): 1 mg via ORAL
  Filled 2017-09-22 (×2): qty 1

## 2017-09-22 MED ORDER — FENOFIBRATE 160 MG PO TABS
160.0000 mg | ORAL_TABLET | Freq: Every day | ORAL | Status: DC
  Administered 2017-09-22 – 2017-09-23 (×2): 160 mg via ORAL
  Filled 2017-09-22 (×2): qty 1

## 2017-09-22 MED ORDER — SODIUM CHLORIDE 0.9% FLUSH
3.0000 mL | Freq: Two times a day (BID) | INTRAVENOUS | Status: DC
  Administered 2017-09-22 – 2017-09-23 (×4): 3 mL via INTRAVENOUS

## 2017-09-22 MED ORDER — ACETAMINOPHEN 325 MG PO TABS
650.0000 mg | ORAL_TABLET | Freq: Four times a day (QID) | ORAL | Status: DC | PRN
  Administered 2017-09-22 – 2017-09-23 (×2): 650 mg via ORAL
  Filled 2017-09-22 (×2): qty 2

## 2017-09-22 MED ORDER — ONDANSETRON HCL 4 MG/2ML IJ SOLN: 4.0000 mg | Freq: Four times a day (QID) | INTRAMUSCULAR | Status: DC | PRN

## 2017-09-22 MED ORDER — OXYMETAZOLINE HCL 0.05 % NA SOLN
1.0000 | Freq: Two times a day (BID) | NASAL | Status: DC
  Administered 2017-09-22 – 2017-09-23 (×2): 1 via NASAL
  Filled 2017-09-22: qty 15

## 2017-09-22 MED ORDER — ZOLPIDEM TARTRATE 5 MG PO TABS: 10.0000 mg | ORAL_TABLET | Freq: Every day | ORAL | Status: DC

## 2017-09-22 MED ORDER — VITAMIN B-1 100 MG PO TABS
100.0000 mg | ORAL_TABLET | Freq: Every day | ORAL | Status: DC
  Administered 2017-09-22 – 2017-09-23 (×2): 100 mg via ORAL
  Filled 2017-09-22 (×2): qty 1

## 2017-09-22 MED ORDER — PNEUMOCOCCAL VAC POLYVALENT 25 MCG/0.5ML IJ INJ
0.5000 mL | INJECTION | INTRAMUSCULAR | Status: AC
  Administered 2017-09-23: 0.5 mL via INTRAMUSCULAR
  Filled 2017-09-22: qty 0.5

## 2017-09-22 MED ORDER — LORAZEPAM 2 MG/ML IJ SOLN: 1.0000 mg | Freq: Four times a day (QID) | INTRAMUSCULAR | Status: DC | PRN

## 2017-09-22 MED ORDER — SODIUM CHLORIDE 0.9 % IV SOLN
1.0000 g | INTRAVENOUS | Status: DC
  Administered 2017-09-22 – 2017-09-23 (×2): 1 g via INTRAVENOUS
  Filled 2017-09-22 (×2): qty 10

## 2017-09-22 MED ORDER — IRBESARTAN 300 MG PO TABS
300.0000 mg | ORAL_TABLET | Freq: Every day | ORAL | Status: DC
  Administered 2017-09-22 – 2017-09-23 (×2): 300 mg via ORAL
  Filled 2017-09-22 (×2): qty 1

## 2017-09-22 MED ORDER — LEVOTHYROXINE SODIUM 88 MCG PO TABS
88.0000 ug | ORAL_TABLET | Freq: Every day | ORAL | Status: DC
  Administered 2017-09-22 – 2017-09-23 (×2): 88 ug via ORAL
  Filled 2017-09-22 (×2): qty 1

## 2017-09-22 MED ORDER — ONDANSETRON HCL 4 MG PO TABS: 4.0000 mg | ORAL_TABLET | Freq: Four times a day (QID) | ORAL | Status: DC | PRN

## 2017-09-22 MED ORDER — ENOXAPARIN SODIUM 40 MG/0.4ML ~~LOC~~ SOLN
40.0000 mg | SUBCUTANEOUS | Status: DC
  Administered 2017-09-22 – 2017-09-23 (×2): 40 mg via SUBCUTANEOUS
  Filled 2017-09-22 (×2): qty 0.4

## 2017-09-22 MED ORDER — LORAZEPAM 1 MG PO TABS
1.0000 mg | ORAL_TABLET | Freq: Four times a day (QID) | ORAL | Status: DC | PRN
  Administered 2017-09-22: 1 mg via ORAL
  Filled 2017-09-22: qty 1

## 2017-09-22 MED ORDER — SODIUM CHLORIDE 0.9% FLUSH: 3.0000 mL | INTRAVENOUS | Status: DC | PRN

## 2017-09-22 MED ORDER — ACETAMINOPHEN 650 MG RE SUPP: 650.0000 mg | Freq: Four times a day (QID) | RECTAL | Status: DC | PRN

## 2017-09-22 MED ORDER — FUROSEMIDE 40 MG PO TABS
40.0000 mg | ORAL_TABLET | Freq: Every day | ORAL | Status: DC
  Administered 2017-09-22 – 2017-09-23 (×2): 40 mg via ORAL
  Filled 2017-09-22 (×2): qty 1

## 2017-09-22 MED ORDER — ZOLPIDEM TARTRATE 5 MG PO TABS
10.0000 mg | ORAL_TABLET | Freq: Every evening | ORAL | Status: DC | PRN
  Administered 2017-09-22: 10 mg via ORAL
  Filled 2017-09-22: qty 2

## 2017-09-22 MED ORDER — SODIUM CHLORIDE 0.9 % IV SOLN: 250.0000 mL | INTRAVENOUS | Status: DC | PRN

## 2017-09-22 MED ORDER — SODIUM CHLORIDE 0.9 % IV SOLN
500.0000 mg | INTRAVENOUS | Status: DC
  Administered 2017-09-22 – 2017-09-23 (×2): 500 mg via INTRAVENOUS
  Filled 2017-09-22 (×2): qty 500

## 2017-09-22 MED ORDER — HYDROCODONE-ACETAMINOPHEN 5-325 MG PO TABS: 1.0000 | ORAL_TABLET | ORAL | Status: DC | PRN

## 2017-09-22 NOTE — ED Notes (Signed)
Report called 3E RN

## 2017-09-22 NOTE — Progress Notes (Signed)
PROGRESS NOTE    Carl [place]rey  GGY:[id number] DOB: 1955-06-16 DOA: 09/21/2017 PCP: Carl Sheller, MD Brief Carl [place]rey y.o. male with medical history significant of A.fib  HTN, hypertriglyceridemia, OSA on CPAP, hypothyroidism, anxiety, GERD, tobacco abuse, heavy ETOH use, GERD     Presented with   3 days of exertional dyspnea. Reports he was coughing up phlegm and it would not come up he coughed a lot. His mother was in the hospital Sunday he is unsure with what.  Had significant cough and been sore thought maybe he pulled a muscle. Reports chest pain/abdominal pain worse with exertion.  Been having non-productive sputum for the past 3 days. Does have chronic lower extremity swelling. He reports his legs were getting tired after just  A bit of walking.  no fevers or chills.  Last travel history was to [place] last week.  He recently has gained 10 pounds but then was able to lose 5. This week if he lays down flat he feels short of breath and wheezing.  Denies history of coronary artery disease. Reports drinks 6 beers a day have been for years but reports no hx of et oh withdrawal last beer was yesterday.  Wife reports more fatigued.    Regarding pertinent Chronic problems: A.fib found in 2017 CHADS2-VASc=1 (HTN). He was placed on Metoprolol Tartrate for rate control.   last echo 2017 normal EF     Assessment & Plan:   Active Problems:   Hyperlipidemia   Obstructive sleep apnea   Hypothyroidism   GERD (gastroesophageal reflux disease)   ETOH abuse   Atrial fibrillation (HCC)   CAP (community acquired pneumonia)   Chest pain   Pleural effusion   Claudication ([place])  1]CAP-patient admitted with cough and shortness of breath and pleuritic chest pain.  On Rocephin and azithromycin.  Patient reports that he is feeling better his cough is better however has pleuritic chest pain and muscular upper abdominal pain from coughing.  CT of the  chest showed no evidence of PE right  greater than left pleural effusion scattered right middle lobe airspace disease concerning for infection, small left pleural effusion and mild atelectasis mild interlobular septal thickening suggestive of some degree of edema.  2] chronic atrial fibrillation-rate controlled on metoprolol tartrate 50 mg 2 times a day.  Echocardiogram ordered.  Cardiology consult was placed in the ER.  Continue Lasix. 3] chronic alcohol abuse-continue CIWA scale  4] obstructive sleep apnea CPAP while in hospital.  5] GERD  7] hypothyroidism continue Synthroid.  6] claudication ABI ordered.  7] hypomagnesemia magnesium 1.7 replete.    DVT prophylaxis: Lovenox Code Status: Full code Family Communication: No family available Disposition Plan: TBD Consultants: Cardiology pending  Procedures: None Antimicrobials: None Subjective: Feels better  Objective: Vitals:   09/22/17 0132 09/22/17 0217 09/22/17 0510 09/22/17 0858  BP: (!) 157/95  (!) 156/91 140/80  Pulse: 69 68 (!) 58 60  Resp: 18 16 18    Temp: (!) 97.5 F (36.4 C)  97.9 F (36.6 C)   TempSrc: Oral  Oral   SpO2: 95% 95% 94%   Weight: 103.8 kg (228 lb 12.8 oz)     Height: 5\' 8"  (1.727 m)       Intake/Output Summary (Last 24 hours) at 09/22/2017 0906 Last data filed at 09/22/2017 0727 Gross per 24 hour  Intake 350 ml  Output 1900 ml  Net -1550 ml   Filed Weights   09/21/17 1533 09/22/17 0132  Weight: 104.3 kg (230 lb)  103.8 kg (228 lb 12.8 oz)    Examination:  General exam: Appears calm and comfortable  Respiratory system: Clear to auscultation. Respiratory effort normal. Cardiovascular system: S1 & S2 heard, RRR. No JVD, murmurs, rubs, gallops or clicks. No pedal edema. Gastrointestinal system: Abdomen is nondistended, soft and nontender. No organomegaly or masses felt. Normal bowel sounds heard. Central nervous system: Alert and oriented. No focal neurological deficits. Extremities: Symmetric 5 x 5 power. Skin: No rashes,  lesions or ulcers Psychiatry: Judgement and insight appear normal. Mood & affect appropriate.     Data Reviewed: I have personally reviewed following labs and imaging studies  CBC: Recent Labs  Lab 09/21/17 1838 09/22/17 0521  WBC 8.8 6.9  NEUTROABS 6.7  --   HGB 13.9 12.6*  HCT 42.4 38.7*  MCV 92.6 91.1  PLT 199 427   Basic Metabolic Panel: Recent Labs  Lab 09/21/17 1838 09/22/17 0521  NA 144 138  K 4.0 3.7  CL 106 99*  CO2 27 29  GLUCOSE 113* 104*  BUN 15 12  CREATININE 0.94 1.00  CALCIUM 9.8 9.3  MG  --  1.7  PHOS  --  4.0   GFR: Estimated Creatinine Clearance: 91.8 mL/min (by C-G formula based on SCr of 1 mg/dL). Liver Function Tests: Recent Labs  Lab 09/21/17 1838 09/22/17 0521  AST 64* 71*  ALT 32 31  ALKPHOS 61 49  BILITOT 0.7 0.8  PROT 7.8 7.0  ALBUMIN 3.7 3.2*   Recent Labs  Lab 09/21/17 1838  LIPASE 38   No results for input(s): AMMONIA in the last 168 hours. Coagulation Profile: Recent Labs  Lab 09/21/17 1838  INR 1.25   Cardiac Enzymes: Recent Labs  Lab 09/21/17 2315 09/22/17 0521  TROPONINI <0.03 <0.03   BNP (last 3 results) No results for input(s): PROBNP in the last 8760 hours. HbA1C: No results for input(s): HGBA1C in the last 72 hours. CBG: No results for input(s): GLUCAP in the last 168 hours. Lipid Profile: Recent Labs    09/22/17 0521  CHOL 175  HDL 64  LDLCALC 95  TRIG 81  CHOLHDL 2.7   Thyroid Function Tests: Recent Labs    09/22/17 0521  TSH 3.719   Anemia Panel: No results for input(s): VITAMINB12, FOLATE, FERRITIN, TIBC, IRON, RETICCTPCT in the last 72 hours. Sepsis Labs: Recent Labs  Lab 09/22/17 0229 09/22/17 0521  PROCALCITON <0.10  --   LATICACIDVEN 1.4 0.8    No results found for this or any previous visit (from the past 240 hour(s)).       Radiology Studies: Dg Chest 2 View  Result Date: 09/21/2017 CLINICAL DATA:  Persistent cough and shortness of breath. EXAM: CHEST - 2 VIEW  COMPARISON:  None. FINDINGS: The heart is at the upper limits of normal in size. Normal mediastinal contours. Mild increased peripheral interstitial markings at the lung bases. Trace bilateral pleural effusions. Right basilar atelectasis. No pneumothorax. No acute osseous abnormality. IMPRESSION: 1. Mild interstitial edema and trace bilateral pleural effusions. Electronically Signed   By: Titus Dubin M.D.   On: 09/21/2017 16:05   Ct Angio Chest Pe W/cm &/or Wo Cm  Result Date: 09/21/2017 CLINICAL DATA:  Shortness of breath for 3 days. EXAM: CT ANGIOGRAPHY CHEST WITH CONTRAST TECHNIQUE: Multidetector CT imaging of the chest was performed using the standard protocol during bolus administration of intravenous contrast. Multiplanar CT image reconstructions and MIPs were obtained to evaluate the vascular anatomy. CONTRAST:  129mL ISOVUE-370 IOPAMIDOL (ISOVUE-370) INJECTION 76%  COMPARISON:  Two-view chest x-ray of the same day. FINDINGS: Cardiovascular: The heart size is normal. Coronary artery calcifications are present. Atherosclerotic changes are noted at the aortic arch. Great vessel origins are within normal limits. Pulmonary artery opacification is satisfactory. There are no focal filling defects to suggest pulmonary embolus. Mediastinum/Nodes: No significant mediastinal, hilar, or axillary adenopathy. Thoracic inlet is within normal limits. Visualized thyroid is unremarkable. Lungs/Pleura: Right greater than left pleural effusions are present. There is mild dependent atelectasis. Ill-defined right middle lobe airspace opacities are noted. Interlobular septal thickening is present in the right middle and right lower lobes. Mild dependent atelectasis is present on the left. Upper Abdomen: Limited imaging of the upper abdomen is unremarkable. Musculoskeletal: Vertebral body heights alignment are normal. No focal lytic or blastic lesions are present. Sternum is within normal limits. Ribs are unremarkable. Review  of the MIP images confirms the above findings. IMPRESSION: 1. No pulmonary embolus. 2. Right greater than left pleural effusions. 3. Scattered right middle lobe airspace disease concerning for infection. 4. Smaller left pleural effusion and mild atelectasis. 5. Mild interlobular septal thickening suggesting some degree of edema. Electronically Signed   By: San Morelle M.D.   On: 09/21/2017 20:47        Scheduled Meds: . aspirin EC  81 mg Oral Daily  . enoxaparin (LOVENOX) injection  40 mg Subcutaneous Q24H  . fenofibrate  160 mg Oral Daily  . folic acid  1 mg Oral Daily  . furosemide  40 mg Intravenous Q12H  . irbesartan  300 mg Oral Daily  . levothyroxine  88 mcg Oral QAC breakfast  . metoprolol tartrate  50 mg Oral BID  . multivitamin with minerals  1 tablet Oral Daily  . pantoprazole  40 mg Oral Daily  . [START ON 09/23/2017] pneumococcal 23 valent vaccine  0.5 mL Intramuscular Tomorrow-1000  . sodium chloride flush  3 mL Intravenous Q12H  . thiamine  100 mg Oral Daily   Or  . thiamine  100 mg Intravenous Daily  . venlafaxine XR  300 mg Oral Daily   Continuous Infusions: . sodium chloride    . azithromycin Stopped (09/22/17 0413)  . cefTRIAXone (ROCEPHIN)  IV Stopped (09/22/17 0241)     LOS: 1 day     Georgette Shell, MD [facility] If 7PM-7AM, please contact night-coverage [email or web address] Password [email or web address] 09/22/2017, 9:06 AM

## 2017-09-22 NOTE — Consult Note (Addendum)
Cardiology Consultation:   Patient ID: Carl Humphrey; [id number]; 10-Jul-1955   Admit date: 09/21/2017 Date of Consult: 09/22/2017  Primary Care Provider: Thressa Sheller, MD Primary Cardiologist: Ena Dawley, MD  Primary Electrophysiologist:     Patient Profile:   Carl Humphrey is a 62 y.o. male with a hx of HTN, HLD, OSA on CPAP, hypothyroidism, anxiety, GERD, tobacco abuse, heavy ETOH, and paroxysmal atrial fibrillation use who is being seen today for the evaluation of chest pain at the request of Dr. Rodena Piety.  History of Present Illness:   Mr. Sweetin was last seen in clinic by Dr. Meda Coffee on 03/22/17. At that time, he was in NSR but complained on dyspnea on exertion. She ordered a CT coronary, but this was not completed. The patient states he never rescheduled.  His CHADS2-VASc=1 and is therefore not on long term anticoagulation. He takes 81 mg ASA daily.   He presented to [facility] on 09/21[facility]17 with complaints of coughing and found to have PNA. ABX started by primary team. He complains of exertional dyspnea over the past 3-4 days and chest wall pain associated with excessive coughing.    On my interview, his coughing and chest wall pain started on Tues 09/18/17. At that time, he also started experiencing some dyspnea on exertion and was unable to walk short distances due to shortness of breath. He denies anginal-like pain. His chest wall pain is associated with coughing and is worse with deep breathing. He states that over the past year, he has had muscular leg fatigue. He sometimes feels as though he ran a marathon. This leg pain has persisted for the past year. He denies leg swelling but was told by a provider at [facility] that he had lower extremity swelling. Of note, he has also developed a petechial rash on his lower extremities.   Past Medical History:  Diagnosis Date  . Anxiety   . Atrial fibrillation ([place])    a. Dx 08/13/2015;  b. CHA2DS2VASc = 1.  . ETOH abuse   . GERD (gastroesophageal  reflux disease)   . Hypertensive heart disease   . Hypertriglyceridemia   . Hypothyroidism   . Obstructive sleep apnea    a. On CPAP x ~ 10 yrs.    Past Surgical History:  Procedure Laterality Date  . no prior surgery       Home Medications:  Prior to Admission medications   Medication Sig Start Date End Date Taking? Authorizing Provider  ALPRAZolam Duanne Moron) 0.5 MG tablet Take 0.25-0.5 mg by mouth daily as needed. 09/05/17  Yes [provider]  aspirin EC 81 MG tablet Take 1 tablet (81 mg total) by mouth daily. 10/08/15  Yes Dorothy Spark, MD  Cholecalciferol (VITAMIN D PO) Take by mouth daily.   Yes [provider]  fenofibrate 160 MG tablet Take 160 mg by mouth daily.  09/28/12  Yes [provider]  levothyroxine (SYNTHROID, LEVOTHROID) 88 MCG tablet Take 88 mcg by mouth daily. 08/08/17  Yes [provider]  metoprolol tartrate (LOPRESSOR) 50 MG tablet TAKE 1 TABLET BY MOUTH TWICE A DAY 04/16/17  Yes Dorothy Spark, MD  Multiple Vitamins-Minerals (MULTIVITAMIN PO) Take by mouth daily.   Yes [provider]  Omega-3 Fatty Acids (FISH OIL) 1000 MG CAPS Take by mouth daily.   Yes [provider]  pantoprazole (PROTONIX) 40 MG tablet Take 40 mg by mouth daily. 07/27/17  Yes [provider]  valsartan (DIOVAN) 320 MG tablet Take 320 mg by mouth daily.  Yes [provider]  venlafaxine XR (EFFEXOR-XR) 150 MG 24 hr capsule Take 300 mg by mouth daily.  08/20/15  Yes [provider]  zolpidem (AMBIEN) 10 MG tablet Take 10 mg by mouth at bedtime. 09/05/17  Yes [provider]    Inpatient Medications: Scheduled Meds: . aspirin EC  81 mg Oral Daily  . enoxaparin (LOVENOX) injection  40 mg Subcutaneous Q24H  . fenofibrate  160 mg Oral Daily  . folic acid  1 mg Oral Daily  . furosemide  40 mg Intravenous Q12H  . irbesartan  300 mg Oral Daily  . levothyroxine  88 mcg Oral QAC breakfast  . metoprolol  tartrate  50 mg Oral BID  . multivitamin with minerals  1 tablet Oral Daily  . pantoprazole  40 mg Oral Daily  . [START ON 09/23/2017] pneumococcal 23 valent vaccine  0.5 mL Intramuscular Tomorrow-1000  . sodium chloride flush  3 mL Intravenous Q12H  . thiamine  100 mg Oral Daily   Or  . thiamine  100 mg Intravenous Daily  . venlafaxine XR  300 mg Oral Daily   Continuous Infusions: . sodium chloride    . azithromycin Stopped (09/22/17 0413)  . cefTRIAXone (ROCEPHIN)  IV Stopped (09/22/17 0241)  . magnesium sulfate 1 - 4 g bolus IVPB 4 g (09/22/17 0943)   PRN Meds: sodium chloride, acetaminophen **OR** acetaminophen, HYDROcodone-acetaminophen, LORazepam **OR** LORazepam, ondansetron **OR** ondansetron (ZOFRAN) IV, sodium chloride flush  Allergies:   No Known Allergies  Social History:   Social History   Socioeconomic History  . Marital status: Unknown    Spouse name: Not on file  . Number of children: Not on file  . Years of education: Not on file  . Highest education level: Not on file  Occupational History  . Not on file  Social Needs  . Financial resource strain: Not on file  . Food insecurity:    Worry: Not on file    Inability: Not on file  . Transportation needs:    Medical: Not on file    Non-medical: Not on file  Tobacco Use  . Smoking status: Former Smoker    Last attempt to quit: 09/18/1997    Years since quitting: 20.0  . Smokeless tobacco: Former Systems developer    Quit date: 09/18/1997  Substance and Sexual Activity  . Alcohol use: Yes    Comment: He drinks 8-10 beers/day.  . Drug use: Yes    Types: Marijuana    Comment: uses marijuana 1 x month  . Sexual activity: Not on file  Lifestyle  . Physical activity:    Days per week: Not on file    Minutes per session: Not on file  . Stress: Not on file  Relationships  . Social connections:    Talks on phone: Not on file    Gets together: Not on file    Attends religious service: Not on file    Active member of  club or organization: Not on file    Attends meetings of clubs or organizations: Not on file    Relationship status: Not on file  . Intimate partner violence:    Fear of current or ex partner: Not on file    Emotionally abused: Not on file    Physically abused: Not on file    Forced sexual activity: Not on file  Other Topics Concern  . Not on file  Social History Narrative   Lives in [place] with his wife.  He walks his dog about 2.5 miles/day w/o limitations.  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    Family History:    Family History  Problem Relation Age of Onset  . Hypertension Mother        alive @ 29  . Hypertension Father        died of heart failure @ 73  . Multiple sclerosis Sister        deceased  . Other Sister        A & W  . Other Sister        A & W  . Colon cancer Neg Hx   . Heart attack Neg Hx   . Stroke Neg Hx      ROS:  Please see the history of present illness.   All other ROS reviewed and negative.     Physical Exam/Data:   Vitals:   09/22/17 0132 09/22/17 0217 09/22/17 0510 09/22/17 0858  BP: (!) 157/95  (!) 156/91 140/80  Pulse: 69 68 (!) 58 60  Resp: 18 16 18    Temp: (!) 97.5 F (36.4 C)  97.9 F (36.6 C)   TempSrc: Oral  Oral   SpO2: 95% 95% 94%   Weight: 228 lb 12.8 oz (103.8 kg)     Height: 5\' 8"  (1.727 m)       Intake/Output Summary (Last 24 hours) at 09/22/2017 1057 Last data filed at 09/22/2017 0727 Gross per 24 hour  Intake 350 ml  Output 1900 ml  Net -1550 ml   Filed Weights   09/21/17 1533 09/22/17 0132  Weight: 230 lb (104.3 kg) 228 lb 12.8 oz (103.8 kg)   Body mass index is 34.79 kg/m.  General:  Well nourished, well developed, in no acute distress HEENT: normal Neck: no JVD Vascular: No carotid bruits Cardiac:  normal S1, S2; RRR; no murmur  Lungs:  clear to auscultation bilaterally, no wheezing, rhonchi or rales  Abd: soft, nontender, no hepatomegaly  Ext: no edema Musculoskeletal:  No  deformities, BUE and BLE strength normal and equal Skin: warm and dry  Neuro:  CNs 2-12 intact, no focal abnormalities noted Psych:  Normal affect   EKG:  The EKG was personally reviewed and demonstrates:  sinus rhythm and incomplete RBBB (not new) Telemetry:  Telemetry was personally reviewed and demonstrates:  Sinus rhythm  Relevant CV Studies:  Echo 09/22/17: pending  Echo 08/30/15: Study Conclusions - Left ventricle: The cavity size was normal. Wall thickness was   normal. Systolic function was normal. The estimated ejection   fraction was in the range of 60% to 65%. Wall motion was normal;   there were no regional wall motion abnormalities. - Left atrium: The atrium was mildly dilated.   Laboratory Data:  Chemistry Recent Labs  Lab 09/21/17 1838 09/22/17 0521  NA 144 138  K 4.0 3.7  CL 106 99*  CO2 27 29  GLUCOSE 113* 104*  BUN 15 12  CREATININE 0.94 1.00  CALCIUM 9.8 9.3  GFRNONAA >60 >60  GFRAA >60 >60  ANIONGAP 11 10    Recent Labs  Lab 09/21/17 1838 09/22/17 0521  PROT 7.8 7.0  ALBUMIN 3.7 3.2*  AST 64* 71*  ALT 32 31  ALKPHOS 61 49  BILITOT 0.7 0.8   Hematology Recent Labs  Lab 09/21/17 1838 09/22/17 0521  WBC 8.8 6.9  RBC 4.58 4.25  HGB 13.9 12.6*  HCT 42.4 38.7*  MCV 92.6 91.1  MCH 30.3  29.6  MCHC 32.8 32.6  RDW 13.6 13.4  PLT 199 173   Cardiac Enzymes Recent Labs  Lab 09/21/17 2315 09/22/17 0521  TROPONINI <0.03 <0.03    Recent Labs  Lab 09/21/17 1843  TROPIPOC 0.00    BNP Recent Labs  Lab 09/21/17 1838  BNP 359.7*    DDimer No results for input(s): DDIMER in the last 168 hours.  Radiology/Studies:  Dg Chest 2 View  Result Date: 09/21/2017 CLINICAL DATA:  Persistent cough and shortness of breath. EXAM: CHEST - 2 VIEW COMPARISON:  None. FINDINGS: The heart is at the upper limits of normal in size. Normal mediastinal contours. Mild increased peripheral interstitial markings at the lung bases. Trace bilateral pleural  effusions. Right basilar atelectasis. No pneumothorax. No acute osseous abnormality. IMPRESSION: 1. Mild interstitial edema and trace bilateral pleural effusions. Electronically Signed   By: Titus Dubin M.D.   On: 09/21/2017 16:05   Ct Angio Chest Pe W/cm &/or Wo Cm  Result Date: 09/21/2017 CLINICAL DATA:  Shortness of breath for 3 days. EXAM: CT ANGIOGRAPHY CHEST WITH CONTRAST TECHNIQUE: Multidetector CT imaging of the chest was performed using the standard protocol during bolus administration of intravenous contrast. Multiplanar CT image reconstructions and MIPs were obtained to evaluate the vascular anatomy. CONTRAST:  145mL ISOVUE-370 IOPAMIDOL (ISOVUE-370) INJECTION 76% COMPARISON:  Two-view chest x-ray of the same day. FINDINGS: Cardiovascular: The heart size is normal. Coronary artery calcifications are present. Atherosclerotic changes are noted at the aortic arch. Great vessel origins are within normal limits. Pulmonary artery opacification is satisfactory. There are no focal filling defects to suggest pulmonary embolus. Mediastinum/Nodes: No significant mediastinal, hilar, or axillary adenopathy. Thoracic inlet is within normal limits. Visualized thyroid is unremarkable. Lungs/Pleura: Right greater than left pleural effusions are present. There is mild dependent atelectasis. Ill-defined right middle lobe airspace opacities are noted. Interlobular septal thickening is present in the right middle and right lower lobes. Mild dependent atelectasis is present on the left. Upper Abdomen: Limited imaging of the upper abdomen is unremarkable. Musculoskeletal: Vertebral body heights alignment are normal. No focal lytic or blastic lesions are present. Sternum is within normal limits. Ribs are unremarkable. Review of the MIP images confirms the above findings. IMPRESSION: 1. No pulmonary embolus. 2. Right greater than left pleural effusions. 3. Scattered right middle lobe airspace disease concerning for  infection. 4. Smaller left pleural effusion and mild atelectasis. 5. Mild interlobular septal thickening suggesting some degree of edema. Electronically Signed   By: San Morelle M.D.   On: 09/21/2017 20:47    Assessment and Plan:   1. Chest wall pain, dyspnea on exertion - echo pending - unclear why CT coronary was not completed in 2018 - troponin x 3 negative - EKG with sinus rhythm and incomplete RBBB (not new) - BNP was mildly elevated at 359.7 - his dyspnea on exertion correlates with the onset of productive cough. Chest wall pain is pleuritic. Echocardiogram is ordered and will guide decisions regarding ischemic evaluation. Given his acute onset shortness of breath, may have a component of CHF.    2. Right > left pleural effusions, PNA - ABX per primary team   3. Paroxysmal Afib - NSR on telemetry and EKG - he has done well on lopressor - no anticoagulation given his This patients CHA2DS2-VASc Score and unadjusted Ischemic Stroke Rate (% per year) is equal to 0.6 % stroke rate/year from a score of 1 (HTN) - infectious processes such as PNA may make conversion to Afib  more likely - we will continue to follow   4. Leg fatigue, petechial rash - this has been an ongoing problem  - per primary team   For questions or updates, please contact [place] Please consult [email or web address] for contact info under Cardiology/STEMI.   Signed, Ledora Bottcher, PA  09/22/2017 10:57 AM   History and all data above reviewed.  Patient examined.  I agree with the findings as above.  The patient presented with SOB.  This was noted last year when he saw Dr. Meda Coffee but he was mostly sent to her for an episode of atrial fib.    However, he now presents with cough and SOB walking 15 yards to the mailbox and 15 back.  He has to stop when he gets done and rest on the couch.   He says that this has been going on for a few days. He has had cough with pain in his upper abdominal area.  He  denies PND or orthopnea.  He is not describing chest pain, neck or arm pain.  He has had no palpitations, presyncope or syncope.  He does not think that he has has swelling although this is noted on exam.  One of his biggest complaints has been leg pain.    The patient exam reveals COR:RRR  ,  Lungs: Clear  ,  Abd: Positive bowel sounds, no rebound no guarding, Ext No edema  .  All available labs, radiology testing, previous records reviewed. Agree with documented assessment and plan. Dyspnea:  He did have a very mildly elevated BNP and some suggestion of edema on CXR.  CT demonstrated small pleural effusion and possible pneumonia.  He has not had fevers chills or leukocytosis.  I suspect that at least some component of this is HF and probably diastolic (echo is pending, previous echo with NL EF).  He has had Lasix IV x 2 and I will change to PO.  He is going to walk to see if his breathing is better.  We talked about salt and fluid restriction.  Ischemia work up is appropriate but this can be a CTA after discharge.    Jeneen Rinks Nishtha Raider  12:50 PM  09/22/2017

## 2017-09-22 NOTE — Progress Notes (Signed)
Pt is anxious about his time of Echo, when in touch with Echo techs, his name on the list but after couple of cases as he is not being discharged and nothing urgent this time. RN made him aware. Pt is ambulatory in a hallway, vitals stable, good urine output, will continue to monitor  Palma Holter, RN

## 2017-09-23 ENCOUNTER — Inpatient Hospital Stay (HOSPITAL_COMMUNITY): Payer: BC Managed Care – PPO

## 2017-09-23 DIAGNOSIS — I340 Nonrheumatic mitral (valve) insufficiency: Secondary | ICD-10-CM

## 2017-09-23 DIAGNOSIS — R531 Weakness: Secondary | ICD-10-CM

## 2017-09-23 DIAGNOSIS — R0600 Dyspnea, unspecified: Secondary | ICD-10-CM

## 2017-09-23 LAB — BASIC METABOLIC PANEL
ANION GAP: 11 (ref 5–15)
BUN: 20 mg/dL (ref 6–20)
CO2: 28 mmol/L (ref 22–32)
Calcium: 9.1 mg/dL (ref 8.9–10.3)
Chloride: 101 mmol/L (ref 101–111)
Creatinine, Ser: 1.04 mg/dL (ref 0.61–1.24)
GFR calc Af Amer: >60 mL/min (ref >60)
GFR calc non Af Amer: >60 mL/min (ref >60)
GLUCOSE: 90 mg/dL (ref 65–99)
POTASSIUM: 3.7 mmol/L (ref 3.5–5.1)
Sodium: 140 mmol/L (ref 135–145)

## 2017-09-23 LAB — ECHOCARDIOGRAM COMPLETE
Height: 68 in
Weight: 3566.16 oz

## 2017-09-23 LAB — MAGNESIUM: Magnesium: 2.1 mg/dL (ref 1.7–2.4)

## 2017-09-23 MED ORDER — AMOXICILLIN-POT CLAVULANATE 875-125 MG PO TABS: 1.0000 | ORAL_TABLET | Freq: Two times a day (BID) | ORAL | 0 refills | Status: AC

## 2017-09-23 MED ORDER — FUROSEMIDE 20 MG PO TABS: 20.0000 mg | ORAL_TABLET | Freq: Every day | ORAL | 11 refills | Status: DC

## 2017-09-23 MED ORDER — THIAMINE HCL 100 MG PO TABS: 100.0000 mg | ORAL_TABLET | Freq: Every day | ORAL | Status: DC

## 2017-09-23 MED ORDER — OXYMETAZOLINE HCL 0.05 % NA SOLN: 1.0000 | Freq: Two times a day (BID) | NASAL | 0 refills | Status: DC

## 2017-09-23 MED ORDER — FOLIC ACID 1 MG PO TABS: 1.0000 mg | ORAL_TABLET | Freq: Every day | ORAL | Status: DC

## 2017-09-23 MED ORDER — POTASSIUM CHLORIDE ER 10 MEQ PO TBCR: 10.0000 meq | EXTENDED_RELEASE_TABLET | Freq: Every day | ORAL | 0 refills | Status: DC

## 2017-09-23 NOTE — Discharge Summary (Signed)
Physician Discharge Summary  Carl Humphrey SWF:[id number] DOB: [birth year] DOA: 09/21/2017  PCP: Thressa Sheller, MD  Admit date: 09/21/2017 Discharge date: 09/23/2017  Admitted From: Home Disposition: Home Recommendations for Outpatient Follow-up:  1. Follow up with PCP in 1-2 weeks 2. Please obtain BMP/CBC in one week 3. Please follow up on the following pending results:echo and abi.  Home Health: None  None equipment/Devices Discharge Condition: Stable CODE STATUS: Full code Diet recommendation: Cardiac Brief/Interim Summary:Narrative61 y.o.malewith medical history significant of A.fibHTN, hypertriglyceridemia, OSA on CPAP, hypothyroidism, anxiety, GERD, tobacco abuse, heavy ETOH use, GERD   Presented with3 days of exertional dyspnea.Reports he was coughing up phlegm and it would not come up he coughed a lot. His mother was in the hospital Sunday he is unsure with what. Had significant cough and been sorethought maybe he pulled a muscle. Reports chest pain/abdominal painworse with exertion. Been having non-productive sputum for the past 3 days. Does have chronic lower extremity swelling. He reports his legs were getting tired after just A bit of walking. no fevers or chills. Last travel history was to the beach last week. He recently has gained 10 pounds but then was able to lose 5. This week if he lays down flat he feels short of breath and wheezing. Denies history of coronary artery disease. Reports drinks 6 beers a day have been for years but reports no hx of et oh withdrawal last beer was yesterday.  Wife reports more fatigued.    Discharge Diagnoses:  Active Problems:   Hyperlipidemia   Obstructive sleep apnea   Hypothyroidism   GERD (gastroesophageal reflux disease)   ETOH abuse   Atrial fibrillation (HCC)   CAP (community acquired pneumonia)   Chest pain   Pleural effusion   Claudication ([place])  1]cap-he did well on iv antibiotics.ambulated in hall  way with out difficulty.oxygen sats above 94% on room air.will dc on augmentin for 7 days.  2]atypical chest pain follow up with cardiology  3]chronic afib continue metoprolol.  4]osa uses cpap at home.  5]claudication needs follow up with vascular as an out patient.  6]hypothyroidsm continue synthroid.  7]pulmonary edema new onset dc on lasix and kdur.follow up echo pending.  Discharge Instructions  Discharge Instructions    Call MD for:  difficulty breathing, headache or visual disturbances   Complete by:  As directed    Call MD for:  persistant dizziness or light-headedness   Complete by:  As directed    Call MD for:  persistant nausea and vomiting   Complete by:  As directed    Call MD for:  severe uncontrolled pain   Complete by:  As directed    Diet - low sodium heart healthy   Complete by:  As directed    Increase activity slowly   Complete by:  As directed      Allergies as of 09/23/2017   No Known Allergies     Medication List    TAKE these medications   ALPRAZolam 0.5 MG tablet Commonly known as:  XANAX Take 0.25-0.5 mg by mouth daily as needed.   amoxicillin-clavulanate 875-125 MG tablet Commonly known as:  AUGMENTIN Take 1 tablet by mouth 2 (two) times daily for 7 days.   aspirin EC 81 MG tablet Take 1 tablet (81 mg total) by mouth daily.   fenofibrate 160 MG tablet Take 160 mg by mouth daily.   Fish Oil 1000 MG Caps Take by mouth daily.   folic acid 1 MG tablet Commonly known  as:  FOLVITE Take 1 tablet (1 mg total) by mouth daily. Start taking on:  09/24/2017   furosemide 20 MG tablet Commonly known as:  LASIX Take 1 tablet (20 mg total) by mouth daily.   levothyroxine 88 MCG tablet Commonly known as:  SYNTHROID, LEVOTHROID Take 88 mcg by mouth daily.   metoprolol tartrate 50 MG tablet Commonly known as:  LOPRESSOR TAKE 1 TABLET BY MOUTH TWICE A DAY   MULTIVITAMIN PO Take by mouth daily.   oxymetazoline 0.05 % nasal spray Commonly  known as:  AFRIN Place 1 spray into both nostrils 2 (two) times daily.   pantoprazole 40 MG tablet Commonly known as:  PROTONIX Take 40 mg by mouth daily.   potassium chloride 10 MEQ tablet Commonly known as:  K-DUR Take 1 tablet (10 mEq total) by mouth daily.   thiamine 100 MG tablet Take 1 tablet (100 mg total) by mouth daily. Start taking on:  09/24/2017   valsartan 320 MG tablet Commonly known as:  DIOVAN Take 320 mg by mouth daily.   venlafaxine XR 150 MG 24 hr capsule Commonly known as:  EFFEXOR-XR Take 300 mg by mouth daily.   VITAMIN D PO Take by mouth daily.   zolpidem 10 MG tablet Commonly known as:  AMBIEN Take 10 mg by mouth at bedtime.      Follow-up Information    Thressa Sheller, MD Follow up.   Specialty:  Internal Medicine Contact information: [place], [place] [address] [phone]        Dorothy Spark, MD .   Specialty:  Cardiology Contact information: [place] [address] [phone]          No Known Allergies  Consultations:  chmg   Procedures/Studies: Dg Chest 2 View  Result Date: 09/21/2017 CLINICAL DATA:  Persistent cough and shortness of breath. EXAM: CHEST - 2 VIEW COMPARISON:  None. FINDINGS: The heart is at the upper limits of normal in size. Normal mediastinal contours. Mild increased peripheral interstitial markings at the lung bases. Trace bilateral pleural effusions. Right basilar atelectasis. No pneumothorax. No acute osseous abnormality. IMPRESSION: 1. Mild interstitial edema and trace bilateral pleural effusions. Electronically Signed   By: Titus Dubin M.D.   On: 09/21/2017 16:05   Ct Angio Chest Pe W/cm &/or Wo Cm  Result Date: 09/21/2017 CLINICAL DATA:  Shortness of breath for 3 days. EXAM: CT ANGIOGRAPHY CHEST WITH CONTRAST TECHNIQUE: Multidetector CT imaging of the chest was performed using the standard protocol during bolus administration of  intravenous contrast. Multiplanar CT image reconstructions and MIPs were obtained to evaluate the vascular anatomy. CONTRAST:  17mL ISOVUE-370 IOPAMIDOL (ISOVUE-370) INJECTION 76% COMPARISON:  Two-view chest x-ray of the same day. FINDINGS: Cardiovascular: The heart size is normal. Coronary artery calcifications are present. Atherosclerotic changes are noted at the aortic arch. Great vessel origins are within normal limits. Pulmonary artery opacification is satisfactory. There are no focal filling defects to suggest pulmonary embolus. Mediastinum/Nodes: No significant mediastinal, hilar, or axillary adenopathy. Thoracic inlet is within normal limits. Visualized thyroid is unremarkable. Lungs/Pleura: Right greater than left pleural effusions are present. There is mild dependent atelectasis. Ill-defined right middle lobe airspace opacities are noted. Interlobular septal thickening is present in the right middle and right lower lobes. Mild dependent atelectasis is present on the left. Upper Abdomen: Limited imaging of the upper abdomen is unremarkable. Musculoskeletal: Vertebral body heights alignment are normal. No focal lytic or blastic lesions are  present. Sternum is within normal limits. Ribs are unremarkable. Review of the MIP images confirms the above findings. IMPRESSION: 1. No pulmonary embolus. 2. Right greater than left pleural effusions. 3. Scattered right middle lobe airspace disease concerning for infection. 4. Smaller left pleural effusion and mild atelectasis. 5. Mild interlobular septal thickening suggesting some degree of edema. Electronically Signed   By: San Morelle M.D.   On: 09/21/2017 20:47    (Echo, Carotid, EGD, Colonoscopy, ERCP)    Subjective:   Discharge Exam: Vitals:   09/23/17 0406 09/23/17 0852  BP: (!) 153/85 140/80  Pulse: (!) 58 60  Resp: 19   Temp: 98 F (36.7 C)   SpO2: 96%    Vitals:   09/22/17 2050 09/23/17 0100 09/23/17 0406 09/23/17 0852  BP: (!)  153/86 136/82 (!) 153/85 140/80  Pulse: 65 (!) 58 (!) 58 60  Resp: 19 18 19    Temp: 98.3 F (36.8 C) 97.9 F (36.6 C) 98 F (36.7 C)   TempSrc: Oral Oral Oral   SpO2: 94% 95% 96%   Weight:   101.1 kg (222 lb 14.2 oz)   Height:        General: Pt is alert, awake, not in acute distress Cardiovascular: RRR, S1/S2 +, no rubs, no gallops Respiratory: CTA bilaterally, no wheezing, no rhonchi Abdominal: Soft, NT, ND, bowel sounds + Extremities: no edema, no cyanosis    The results of significant diagnostics from this hospitalization (including imaging, microbiology, ancillary and laboratory) are listed below for reference.     Microbiology: Recent Results (from the past 240 hour(s))  Culture, blood (routine x 2) Call MD if unable to obtain prior to antibiotics being given     Status: None (Preliminary result)   Collection Time: 09/22/17  2:31 AM  Result Value Ref Range Status   Specimen Description BLOOD RIGHT ANTECUBITAL  Final   Special Requests   Final    BOTTLES DRAWN AEROBIC AND ANAEROBIC Blood Culture adequate volume   Culture   Final    NO GROWTH 1 DAY Performed at [facility], [address] [place], [place] [address]    Report Status PENDING  Incomplete  Culture, blood (routine x 2) Call MD if unable to obtain prior to antibiotics being given     Status: None (Preliminary result)   Collection Time: 09/22/17  2:38 AM  Result Value Ref Range Status   Specimen Description BLOOD RIGHT HAND  Final   Special Requests IN PEDIATRIC BOTTLE Blood Culture adequate volume  Final   Culture   Final    NO GROWTH 1 DAY Performed at [facility], [place] [address] [place], [place] [address]    Report Status PENDING  Incomplete     Labs: BNP (last 3 results) Recent Labs    09/21/17 1838  BNP 465.0*   Basic Metabolic Panel: Recent Labs  Lab 09/21/17 1838 09/22/17 0521 09/23/17 0445  NA 144 138 140  K 4.0 3.7 3.7  CL 106 99* 101  CO2 27 29 28   GLUCOSE 113*  104* 90  BUN 15 12 20   CREATININE 0.94 1.00 1.04  CALCIUM 9.8 9.3 9.1  MG  --  1.7 2.1  PHOS  --  4.0  --    Liver Function Tests: Recent Labs  Lab 09/21/17 1838 09/22/17 0521  AST 64* 71*  ALT 32 31  ALKPHOS 61 49  BILITOT 0.7 0.8  PROT 7.8 7.0  ALBUMIN 3.7 3.2*   Recent  Labs  Lab 09/21/17 1838  LIPASE 38   No results for input(s): AMMONIA in the last 168 hours. CBC: Recent Labs  Lab 09/21/17 1838 09/22/17 0521  WBC 8.8 6.9  NEUTROABS 6.7  --   HGB 13.9 12.6*  HCT 42.4 38.7*  MCV 92.6 91.1  PLT 199 173   Cardiac Enzymes: Recent Labs  Lab 09/21/17 2315 09/22/17 0521 09/22/17 1019 09/22/17 1618  TROPONINI <0.03 <0.03 <0.03 <0.03   BNP: Invalid input(s): POCBNP CBG: No results for input(s): GLUCAP in the last 168 hours. D-Dimer No results for input(s): DDIMER in the last 72 hours. Hgb A1c No results for input(s): HGBA1C in the last 72 hours. Lipid Profile Recent Labs    09/22/17 0521  CHOL 175  HDL 64  LDLCALC 95  TRIG 81  CHOLHDL 2.7   Thyroid function studies Recent Labs    09/22/17 0521  TSH 3.719   Anemia work up No results for input(s): VITAMINB12, FOLATE, FERRITIN, TIBC, IRON, RETICCTPCT in the last 72 hours. Urinalysis    Component Value Date/Time   COLORURINE YELLOW 09/21/2017 1838   APPEARANCEUR CLEAR 09/21/2017 1838   LABSPEC 1.020 09/21/2017 1838   PHURINE 6.0 09/21/2017 1838   GLUCOSEU NEGATIVE 09/21/2017 1838   HGBUR NEGATIVE 09/21/2017 1838   BILIRUBINUR NEGATIVE 09/21/2017 1838   KETONESUR NEGATIVE 09/21/2017 1838   PROTEINUR NEGATIVE 09/21/2017 1838   NITRITE NEGATIVE 09/21/2017 1838   LEUKOCYTESUR NEGATIVE 09/21/2017 1838   Sepsis Labs Invalid input(s): PROCALCITONIN,  WBC,  LACTICIDVEN Microbiology Recent Results (from the past 240 hour(s))  Culture, blood (routine x 2) Call MD if unable to obtain prior to antibiotics being given     Status: None (Preliminary result)   Collection Time: 09/22/17  2:31 AM   Result Value Ref Range Status   Specimen Description BLOOD RIGHT ANTECUBITAL  Final   Special Requests   Final    BOTTLES DRAWN AEROBIC AND ANAEROBIC Blood Culture adequate volume   Culture   Final    NO GROWTH 1 DAY Performed at [facility], [place] [address], [place], [place] [address]    Report Status PENDING  Incomplete  Culture, blood (routine x 2) Call MD if unable to obtain prior to antibiotics being given     Status: None (Preliminary result)   Collection Time: 09/22/17  2:38 AM  Result Value Ref Range Status   Specimen Description BLOOD RIGHT HAND  Final   Special Requests IN PEDIATRIC BOTTLE Blood Culture adequate volume  Final   Culture   Final    NO GROWTH 1 DAY Performed at [facility], [place] [address], [place], [place] [address]    Report Status PENDING  Incomplete     Time coordinating discharge: Over 36 minutes  SIGNED:   Georgette Shell, MD  [facility] 09/23/2017, 11:54 AM Pager   If 7PM-7AM, please contact night-coverage [email or web address] Password [email or web address]

## 2017-09-23 NOTE — Progress Notes (Signed)
Progress Note  Patient Name: Carl Humphrey Date of Encounter: 09/23/2017  Primary Cardiologist:   Ena Dawley, MD   Subjective   He was breathing much better yesterday after diuresis.    Inpatient Medications    Scheduled Meds: . aspirin EC  81 mg Oral Daily  . enoxaparin (LOVENOX) injection  40 mg Subcutaneous Q24H  . fenofibrate  160 mg Oral Daily  . folic acid  1 mg Oral Daily  . furosemide  40 mg Oral Daily  . irbesartan  300 mg Oral Daily  . levothyroxine  88 mcg Oral QAC breakfast  . metoprolol tartrate  50 mg Oral BID  . multivitamin with minerals  1 tablet Oral Daily  . oxymetazoline  1 spray Each Nare BID  . pantoprazole  40 mg Oral Daily  . sodium chloride flush  3 mL Intravenous Q12H  . thiamine  100 mg Oral Daily   Or  . thiamine  100 mg Intravenous Daily  . venlafaxine XR  300 mg Oral Daily   Continuous Infusions: . sodium chloride    . azithromycin Stopped (09/23/17 0222)  . cefTRIAXone (ROCEPHIN)  IV Stopped (09/23/17 0301)   PRN Meds: sodium chloride, acetaminophen **OR** acetaminophen, HYDROcodone-acetaminophen, LORazepam **OR** LORazepam, ondansetron **OR** ondansetron (ZOFRAN) IV, sodium chloride flush, zolpidem   Vital Signs    Vitals:   09/22/17 2050 09/23/17 0100 09/23/17 0406 09/23/17 0852  BP: (!) 153/86 136/82 (!) 153/85 140/80  Pulse: 65 (!) 58 (!) 58 60  Resp: 19 18 19    Temp: 98.3 F (36.8 C) 97.9 F (36.6 C) 98 F (36.7 C)   TempSrc: Oral Oral Oral   SpO2: 94% 95% 96%   Weight:   222 lb 14.2 oz (101.1 kg)   Height:        Intake/Output Summary (Last 24 hours) at 09/23/2017 0946 Last data filed at 09/23/2017 0600 Gross per 24 hour  Intake 1050 ml  Output 2000 ml  Net -950 ml   Filed Weights   09/21/17 1533 09/22/17 0132 09/23/17 0406  Weight: 230 lb (104.3 kg) 228 lb 12.8 oz (103.8 kg) 222 lb 14.2 oz (101.1 kg)    Telemetry    SB- Personally Reviewed  ECG    NA - Personally Reviewed  Physical Exam   GEN: No  acute distress.   Neck: No  JVD Cardiac: RRR, no murmurs, rubs, or gallops.  Respiratory: Clear  to auscultation bilaterally. GI: Soft, nontender, non-distended  MS: No  edema; No deformity. Neuro:  Nonfocal  Psych: Normal affect   Labs    Chemistry Recent Labs  Lab 09/21/17 1838 09/22/17 0521 09/23/17 0445  NA 144 138 140  K 4.0 3.7 3.7  CL 106 99* 101  CO2 27 29 28   GLUCOSE 113* 104* 90  BUN 15 12 20   CREATININE 0.94 1.00 1.04  CALCIUM 9.8 9.3 9.1  PROT 7.8 7.0  --   ALBUMIN 3.7 3.2*  --   AST 64* 71*  --   ALT 32 31  --   ALKPHOS 61 49  --   BILITOT 0.7 0.8  --   GFRNONAA >60 >60 >60  GFRAA >60 >60 >60  ANIONGAP 11 10 11      Hematology Recent Labs  Lab 09/21/17 1838 09/22/17 0521  WBC 8.8 6.9  RBC 4.58 4.25  HGB 13.9 12.6*  HCT 42.4 38.7*  MCV 92.6 91.1  MCH 30.3 29.6  MCHC 32.8 32.6  RDW 13.6 13.4  PLT 199 173  Cardiac Enzymes Recent Labs  Lab 09/21/17 2315 09/22/17 0521 09/22/17 1019 09/22/17 1618  TROPONINI <0.03 <0.03 <0.03 <0.03    Recent Labs  Lab 09/21/17 1843  TROPIPOC 0.00     BNP Recent Labs  Lab 09/21/17 1838  BNP 359.7*     DDimer No results for input(s): DDIMER in the last 168 hours.   Radiology    Dg Chest 2 View  Result Date: 09/21/2017 CLINICAL DATA:  Persistent cough and shortness of breath. EXAM: CHEST - 2 VIEW COMPARISON:  None. FINDINGS: The heart is at the upper limits of normal in size. Normal mediastinal contours. Mild increased peripheral interstitial markings at the lung bases. Trace bilateral pleural effusions. Right basilar atelectasis. No pneumothorax. No acute osseous abnormality. IMPRESSION: 1. Mild interstitial edema and trace bilateral pleural effusions. Electronically Signed   By: Titus Dubin M.D.   On: 09/21/2017 16:05   Ct Angio Chest Pe W/cm &/or Wo Cm  Result Date: 09/21/2017 CLINICAL DATA:  Shortness of breath for 3 days. EXAM: CT ANGIOGRAPHY CHEST WITH CONTRAST TECHNIQUE: Multidetector CT  imaging of the chest was performed using the standard protocol during bolus administration of intravenous contrast. Multiplanar CT image reconstructions and MIPs were obtained to evaluate the vascular anatomy. CONTRAST:  177mL ISOVUE-370 IOPAMIDOL (ISOVUE-370) INJECTION 76% COMPARISON:  Two-view chest x-ray of the same day. FINDINGS: Cardiovascular: The heart size is normal. Coronary artery calcifications are present. Atherosclerotic changes are noted at the aortic arch. Great vessel origins are within normal limits. Pulmonary artery opacification is satisfactory. There are no focal filling defects to suggest pulmonary embolus. Mediastinum/Nodes: No significant mediastinal, hilar, or axillary adenopathy. Thoracic inlet is within normal limits. Visualized thyroid is unremarkable. Lungs/Pleura: Right greater than left pleural effusions are present. There is mild dependent atelectasis. Ill-defined right middle lobe airspace opacities are noted. Interlobular septal thickening is present in the right middle and right lower lobes. Mild dependent atelectasis is present on the left. Upper Abdomen: Limited imaging of the upper abdomen is unremarkable. Musculoskeletal: Vertebral body heights alignment are normal. No focal lytic or blastic lesions are present. Sternum is within normal limits. Ribs are unremarkable. Review of the MIP images confirms the above findings. IMPRESSION: 1. No pulmonary embolus. 2. Right greater than left pleural effusions. 3. Scattered right middle lobe airspace disease concerning for infection. 4. Smaller left pleural effusion and mild atelectasis. 5. Mild interlobular septal thickening suggesting some degree of edema. Electronically Signed   By: San Morelle M.D.   On: 09/21/2017 20:47    Cardiac Studies   ECHO is pending  Patient Profile     62 y.o. male with a hx of HTN, HLD, OSA on CPAP, hypothyroidism, anxiety, GERD, tobacco abuse, heavy ETOH, and paroxysmal atrial fibrillation  use who is being seen for the evaluation of chest pain and SOB at the request of Dr. Rodena Piety.  Assessment & Plan    CHEST PAIN:  No objective evidence of ischemia and pain is atypical.  Plan out patient ischemia work up as recorded below.    PAF:  NSR.  No change in therapy.  DYSPNEA:  Treated initially with IV diuretic.    Negative 2.26 liters this admission.  I would suggest sending home on Lasix 20 mg.  Discussed salt and fluid restriction.   We will see him as an out patient and then arrange an out patient CTA for ischemia work up.  From my standpoint he can go home after his echo if it is OK.  For questions or updates, please contact [place] Please consult [email or web address] for contact info under Cardiology/STEMI.   Signed, Minus Breeding, MD  09/23/2017, 9:46 AM

## 2017-09-23 NOTE — Progress Notes (Signed)
Pt got discharged to home, discharge instructions provided and patient showed understanding to it, IV taken out,Telemonitor DC,pt left unit in wheelchair with all of the belongings accompanied with a family member (wife)  Dessie Tatem,RN 

## 2017-09-23 NOTE — Progress Notes (Signed)
  Echocardiogram 2D Echocardiogram has been performed.  Jennette Dubin 09/23/2017, 11:33 AM

## 2017-09-23 NOTE — Progress Notes (Signed)
VASCULAR LAB PRELIMINARY  ARTERIAL  ABI completed:ABIs and waveforms are within normal limits.     RIGHT    LEFT    PRESSURE WAVEFORM  PRESSURE WAVEFORM  BRACHIAL 181 T 184 T   DP 197 T DP 211 T  AT   AT    PT 205 T PT 211 T  PER   PER    GREAT TOE  NA GREAT TOE  NA    RIGHT LEFT  ABI 1.11 1.15     Kahleel Fadeley, RVT 09/23/2017, 11:58 AM

## 2017-09-24 ENCOUNTER — Telehealth: Payer: Self-pay | Admitting: Cardiology

## 2017-09-24 NOTE — Telephone Encounter (Signed)
New Message:       Pt is calling to set up a 1 wk hosp f/u with Meda Coffee and pt does not want to see a PA.

## 2017-09-24 NOTE — Telephone Encounter (Signed)
Pt recently discharged from the hospital for primary diagnosis of pneumonia, pleural effusion, atypical chest pain, chronic afib, and pulmonary edema.  Pt was told but was told to follow-up with PCP, Cardiology, and Vascular  within the week or so.  Pt had an echo and ABIs done in the hospital.  Results were pending at discharge from the hospital.  Scheduled the pt to see Estella Husk PA-C for next Tuesday 10/02/17 at 1130.  Advised the pt to arrive 15 mins prior to this appt.  Pt verbalized understanding and agrees with this plan.  Will send this message to Dr Meda Coffee as a general [place].

## 2017-09-27 LAB — CULTURE, BLOOD (ROUTINE X 2)
CULTURE: NO GROWTH
Culture: NO GROWTH
SPECIAL REQUESTS: ADEQUATE
SPECIAL REQUESTS: ADEQUATE

## 2017-10-01 DIAGNOSIS — I5032 Chronic diastolic (congestive) heart failure: Secondary | ICD-10-CM | POA: Insufficient documentation

## 2017-10-01 NOTE — Progress Notes (Signed)
Cardiology Office Note    Date:  10/02/2017   ID:  Carl Humphrey, DOB Aug 01, 1955, MRN [id number]  PCP:  Thressa Sheller, MD  Cardiologist: Ena Dawley, MD  Chief Complaint  Patient presents with  . Hospitalization Follow-up    History of Present Illness:  Carl Humphrey is a 62 y.o. male with history of PAF hypertension, HLD, tobacco abuse, heavy EtOH, OSA on CPAP, hypothyroidism.    Discharge from the hospital 09/23/2017 after admission with atypical chest pain negative troponins and EKG, had CHF and pneumonia.  Plan was for outpatient ischemic work-up with coronary CT.  Also had some CHF treated with IV Lasix and diuresed 2.26 L.  2D echo showed normal LVEF 50% with grade 2 DD Doppler parameters consistent with high ventricular filling pressures, mild MR, left atrium mildly dilated.  Patient comes in today accompanied by his wife.  He denies any further fluid buildup.  He is weighing daily and has lost about 16 pounds since his hospitalization.  He canceled his coronary CT scan but needs to reschedule it.  Denies any chest pain, palpitations, dyspnea, dyspnea exertion, dizziness or presyncope.  Continues to drink 5-6 light beers daily.    Past Medical History:  Diagnosis Date  . Anxiety   . Atrial fibrillation ([place])    a. Dx 08/13/2015;  b. CHA2DS2VASc = 1.  . ETOH abuse   . GERD (gastroesophageal reflux disease)   . Hypertensive heart disease   . Hypertriglyceridemia   . Hypothyroidism   . Obstructive sleep apnea    a. On CPAP x ~ 10 yrs.    Past Surgical History:  Procedure Laterality Date  . no prior surgery      Current Medications: Current Meds  Medication Sig  . ALPRAZolam (XANAX) 0.5 MG tablet Take 0.25-0.5 mg by mouth daily as needed.  [name] aspirin EC 81 MG tablet Take 1 tablet (81 mg total) by mouth daily.  . Cholecalciferol (VITAMIN D PO) Take by mouth daily.  . fenofibrate 160 MG tablet Take 160 mg by mouth daily.   . folic acid (FOLVITE) 1 MG tablet Take  1 tablet (1 mg total) by mouth daily.  . furosemide (LASIX) 20 MG tablet Take 1 tablet (20 mg total) by mouth daily.  [name] levothyroxine (SYNTHROID, LEVOTHROID) 88 MCG tablet Take 88 mcg by mouth daily.  . metoprolol tartrate (LOPRESSOR) 50 MG tablet TAKE 1 TABLET BY MOUTH TWICE A DAY  . Multiple Vitamins-Minerals (MULTIVITAMIN PO) Take by mouth daily.  . Omega-3 Fatty Acids (FISH OIL) 1000 MG CAPS Take by mouth daily.  [name] oxymetazoline (AFRIN) 0.05 % nasal spray Place 1 spray into both nostrils 2 (two) times daily.  . potassium chloride (K-DUR) 10 MEQ tablet Take 1 tablet (10 mEq total) by mouth daily.  [name] thiamine 100 MG tablet Take 1 tablet (100 mg total) by mouth daily.  . valsartan (DIOVAN) 320 MG tablet Take 320 mg by mouth daily.  [name] venlafaxine XR (EFFEXOR-XR) 150 MG 24 hr capsule Take 300 mg by mouth daily.   [name] zolpidem (AMBIEN) 10 MG tablet Take 10 mg by mouth at bedtime.  . [DISCONTINUED] pantoprazole (PROTONIX) 40 MG tablet Take 40 mg by mouth daily.     Allergies:   Patient has no known allergies.   Social History   Socioeconomic History  . Marital status: Unknown    Spouse name: Not on file  . Number of children: Not on file  . Years of education: Not on file  .  Highest education level: Not on file  Occupational History  . Not on file  Social Needs  . Financial resource strain: Not on file  . Food insecurity:    Worry: Not on file    Inability: Not on file  . Transportation needs:    Medical: Not on file    Non-medical: Not on file  Tobacco Use  . Smoking status: Former Smoker    Last attempt to quit: 09/18/1997    Years since quitting: 20.0  . Smokeless tobacco: Former Systems developer    Quit date: 09/18/1997  Substance and Sexual Activity  . Alcohol use: Yes    Comment: He drinks 8-10 beers/day.  . Drug use: Yes    Types: Marijuana    Comment: uses marijuana 1 x month  . Sexual activity: Not on file  Lifestyle  . Physical activity:    Days per week: Not on file     Minutes per session: Not on file  . Stress: Not on file  Relationships  . Social connections:    Talks on phone: Not on file    Gets together: Not on file    Attends religious service: Not on file    Active member of club or organization: Not on file    Attends meetings of clubs or organizations: Not on file    Relationship status: Not on file  Other Topics Concern  . Not on file  Social History Narrative   Lives in [place] with his wife.  He walks his dog about 2.5 miles/day w/o limitations.  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     Family History:  The patient's family history includes Hypertension in his father and mother; Multiple sclerosis in his sister; Other in his sister and sister.   ROS:   Please see the history of present illness.    Review of Systems  Constitution: Negative.  HENT: Negative.   Cardiovascular: Negative.   Respiratory: Negative.   Endocrine: Negative.   Hematologic/Lymphatic: Negative.   Musculoskeletal: Negative.   Gastrointestinal: Negative.   Genitourinary: Negative.   Neurological: Negative.    All other systems reviewed and are negative.   PHYSICAL EXAM:   VS:  BP 122/68 (BP Location: Right Arm, Patient Position: Sitting, Cuff Size: Normal)   Pulse 60   Ht 5\' 8"  (1.727 m)   Wt 218 lb (98.9 kg)   SpO2 97%   BMI 33.15 kg/m   Physical Exam  GEN: Well nourished, well developed, in no acute distress  Neck: no JVD, carotid bruits, or masses Cardiac:RRR; no murmurs, rubs, or gallops  Respiratory:  clear to auscultation bilaterally, normal work of breathing GI: soft, nontender, nondistended, + BS Ext: without cyanosis, clubbing, or edema, Good distal pulses bilaterally Neuro:  Alert and Oriented x 3 Psych: euthymic mood, full affect  Wt Readings from Last 3 Encounters:  10/02/17 218 lb (98.9 kg)  09/23/17 222 lb 14.2 oz (101.1 kg)  03/22/17 226 lb (102.5 kg)      Studies/Labs Reviewed:   EKG:  EKG is  ordered today.  The ekg ordered today demonstrates sinus bradycardia at 57 bpm with incomplete right bundle branch block unchanged from hospital 09/23/2017  Recent Labs: 09/21/2017: B Natriuretic Peptide 359.7 09/22/2017: ALT 31; Hemoglobin 12.6; Platelets 173; TSH 3.719 09/23/2017: BUN 20; Creatinine, Ser 1.04; Magnesium 2.1; Potassium 3.7; Sodium 140   Lipid Panel    Component Value Date/Time   CHOL 175 09/22/2017 0521   TRIG 81  09/22/2017 0521   HDL 64 09/22/2017 0521   CHOLHDL 2.7 09/22/2017 0521   VLDL 16 09/22/2017 0521   LDLCALC 95 09/22/2017 0521    Additional studies/ records that were reviewed today include:  2D echo 09/23/2017 Study Conclusions   - Left ventricle: The cavity size was mildly dilated. Wall   thickness was increased in a pattern of mild LVH. Systolic   function was at the lower limits of normal. The estimated   ejection fraction was 50%. Wall motion was normal; there were no   regional wall motion abnormalities. Features are consistent with   a pseudonormal left ventricular filling pattern, with concomitant   abnormal relaxation and increased filling pressure (grade 2   diastolic dysfunction). Doppler parameters are consistent with   high ventricular filling pressure. - Mitral valve: There was mild regurgitation. - Left atrium: The atrium was mildly dilated. - Right atrium: The atrium was mildly dilated. - Atrial septum: No defect or patent foramen ovale was identified.   ABI's 09/23/17 Final Interpretation: Right: Resting right ankle-brachial index is within normal range. No evidence of significant right lower extremity arterial disease. Left: Resting left ankle-brachial index is within normal range. No evidence of significant left lower extremity arterial disease.     ASSESSMENT:    1. Chest pain, unspecified type   2. PAF (paroxysmal atrial fibrillation) ([place])   3. Hypertensive heart disease with chronic diastolic congestive heart failure ([place])   4.  Mixed hyperlipidemia   5. ETOH abuse   6. Chronic diastolic CHF (congestive heart failure) ([facility])      PLAN:  In order of problems listed above:  Chest pain in the hospital felt to be atypical.  Plan for outpatient coronary CT for further evaluate.  2D echo did not show any wall motion abnormality and normal LVEF.  Patient canceled the CT scan but will reschedule it today.  No further chest pain.  Chronic diastolic CHF with grade 2 DD on echo diuresed over 2 L in the hospital.  Has trouble following a low-sodium diet but went over in detail with patient.  Will give 2 g sodium diet.  If follows closely could probably take Lasix and potassium as needed.  Follow-up with Dr. Meda Coffee 2 to 3 months.  Follow-up labs today.  PAF was in normal sinus rhythm in the hospital.  Normal sinus rhythm today  Hypertensive heart disease with chronic diastolic CHF blood pressure well controlled  Mixed hyperlipidemia on fenofibrate LDL 95 09/22/2017  Heavy EtOH-still drinking 5-6 beers daily.  Decrease in alcohol consumption recommended.    Medication Adjustments/Labs and Tests Ordered: Current medicines are reviewed at length with the patient today.  Concerns regarding medicines are outlined above.  Medication changes, Labs and Tests ordered today are listed in the Patient Instructions below. Patient Instructions  Medication Instructions:  Your physician recommends that you continue on your current medications as directed. Please refer to the Current Medication list given to you today.  Labwork: Your physician recommends that you have lab work today- BMET and CBC.   Testing/Procedures: Your physician has requested that you have cardiac CT. Cardiac computed tomography (CT) is a painless test that uses an x-ray machine to take clear, detailed pictures of your heart. For further information please visit [facility]uge[facility]iest[facility][email or web address]. Please follow instruction sheet as given.  Follow-Up: Your physician wants you to  follow-up in: 2 to 3 months with Dr. Meda Coffee.   Your physician wants you to decrease your alcohol intake.   If you  need a refill on your cardiac medications before your next appointment, please call your pharmacy.    Low-Sodium Eating Plan Sodium, which is an element that makes up salt, helps you maintain a healthy balance of fluids in your body. Too much sodium can increase your blood pressure and cause fluid and waste to be held in your body. Your health care provider or dietitian may recommend following this plan if you have high blood pressure (hypertension), kidney disease, liver disease, or heart failure. Eating less sodium can help lower your blood pressure, reduce swelling, and protect your heart, liver, and kidneys. What are tips for following this plan? General guidelines  Most people on this plan should limit their sodium intake to 1,500-2,000 mg (milligrams) of sodium each day. Reading food labels  The Nutrition Facts label lists the amount of sodium in one serving of the food. If you eat more than one serving, you must multiply the listed amount of sodium by the number of servings.  Choose foods with less than 140 mg of sodium per serving.  Avoid foods with 300 mg of sodium or more per serving. Shopping  Look for lower-sodium products, often labeled as "low-sodium" or "no salt added."  Always check the sodium content even if foods are labeled as "unsalted" or "no salt added".  Buy fresh foods. ? Avoid canned foods and premade or frozen meals. ? Avoid canned, cured, or processed meats  Buy breads that have less than 80 mg of sodium per slice. Cooking  Eat more home-cooked food and less restaurant, buffet, and fast food.  Avoid adding salt when cooking. Use salt-free seasonings or herbs instead of table salt or sea salt. Check with your health care provider or pharmacist before using salt substitutes.  Cook with plant-based oils, such as canola, sunflower, or olive  oil. Meal planning  When eating at a restaurant, ask that your food be prepared with less salt or no salt, if possible.  Avoid foods that contain MSG (monosodium glutamate). MSG is sometimes added to [place] food, bouillon, and some canned foods. What foods are recommended? The items listed may not be a complete list. Talk with your dietitian about what dietary choices are best for you. Grains Low-sodium cereals, including oats, puffed wheat and rice, and shredded wheat. Low-sodium crackers. Unsalted rice. Unsalted pasta. Low-sodium bread. Whole-grain breads and whole-grain pasta. Vegetables Fresh or frozen vegetables. "No salt added" canned vegetables. "No salt added" tomato sauce and paste. Low-sodium or reduced-sodium tomato and vegetable juice. Fruits Fresh, frozen, or canned fruit. Fruit juice. Meats and other protein foods Fresh or frozen (no salt added) meat, poultry, seafood, and fish. Low-sodium canned tuna and salmon. Unsalted nuts. Dried peas, beans, and lentils without added salt. Unsalted canned beans. Eggs. Unsalted nut butters. Dairy Milk. Soy milk. Cheese that is naturally low in sodium, such as ricotta cheese, fresh mozzarella, or Swiss cheese Low-sodium or reduced-sodium cheese. Cream cheese. Yogurt. Fats and oils Unsalted butter. Unsalted margarine with no trans fat. Vegetable oils such as canola or olive oils. Seasonings and other foods Fresh and dried herbs and spices. Salt-free seasonings. Low-sodium mustard and ketchup. Sodium-free salad dressing. Sodium-free light mayonnaise. Fresh or refrigerated horseradish. Lemon juice. Vinegar. Homemade, reduced-sodium, or low-sodium soups. Unsalted popcorn and pretzels. Low-salt or salt-free chips. What foods are not recommended? The items listed may not be a complete list. Talk with your dietitian about what dietary choices are best for you. Grains Instant hot cereals. Bread stuffing, pancake, and biscuit mixes.  Croutons.  Seasoned rice or pasta mixes. Noodle soup cups. Boxed or frozen macaroni and cheese. Regular salted crackers. Self-rising flour. Vegetables Sauerkraut, pickled vegetables, and relishes. Olives. [place] fries. Onion rings. Regular canned vegetables (not low-sodium or reduced-sodium). Regular canned tomato sauce and paste (not low-sodium or reduced-sodium). Regular tomato and vegetable juice (not low-sodium or reduced-sodium). Frozen vegetables in sauces. Meats and other protein foods Meat or fish that is salted, canned, smoked, spiced, or pickled. Bacon, ham, sausage, hotdogs, corned beef, chipped beef, packaged lunch meats, salt pork, jerky, pickled herring, anchovies, regular canned tuna, sardines, salted nuts. Dairy Processed cheese and cheese spreads. Cheese curds. Blue cheese. Feta cheese. String cheese. Regular cottage cheese. Buttermilk. Canned milk. Fats and oils Salted butter. Regular margarine. Ghee. Bacon fat. Seasonings and other foods Onion salt, garlic salt, seasoned salt, table salt, and sea salt. Canned and packaged gravies. Worcestershire sauce. Tartar sauce. Barbecue sauce. Teriyaki sauce. Soy sauce, including reduced-sodium. Steak sauce. Fish sauce. Oyster sauce. Cocktail sauce. Horseradish that you find on the shelf. Regular ketchup and mustard. Meat flavorings and tenderizers. Bouillon cubes. Hot sauce and Tabasco sauce. Premade or packaged marinades. Premade or packaged taco seasonings. Relishes. Regular salad dressings. Salsa. Potato and tortilla chips. Corn chips and puffs. Salted popcorn and pretzels. Canned or dried soups. Pizza. Frozen entrees and pot pies. Summary  Eating less sodium can help lower your blood pressure, reduce swelling, and protect your heart, liver, and kidneys.  Most people on this plan should limit their sodium intake to 1,500-2,000 mg (milligrams) of sodium each day.  Canned, boxed, and frozen foods are high in sodium. Restaurant foods, fast foods, and  pizza are also very high in sodium. You also get sodium by adding salt to food.  Try to cook at home, eat more fresh fruits and vegetables, and eat less fast food, canned, processed, or prepared foods. This information is not intended to replace advice given to you by your health care provider. Make sure you discuss any questions you have with your health care provider. Document Released: 10/28/2001 Document Revised: 05/01/2016 Document Reviewed: 05/01/2016 Elsevier Interactive Patient Education  2018 [place], Ermalinda Barrios, [place]  10/02/2017 12:03 PM    [address] [place], [place], [place]  [address] Phone: [phone]; Fax: [phone]

## 2017-10-02 ENCOUNTER — Encounter: Payer: Self-pay | Admitting: Physician Assistant

## 2017-10-02 ENCOUNTER — Ambulatory Visit: Payer: BC Managed Care – PPO | Admitting: Physician Assistant

## 2017-10-02 VITALS — BP 122/68 | HR 60 | Ht 68.0 in | Wt 218.0 lb

## 2017-10-02 DIAGNOSIS — I110 Hypertensive heart disease with heart failure: Secondary | ICD-10-CM

## 2017-10-02 DIAGNOSIS — F1010 Alcohol abuse, uncomplicated: Secondary | ICD-10-CM | POA: Diagnosis not present

## 2017-10-02 DIAGNOSIS — R0602 Shortness of breath: Secondary | ICD-10-CM

## 2017-10-02 DIAGNOSIS — I480 Paroxysmal atrial fibrillation: Secondary | ICD-10-CM

## 2017-10-02 DIAGNOSIS — R079 Chest pain, unspecified: Secondary | ICD-10-CM

## 2017-10-02 DIAGNOSIS — E782 Mixed hyperlipidemia: Secondary | ICD-10-CM

## 2017-10-02 DIAGNOSIS — I5032 Chronic diastolic (congestive) heart failure: Secondary | ICD-10-CM

## 2017-10-02 LAB — BASIC METABOLIC PANEL
BUN / CREAT RATIO: 20 (ref 10–24)
BUN: 21 mg/dL (ref 8–27)
CO2: 26 mmol/L (ref 20–29)
CREATININE: 1.04 mg/dL (ref 0.76–1.27)
Calcium: 10.2 mg/dL (ref 8.6–10.2)
Chloride: 100 mmol/L (ref 96–106)
GFR calc Af Amer: 90 mL/min/{1.73_m2} (ref >59)
GFR calc non Af Amer: 78 mL/min/{1.73_m2} (ref >59)
Glucose: 99 mg/dL (ref 65–99)
POTASSIUM: 4.6 mmol/L (ref 3.5–5.2)
SODIUM: 142 mmol/L (ref 134–144)

## 2017-10-02 LAB — CBC WITH DIFFERENTIAL/PLATELET
BASOS: 1 %
Basophils Absolute: 0 10*3/uL (ref 0.0–0.2)
EOS (ABSOLUTE): 0.1 10*3/uL (ref 0.0–0.4)
EOS: 1 %
Hematocrit: 48.4 % (ref 37.5–51.0)
Hemoglobin: 16.7 g/dL (ref 13.0–17.7)
Immature Grans (Abs): 0 10*3/uL (ref 0.0–0.1)
Immature Granulocytes: 0 %
LYMPHS ABS: 3.1 10*3/uL (ref 0.7–3.1)
Lymphs: 39 %
MCH: 29.9 pg (ref 26.6–33.0)
MCHC: 34.5 g/dL (ref 31.5–35.7)
MCV: 87 fL (ref 79–97)
MONOS ABS: 0.5 10*3/uL (ref 0.1–0.9)
Monocytes: 6 %
Neutrophils Absolute: 4.3 10*3/uL (ref 1.4–7.0)
Neutrophils: 53 %
Platelets: 270 10*3/uL (ref 150–379)
RBC: 5.58 x10E6/uL (ref 4.14–5.80)
RDW: 12.6 % (ref 12.3–15.4)
WBC: 8 10*3/uL (ref 3.4–10.8)

## 2017-10-02 NOTE — Patient Instructions (Addendum)
Medication Instructions:  Your physician recommends that you continue on your current medications as directed. Please refer to the Current Medication list given to you today.  Labwork: Your physician recommends that you have lab work today- BMET and CBC.   Testing/Procedures: Your physician has requested that you have cardiac CT. Cardiac computed tomography (CT) is a painless test that uses an x-ray machine to take clear, detailed pictures of your heart. For further information please visit HugeFiesta[facility][email or web address]. Please follow instruction sheet as given.  Follow-Up: Your physician wants you to follow-up in: 2 to 3 months with Dr. Meda Coffee.   Your physician wants you to decrease your alcohol intake.   If you need a refill on your cardiac medications before your next appointment, please call your pharmacy.    Low-Sodium Eating Plan Sodium, which is an element that makes up salt, helps you maintain a healthy balance of fluids in your body. Too much sodium can increase your blood pressure and cause fluid and waste to be held in your body. Your health care provider or dietitian may recommend following this plan if you have high blood pressure (hypertension), kidney disease, liver disease, or heart failure. Eating less sodium can help lower your blood pressure, reduce swelling, and protect your heart, liver, and kidneys. What are tips for following this plan? General guidelines  Most people on this plan should limit their sodium intake to 1,500-2,000 mg (milligrams) of sodium each day. Reading food labels  The Nutrition Facts label lists the amount of sodium in one serving of the food. If you eat more than one serving, you must multiply the listed amount of sodium by the number of servings.  Choose foods with less than 140 mg of sodium per serving.  Avoid foods with 300 mg of sodium or more per serving. Shopping  Look for lower-sodium products, often labeled as "low-sodium" or "no salt  added."  Always check the sodium content even if foods are labeled as "unsalted" or "no salt added".  Buy fresh foods. ? Avoid canned foods and premade or frozen meals. ? Avoid canned, cured, or processed meats  Buy breads that have less than 80 mg of sodium per slice. Cooking  Eat more home-cooked food and less restaurant, buffet, and fast food.  Avoid adding salt when cooking. Use salt-free seasonings or herbs instead of table salt or sea salt. Check with your health care provider or pharmacist before using salt substitutes.  Cook with plant-based oils, such as canola, sunflower, or olive oil. Meal planning  When eating at a restaurant, ask that your food be prepared with less salt or no salt, if possible.  Avoid foods that contain MSG (monosodium glutamate). MSG is sometimes added to [place] food, bouillon, and some canned foods. What foods are recommended? The items listed may not be a complete list. Talk with your dietitian about what dietary choices are best for you. Grains Low-sodium cereals, including oats, puffed wheat and rice, and shredded wheat. Low-sodium crackers. Unsalted rice. Unsalted pasta. Low-sodium bread. Whole-grain breads and whole-grain pasta. Vegetables Fresh or frozen vegetables. "No salt added" canned vegetables. "No salt added" tomato sauce and paste. Low-sodium or reduced-sodium tomato and vegetable juice. Fruits Fresh, frozen, or canned fruit. Fruit juice. Meats and other protein foods Fresh or frozen (no salt added) meat, poultry, seafood, and fish. Low-sodium canned tuna and salmon. Unsalted nuts. Dried peas, beans, and lentils without added salt. Unsalted canned beans. Eggs. Unsalted nut butters. Dairy Milk. Soy milk. Cheese that is naturally  low in sodium, such as ricotta cheese, fresh mozzarella, or Swiss cheese Low-sodium or reduced-sodium cheese. Cream cheese. Yogurt. Fats and oils Unsalted butter. Unsalted margarine with no trans fat. Vegetable  oils such as canola or olive oils. Seasonings and other foods Fresh and dried herbs and spices. Salt-free seasonings. Low-sodium mustard and ketchup. Sodium-free salad dressing. Sodium-free light mayonnaise. Fresh or refrigerated horseradish. Lemon juice. Vinegar. Homemade, reduced-sodium, or low-sodium soups. Unsalted popcorn and pretzels. Low-salt or salt-free chips. What foods are not recommended? The items listed may not be a complete list. Talk with your dietitian about what dietary choices are best for you. Grains Instant hot cereals. Bread stuffing, pancake, and biscuit mixes. Croutons. Seasoned rice or pasta mixes. Noodle soup cups. Boxed or frozen macaroni and cheese. Regular salted crackers. Self-rising flour. Vegetables Sauerkraut, pickled vegetables, and relishes. Olives. [place] fries. Onion rings. Regular canned vegetables (not low-sodium or reduced-sodium). Regular canned tomato sauce and paste (not low-sodium or reduced-sodium). Regular tomato and vegetable juice (not low-sodium or reduced-sodium). Frozen vegetables in sauces. Meats and other protein foods Meat or fish that is salted, canned, smoked, spiced, or pickled. Bacon, ham, sausage, hotdogs, corned beef, chipped beef, packaged lunch meats, salt pork, jerky, pickled herring, anchovies, regular canned tuna, sardines, salted nuts. Dairy Processed cheese and cheese spreads. Cheese curds. Blue cheese. Feta cheese. String cheese. Regular cottage cheese. Buttermilk. Canned milk. Fats and oils Salted butter. Regular margarine. Ghee. Bacon fat. Seasonings and other foods Onion salt, garlic salt, seasoned salt, table salt, and sea salt. Canned and packaged gravies. Worcestershire sauce. Tartar sauce. Barbecue sauce. Teriyaki sauce. Soy sauce, including reduced-sodium. Steak sauce. Fish sauce. Oyster sauce. Cocktail sauce. Horseradish that you find on the shelf. Regular ketchup and mustard. Meat flavorings and tenderizers. Bouillon cubes.  Hot sauce and Tabasco sauce. Premade or packaged marinades. Premade or packaged taco seasonings. Relishes. Regular salad dressings. Salsa. Potato and tortilla chips. Corn chips and puffs. Salted popcorn and pretzels. Canned or dried soups. Pizza. Frozen entrees and pot pies. Summary  Eating less sodium can help lower your blood pressure, reduce swelling, and protect your heart, liver, and kidneys.  Most people on this plan should limit their sodium intake to 1,500-2,000 mg (milligrams) of sodium each day.  Canned, boxed, and frozen foods are high in sodium. Restaurant foods, fast foods, and pizza are also very high in sodium. You also get sodium by adding salt to food.  Try to cook at home, eat more fresh fruits and vegetables, and eat less fast food, canned, processed, or prepared foods. This information is not intended to replace advice given to you by your health care provider. Make sure you discuss any questions you have with your health care provider. Document Released: 10/28/2001 Document Revised: 05/01/2016 Document Reviewed: 05/01/2016 Elsevier Interactive Patient Education  [employer].

## 2017-10-02 NOTE — Addendum Note (Signed)
Addended by: Aris Georgia, Destin Vinsant L on: 10/02/2017 12:30 PM   Modules accepted: Orders

## 2017-10-03 NOTE — Progress Notes (Signed)
Pt has been made aware of normal result and verbalized understanding.  jw 10/03/17

## 2017-10-12 ENCOUNTER — Telehealth: Payer: Self-pay | Admitting: Cardiology

## 2017-10-12 NOTE — Telephone Encounter (Signed)
New message    Patient scheduled for cardiac ct June 19th, 2019 1030am

## 2017-11-07 ENCOUNTER — Encounter (HOSPITAL_COMMUNITY): Payer: Self-pay

## 2017-11-07 ENCOUNTER — Ambulatory Visit (HOSPITAL_COMMUNITY)
Admission: RE | Admit: 2017-11-07 | Discharge: 2017-11-07 | Disposition: A | Discharge status: Home / Self Care | Payer: BC Managed Care – PPO | Source: Ambulatory Visit | Attending: Physician Assistant | Admitting: Physician Assistant

## 2017-11-07 DIAGNOSIS — R0609 Other forms of dyspnea: Secondary | ICD-10-CM | POA: Diagnosis not present

## 2017-11-07 DIAGNOSIS — I2510 Atherosclerotic heart disease of native coronary artery without angina pectoris: Secondary | ICD-10-CM | POA: Diagnosis not present

## 2017-11-07 DIAGNOSIS — R079 Chest pain, unspecified: Secondary | ICD-10-CM | POA: Diagnosis not present

## 2017-11-07 DIAGNOSIS — E782 Mixed hyperlipidemia: Secondary | ICD-10-CM

## 2017-11-07 DIAGNOSIS — I110 Hypertensive heart disease with heart failure: Secondary | ICD-10-CM

## 2017-11-07 DIAGNOSIS — F1010 Alcohol abuse, uncomplicated: Secondary | ICD-10-CM

## 2017-11-07 DIAGNOSIS — I10 Essential (primary) hypertension: Secondary | ICD-10-CM | POA: Diagnosis not present

## 2017-11-07 DIAGNOSIS — I5032 Chronic diastolic (congestive) heart failure: Secondary | ICD-10-CM

## 2017-11-07 DIAGNOSIS — I480 Paroxysmal atrial fibrillation: Secondary | ICD-10-CM

## 2017-11-07 DIAGNOSIS — R0602 Shortness of breath: Secondary | ICD-10-CM

## 2017-11-07 DIAGNOSIS — E785 Hyperlipidemia, unspecified: Secondary | ICD-10-CM | POA: Insufficient documentation

## 2017-11-07 MED ORDER — NITROGLYCERIN 0.4 MG SL SUBL
SUBLINGUAL_TABLET | SUBLINGUAL | Status: AC
  Administered 2017-11-07: 0.8 mg
  Filled 2017-11-07: qty 2

## 2017-11-07 MED ORDER — IOPAMIDOL (ISOVUE-370) INJECTION 76%
INTRAVENOUS | Status: AC
  Administered 2017-11-07: 100 mL
  Filled 2017-11-07: qty 100

## 2017-11-07 MED ORDER — NITROGLYCERIN 0.4 MG SL SUBL
0.8000 mg | SUBLINGUAL_TABLET | SUBLINGUAL | Status: DC | PRN
  Filled 2017-11-07: qty 25

## 2017-11-08 ENCOUNTER — Encounter: Payer: Self-pay | Admitting: *Deleted

## 2017-11-08 ENCOUNTER — Telehealth: Payer: Self-pay | Admitting: Physician Assistant

## 2017-11-08 ENCOUNTER — Telehealth: Payer: Self-pay | Admitting: *Deleted

## 2017-11-08 DIAGNOSIS — I110 Hypertensive heart disease with heart failure: Secondary | ICD-10-CM

## 2017-11-08 DIAGNOSIS — I5032 Chronic diastolic (congestive) heart failure: Secondary | ICD-10-CM

## 2017-11-08 DIAGNOSIS — I25119 Atherosclerotic heart disease of native coronary artery with unspecified angina pectoris: Secondary | ICD-10-CM

## 2017-11-08 DIAGNOSIS — I2510 Atherosclerotic heart disease of native coronary artery without angina pectoris: Secondary | ICD-10-CM

## 2017-11-08 DIAGNOSIS — F1010 Alcohol abuse, uncomplicated: Secondary | ICD-10-CM

## 2017-11-08 DIAGNOSIS — E782 Mixed hyperlipidemia: Secondary | ICD-10-CM

## 2017-11-08 MED ORDER — ROSUVASTATIN CALCIUM 10 MG PO TABS
10.0000 mg | ORAL_TABLET | Freq: Every day | ORAL | 3 refills | Status: DC
Start: 2017-11-08 — End: 2017-11-19

## 2017-11-08 NOTE — Telephone Encounter (Signed)
New Message:      Pt is returning a call for his CT results

## 2017-11-08 NOTE — Telephone Encounter (Signed)
Returned pts call and discussed his CT results. See result note.

## 2017-11-08 NOTE — Telephone Encounter (Signed)
-----   Message from Imogene Burn, [place] sent at 11/08/2017  7:47 AM EDT ----- Anderson Malta can you let patient know his calcium score is 120 which isn't too high but there looks to be some blockage in the LAD that Dr. Meda Coffee sent off for further measurement to see if it's significant. We will call him back after we get the FFR results.Can you keep an eye out for the [facility] and call me later today when it's back? I don't know if it will be sent to me or not? Thanks, Selinda Eon

## 2017-11-08 NOTE — Telephone Encounter (Signed)
Notified the pt that per Dr Meda Coffee, she also reviewed his coronary CT and FFR, and noted he has moderate CAD in the mild LAD with stenosis 50-69, and no intervention needed at this time, but she does recommend that he start taking rosuvastatin 10 mg po daily, and come in for repeat labs in 1 month, to check CMET and lipids.  Scheduled the pts lab appt for 12/10/17.  Pt is aware to come fasting to this lab appt.  Confirmed the pharmacy of choice with the pt. Pt verbalized understanding and agrees with this plan.

## 2017-11-08 NOTE — Telephone Encounter (Signed)
-----   Message from Dorothy Spark, MD sent at 11/08/2017  2:33 PM EDT ----- Moderate CAD in the mid LAD with stenosis 50-69%, no intervention needed but he needs to be on a statin - rosuvastatin 10 mg po daily, lipids and CMP in 1 month

## 2017-11-19 ENCOUNTER — Telehealth: Payer: Self-pay | Admitting: Physician Assistant

## 2017-11-19 MED ORDER — PRAVASTATIN SODIUM 20 MG PO TABS: 20.0000 mg | ORAL_TABLET | Freq: Every evening | ORAL | 1 refills | Status: DC

## 2017-11-19 NOTE — Telephone Encounter (Signed)
New Message   Pt c/o medication issue:  1. Name of Medication: rosuvastatin (CRESTOR) 10 MG tablet  2. How are you currently taking this medication (dosage and times per day)? Take 1 tablet (10 mg total) by mouth daily  3. Are you having a reaction (difficulty breathing--STAT)? yes  4. What is your medication issue? Pt states that he is having muscle and joint pain. Please call

## 2017-11-19 NOTE — Telephone Encounter (Signed)
Carl Humphrey or Carl Humphrey, this pt is having muscle and joint aches with the use of rosuvastatin.  Dr Meda Coffee recently started him on this based on his Coronary CT results from 6/20.  Pt states he is intolerant to this statin.  Could you please help with a different regimen? Thanks!

## 2017-11-19 NOTE — Telephone Encounter (Signed)
Would try pravastatin 20mg  every evening to see if pt tolerates this better than rosuvastatin.

## 2017-11-19 NOTE — Telephone Encounter (Signed)
Notified the pt that per our Pharmacist [facility], she recommends that he stop his rosuvastatin and start pravastatin 20 mg po daily. Updated rosuvastatin in the pts allergies as an intolerance.  Confirmed the pharmacy of choice with the pt.  Informed the pt that I will send in only 30 day supply of pravastatin, to make sure he tolerates this appropriately. Advised the pt to keep all follow-up appts as scheduled.  Pt verbalized understanding and agrees with this plan.

## 2017-11-28 ENCOUNTER — Telehealth: Payer: Self-pay | Admitting: Cardiology

## 2017-11-28 DIAGNOSIS — Z789 Other specified health status: Secondary | ICD-10-CM

## 2017-11-28 DIAGNOSIS — E782 Mixed hyperlipidemia: Secondary | ICD-10-CM

## 2017-11-28 DIAGNOSIS — I25119 Atherosclerotic heart disease of native coronary artery with unspecified angina pectoris: Secondary | ICD-10-CM

## 2017-11-28 NOTE — Telephone Encounter (Signed)
Pt calling and need to speak to nurse concerning changing his Pravasatin to something else. Please call pt

## 2017-11-28 NOTE — Telephone Encounter (Signed)
Pt calling to inform Dr Meda Coffee  that he attempted the switch to Pravastatin from Rosuvastatin (rosuvastatin caused him muscle pain/joint aches), and its causing him the same issues as rosuvastatin did.  Pt reports its causing him muscle aches and joint pains. Informed the pt that I will route this message to Dr Meda Coffee to review and advise on and follow-up with the pt shortly thereafter.  Pt verbalized understanding and agrees with this plan.

## 2017-11-29 DIAGNOSIS — Z789 Other specified health status: Secondary | ICD-10-CM | POA: Insufficient documentation

## 2017-11-29 NOTE — Telephone Encounter (Signed)
Left the pt a message to call back to endorse recommendations per Dr Meda Coffee.

## 2017-11-29 NOTE — Telephone Encounter (Signed)
August 6th 2pm with [facility] pharmacist , Per pt request   jean   Pt made aware of appt date and time by [facility] scheduling.

## 2017-11-29 NOTE — Telephone Encounter (Signed)
He has a Moderate CAD in the mid LAD with stenosis 50-69%, please refer him to pharmacy for [place], thank you

## 2017-11-29 NOTE — Telephone Encounter (Signed)
Spoke with the pt and informed him that per Dr Meda Coffee, he should stop the pravastatin, and we will refer him to our [facility] to see our Pharmacist, for work-up and approval of Repatha.  Informed the pt that given he has a moderate CAD in the mid LAD with stenosis 50-69 %, this referral is indicated.  Informed the pt that I will place the referral in the system and have our [facility] schedulers call the pt back to arrange this new pt appt.  Pt verbalized understanding and agrees with this plan.

## 2017-12-10 ENCOUNTER — Other Ambulatory Visit: Payer: BC Managed Care – PPO

## 2017-12-11 ENCOUNTER — Other Ambulatory Visit: Payer: Self-pay | Admitting: Cardiology

## 2017-12-12 ENCOUNTER — Telehealth: Payer: Self-pay

## 2017-12-12 DIAGNOSIS — I480 Paroxysmal atrial fibrillation: Secondary | ICD-10-CM

## 2017-12-12 NOTE — Telephone Encounter (Signed)
Informed patient of results and verbal understanding expressed.  Instructed patient to STOP POTASSIUM. BMET will be drawn 8/6 (he has an appointment at [facility]). He was grateful for call and agrees with treatment plan.

## 2017-12-12 NOTE — Telephone Encounter (Signed)
-----   Message from Sueanne Margarita, MD sent at 12/12/2017  1:47 PM EDT ----- K+ 5 at PCP office lab draw 11/28/2017 - please have him stop Kdur and repeat BMET in 1 week

## 2017-12-13 ENCOUNTER — Telehealth: Payer: Self-pay | Admitting: Cardiology

## 2017-12-13 NOTE — Telephone Encounter (Signed)
Reiterated to the pt that he is to STOP POTASSIUM and have a repeat BMET on 12/25/17.  Pt verbalized understanding and was able to identify this pill to remove from his regimen, while on the phone.  Pt gracious for all the assistance provided.         Theodoro Parma, RN       12/12/17 2:00 PM  Note    Informed patient of results and verbal understanding expressed.  Instructed patient to STOP POTASSIUM. BMET will be drawn 8/6 (he has an appointment at [facility]). He was grateful for call and agrees with treatment plan.             12/12/17 2:00 PM    Theodoro Parma, RN contacted [place], [place] Sellers, Cristal Ford, RN       12/12/17 1:59 PM  Note    ----- Message from Sueanne Margarita, MD sent at 12/12/2017  1:47 PM EDT ----- K+ 5 at PCP office lab draw 11/28/2017 - please have him stop Kdur and repeat BMET in 1 week

## 2017-12-13 NOTE — Telephone Encounter (Signed)
New Message        Patient forgot to write down the drug he was suppose to stop, pls call and advise.

## 2017-12-24 ENCOUNTER — Ambulatory Visit: Payer: BC Managed Care – PPO

## 2017-12-25 ENCOUNTER — Ambulatory Visit (INDEPENDENT_AMBULATORY_CARE_PROVIDER_SITE_OTHER): Payer: BC Managed Care – PPO | Admitting: Pharmacist

## 2017-12-25 DIAGNOSIS — E782 Mixed hyperlipidemia: Secondary | ICD-10-CM | POA: Diagnosis not present

## 2017-12-25 DIAGNOSIS — I480 Paroxysmal atrial fibrillation: Secondary | ICD-10-CM | POA: Diagnosis not present

## 2017-12-25 NOTE — Patient Instructions (Signed)
Lipid Clinic (pharmacist) Adyen Bifulco/Krist[facility] [phone]  *START paperwork for Repatha/Praluent* *Repeat fasting blood work around 5th injection*   Cholesterol Cholesterol is a fat. Your body needs a small amount of cholesterol. Cholesterol (plaque) may build up in your blood vessels (arteries). That makes you more likely to have a heart attack or stroke. You cannot feel your cholesterol level. Having a blood test is the only way to find out if your level is high. Keep your test results. Work with your doctor to keep your cholesterol at a good level. What do the results mean?  Total cholesterol is how much cholesterol is in your blood.  LDL is bad cholesterol. This is the type that can build up. Try to have low LDL.  HDL is good cholesterol. It cleans your blood vessels and carries LDL away. Try to have high HDL.  Triglycerides are fat that the body can store or burn for energy. What are good levels of cholesterol?  Total cholesterol below 200.  LDL below 100 is good for people who have health risks. LDL below 70 is good for people who have very high risks.  HDL above 40 is good. It is best to have HDL of 60 or higher.  Triglycerides below 150. How can I lower my cholesterol? Diet Follow your diet program as told by your doctor.  Choose fish, white meat chicken, or [place] that is roasted or baked. Try not to eat red meat, fried foods, sausage, or lunch meats.  Eat lots of fresh fruits and vegetables.  Choose whole grains, beans, pasta, potatoes, and cereals.  Choose olive oil, corn oil, or canola oil. Only use small amounts.  Try not to eat butter, mayonnaise, shortening, or palm kernel oils.  Try not to eat foods with trans fats.  Choose low-fat or nonfat dairy foods. ? Drink skim or nonfat milk. ? Eat low-fat or nonfat yogurt and cheeses. ? Try not to drink whole milk or cream. ? Try not to eat ice cream, egg yolks, or full-fat cheeses.  Healthy desserts include  angel food cake, ginger snaps, animal crackers, hard candy, popsicles, and low-fat or nonfat frozen yogurt. Try not to eat pastries, cakes, pies, and cookies.  Exercise Follow your exercise program as told by your doctor.  Be more active. Try gardening, walking, and taking the stairs.  Ask your doctor about ways that you can be more active.  Medicine  Take over-the-counter and prescription medicines only as told by your doctor. This information is not intended to replace advice given to you by your health care provider. Make sure you discuss any questions you have with your health care provider. Document Released: 08/04/2008 Document Revised: 12/08/2015 Document Reviewed: 11/18/2015 Elsevier Interactive Patient Education  [employer].

## 2017-12-25 NOTE — Progress Notes (Signed)
Patient ID: Carl Humphrey                 DOB: 14-Sep-1955                    MRN: [id number]     HPI: Carl Humphrey is a 62 y.o. male patient referred to lipid clinic by Dr Meda Coffee. PMH is significant for Afib, hyperlipidemia, tobacco abuse, EtOH abuse, OSA on CPA, and hypothyroidism. Coronary CT performed on 11/07/2017 shows CAD in the mild LAD with 50-69% stenosis. Patient presents to clinic for potential PCSK9i initiation. Noted intolerance to pravastatin and rosuvastatin the past. Currently compliant with fenofibrate therapy.  Current Medications:  Fenofibrate 160mg  daily  Intolerances:  Pravastatin 20mg  daily - joint pain Rosuvastatin 10mg  daily- joint aches   LDL goal: < 70mg /dL  Diet: no formal diet, eats everything  Exercise: activities of daily living  Family History: family history includes Hypertension in his father and mother; Multiple sclerosis in his sister; Other in his sister and sister.   Social History: 24-25 beers per week, denies tobacco use  Labs: 09/22/17: CHO 175; TG 81; HDL 64; LDL-c 95 (fenofibarte 160mg  daily)  Past Medical History:  Diagnosis Date  . Anxiety   . Atrial fibrillation ([place])    a. Dx 08/13/2015;  b. CHA2DS2VASc = 1.  . ETOH abuse   . GERD (gastroesophageal reflux disease)   . Hypertensive heart disease   . Hypertriglyceridemia   . Hypothyroidism   . Obstructive sleep apnea    a. On CPAP x ~ 10 yrs.    Current Outpatient Medications on File Prior to Visit  Medication Sig Dispense Refill  . ALPRAZolam (XANAX) 0.5 MG tablet Take 0.25-0.5 mg by mouth daily as needed.  0  . aspirin EC 81 MG tablet Take 1 tablet (81 mg total) by mouth daily. 90 tablet 3  . Cholecalciferol (VITAMIN D PO) Take by mouth daily.    . fenofibrate 160 MG tablet Take 160 mg by mouth daily.     . folic acid (FOLVITE) 1 MG tablet Take 1 tablet (1 mg total) by mouth daily.    . furosemide (LASIX) 20 MG tablet Take 1 tablet (20 mg total) by mouth daily. 30 tablet 11  .  levothyroxine (SYNTHROID, LEVOTHROID) 88 MCG tablet Take 88 mcg by mouth daily.  0  . metoprolol tartrate (LOPRESSOR) 50 MG tablet TAKE 1 TABLET BY MOUTH TWICE A DAY 60 tablet 11  . Multiple Vitamins-Minerals (MULTIVITAMIN PO) Take by mouth daily.    . Omega-3 Fatty Acids (FISH OIL) 1000 MG CAPS Take by mouth daily.    [name] oxymetazoline (AFRIN) 0.05 % nasal spray Place 1 spray into both nostrils 2 (two) times daily. 30 mL 0  . thiamine 100 MG tablet Take 1 tablet (100 mg total) by mouth daily.    . valsartan (DIOVAN) 320 MG tablet Take 320 mg by mouth daily.    [name] venlafaxine XR (EFFEXOR-XR) 150 MG 24 hr capsule Take 300 mg by mouth daily.   4  . zolpidem (AMBIEN) 10 MG tablet Take 10 mg by mouth at bedtime.  0   No current facility-administered medications on file prior to visit.     Allergies  Allergen Reactions  . Pravastatin Other (See Comments)    Reports causes muscle aches and joint pain  . Rosuvastatin Other (See Comments)    Pt reports causes muscle/joint pain    Hyperlipidemia LDL significantly elevated for secondary prevention. ASCVD confirmed by  Coronary CT performed on 11/07/2017 showing LAD 50-69% stenosed. Patient intolerant to multiple statins but compliance with fibrate therapy.  Repatha/Praluent indication, MOA, common side effect, administration, storage , prior-authorization process and financial responsibility discussed during counseling.   Will start paperwork for PCSK9i prior authorization. Patient is to work on decreasing beer intake by 25-50%, decrease high fat content foods, and increasing physical activity. Plan to repeat fasting blood work 3 months after initiating Repatha/Praluent therapy.    Holy Battenfield Rodriguez-Guzman PharmD, BCPS, [place] [address] [place] [id number] 12/27/2017 5:19 PM

## 2017-12-26 LAB — BASIC METABOLIC PANEL
BUN/Creatinine Ratio: 20 (ref 10–24)
BUN: 22 mg/dL (ref 8–27)
CO2: 22 mmol/L (ref 20–29)
Calcium: 9.9 mg/dL (ref 8.6–10.2)
Chloride: 99 mmol/L (ref 96–106)
Creatinine, Ser: 1.09 mg/dL (ref 0.76–1.27)
GFR calc Af Amer: 84 mL/min/{1.73_m2} (ref >59)
GFR calc non Af Amer: 73 mL/min/{1.73_m2} (ref >59)
Glucose: 93 mg/dL (ref 65–99)
Potassium: 4.6 mmol/L (ref 3.5–5.2)
Sodium: 140 mmol/L (ref 134–144)

## 2017-12-27 ENCOUNTER — Encounter: Payer: Self-pay | Admitting: Pharmacist

## 2017-12-27 NOTE — Assessment & Plan Note (Signed)
LDL significantly elevated for secondary prevention. ASCVD confirmed by Coronary CT performed on 11/07/2017 showing LAD 50-69% stenosed. Patient intolerant to multiple statins but compliance with fibrate therapy.  Repatha/Praluent indication, MOA, common side effect, administration, storage , prior-authorization process and financial responsibility discussed during counseling.   Will start paperwork for PCSK9i prior authorization. Patient is to work on decreasing beer intake by 25-50%, decrease high fat content foods, and increasing physical activity. Plan to repeat fasting blood work 3 months after initiating Repatha/Praluent therapy.

## 2018-01-02 ENCOUNTER — Telehealth: Payer: Self-pay | Admitting: Pharmacist

## 2018-01-02 MED ORDER — EVOLOCUMAB 140 MG/ML ~~LOC~~ SOAJ: 140.0000 mg | SUBCUTANEOUS | 11 refills | Status: DC

## 2018-01-02 NOTE — Telephone Encounter (Signed)
Repatha rx sent to [facility]. Patient has co-pay available

## 2018-01-15 ENCOUNTER — Telehealth: Payer: Self-pay | Admitting: Cardiology

## 2018-01-15 NOTE — Telephone Encounter (Signed)
Pt states he has not starting using his Repatha pen, and was wondering should he still see Dr Meda Coffee for follow-up this Friday 8/30.  Advised the pt that his prescription for Repatha was sent by [facility] Pharmacist on 8/14, and is confirmed that this is available at his pharmacy, [place] the pt to pick this up today, and start taking this as instructed. Advised the pt to keep his scheduled follow-up with Dr Meda Coffee, for this Friday 8/30.  Pt verbalized understanding and agrees with this plan.

## 2018-01-15 NOTE — Telephone Encounter (Signed)
New Message:    Patient says he have not started taking the [place] yet. He wants to know if he should still come for his appointment on Friday(01-18-18)?

## 2018-01-18 ENCOUNTER — Encounter: Payer: Self-pay | Admitting: Cardiology

## 2018-01-18 ENCOUNTER — Ambulatory Visit (INDEPENDENT_AMBULATORY_CARE_PROVIDER_SITE_OTHER): Payer: BC Managed Care – PPO | Admitting: Cardiology

## 2018-01-18 VITALS — BP 120/82 | HR 70 | Ht 68.0 in | Wt 223.6 lb

## 2018-01-18 DIAGNOSIS — I5032 Chronic diastolic (congestive) heart failure: Secondary | ICD-10-CM

## 2018-01-18 DIAGNOSIS — I110 Hypertensive heart disease with heart failure: Secondary | ICD-10-CM

## 2018-01-18 DIAGNOSIS — E782 Mixed hyperlipidemia: Secondary | ICD-10-CM | POA: Diagnosis not present

## 2018-01-18 DIAGNOSIS — I25119 Atherosclerotic heart disease of native coronary artery with unspecified angina pectoris: Secondary | ICD-10-CM | POA: Diagnosis not present

## 2018-01-18 DIAGNOSIS — I480 Paroxysmal atrial fibrillation: Secondary | ICD-10-CM

## 2018-01-18 NOTE — Patient Instructions (Signed)

## 2018-01-18 NOTE — Progress Notes (Signed)
Cardiology Office Note:    Date:  01/18/2018   ID:  Carl Humphrey, DOB 05-25-1955, MRN [id number]  PCP:  System, Pcp Not In  Cardiologist:  Dr. Ena Dawley   Electrophysiologist:  n/a  Referring MD: Thressa Sheller, MD   Chief complain: Follow-up for paroxysmal A. fib  History of Present Illness:     Carl Humphrey is a 62 y.o. male with a hx of HTN, hypertriglyceridemia, OSA on CPAP, hypothyroidism, anxiety, GERD, tobacco abuse, heavy ETOH use.  He was evaluated by Ignacia Bayley, NP on 08/13/15 for AF with RVR.  He was seen in conjuction with Dr. Ena Dawley.  CHADS2-VASc=1 (HTN).  He was placed on Metoprolol Tartrate for rate control.  He was placed on Apixaban with an eye towards DCCV if her remains in AFib after 3 weeks of uninterrupted anticoagulation.    TSH was normal. Echocardiogram demonstrated normal LV function.   Returns for FU.  He is back in NSR.  He is not aware of any changes in his symptoms.  He is not certain he has had any palpitations.  Denies chest pain, dyspnea, syncope, orthopnea, PND, edema.  Denies any bleeding issues.    10/08/2015 - patient is coming after 6 weeks, he remains in sinus rhythm EKG today, he doesn't feel any palpitations no syncope no dizziness. He is compliant with his medicines and doesn't feel any side effects. He denies any bleeding. No chest pain or shortness of breath.  March 22, 2017, the patient is coming after a year and a half, he has not had any palpitations dizziness or syncope since the last visit.  He has been compliant with his medications and has no side effects.  He states that he has been struggling to lose weight and has noticed that he gets short of breath with mild exertion.  Denies any chest pain.  He has slightly puffy legs but no significant edema no orthopnea proximal nocturnal dyspnea.  His legs are slightly puffy but no significant edema orthopnea or paroxysmal nocturnal dyspnea.  01/18/2018 - 6 months follow up, the  patient is doing well, denies any chest pain or shortness of breath, he has not been able to walk last few weeks as he develop hip pain but that has improved with cortisone shots. He has some left calf pain while walking however his ABIs were normal earlier this year.  He is [facility]   Past Medical History:  Diagnosis Date  . Anxiety   . Atrial fibrillation ([place])    a. Dx 08/13/2015;  b. CHA2DS2VASc = 1.  . ETOH abuse   . GERD (gastroesophageal reflux disease)   . Hypertensive heart disease   . Hypertriglyceridemia   . Hypothyroidism   . Obstructive sleep apnea    a. On CPAP x ~ 10 yrs.    Past Surgical History:  Procedure Laterality Date  . no prior surgery      Current Medications: Outpatient Medications Prior to Visit  Medication Sig Dispense Refill  . ALPRAZolam (XANAX) 0.5 MG tablet Take 0.25-0.5 mg by mouth daily as needed.  0  . aspirin EC 81 MG tablet Take 1 tablet (81 mg total) by mouth daily. 90 tablet 3  . Cholecalciferol (VITAMIN D PO) Take by mouth daily.    . Evolocumab (REPATHA SURECLICK) 127 MG/ML SOAJ Inject 140 mg into the skin every 14 (fourteen) days. 2 pen 11  . fenofibrate 160 MG tablet Take 160 mg by mouth  daily.     . folic acid (FOLVITE) 1 MG tablet Take 1 tablet (1 mg total) by mouth daily.    . furosemide (LASIX) 20 MG tablet Take 1 tablet (20 mg total) by mouth daily. 30 tablet 11  . levothyroxine (SYNTHROID, LEVOTHROID) 88 MCG tablet Take 88 mcg by mouth daily.  0  . metoprolol tartrate (LOPRESSOR) 50 MG tablet TAKE 1 TABLET BY MOUTH TWICE A DAY 60 tablet 11  . Multiple Vitamins-Minerals (MULTIVITAMIN PO) Take by mouth daily.    . Omega-3 Fatty Acids (FISH OIL) 1000 MG CAPS Take by mouth daily.    [name] oxymetazoline (AFRIN) 0.05 % nasal spray Place 1 spray into both nostrils 2 (two) times daily. 30 mL 0  . thiamine 100 MG tablet Take 1 tablet (100 mg total) by mouth daily.    . Turmeric 500 MG CAPS Take 1,000 mg  by mouth. Take( 2) 500 mg capsules by mouth daily for a total of 1000 mg daily.    . valsartan (DIOVAN) 320 MG tablet Take 320 mg by mouth daily.    [name] venlafaxine XR (EFFEXOR-XR) 150 MG 24 hr capsule Take 300 mg by mouth daily.   4  . zolpidem (AMBIEN) 10 MG tablet Take 10 mg by mouth at bedtime.  0   No facility-administered medications prior to visit.      Allergies:   Pravastatin and Rosuvastatin   Social History   Socioeconomic History  . Marital status: Married    Spouse name: Not on file  . Number of children: Not on file  . Years of education: Not on file  . Highest education level: Not on file  Occupational History  . Not on file  Social Needs  . Financial resource strain: Not on file  . Food insecurity:    Worry: Not on file    Inability: Not on file  . Transportation needs:    Medical: Not on file    Non-medical: Not on file  Tobacco Use  . Smoking status: Former Smoker    Last attempt to quit: 09/18/1997    Years since quitting: 20.3  . Smokeless tobacco: Former Systems developer    Quit date: 09/18/1997  Substance and Sexual Activity  . Alcohol use: Yes    Comment: He drinks 8-10 beers/day.  . Drug use: Yes    Types: Marijuana    Comment: uses marijuana 1 x month  . Sexual activity: Not on file  Lifestyle  . Physical activity:    Days per week: Not on file    Minutes per session: Not on file  . Stress: Not on file  Relationships  . Social connections:    Talks on phone: Not on file    Gets together: Not on file    Attends religious service: Not on file    Active member of club or organization: Not on file    Attends meetings of clubs or organizations: Not on file    Relationship status: Not on file  Other Topics Concern  . Not on file  Social History Narrative   Lives in [place] with his wife.  He walks his dog about 2.5 miles/day w/o limitations.  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     Family History:  The patient's family  history includes Hypertension in his father and mother; Multiple sclerosis in his sister; Other in his sister and sister.   ROS:   Please see the history of present illness.  Review of Systems  Constitution: Positive for malaise/fatigue.  Psychiatric/Behavioral: Positive for depression.  All other systems reviewed and are negative.   Physical Exam:    VS:  BP 120/82   Pulse 70   Ht 5\' 8"  (1.727 m)   Wt 223 lb 9.6 oz (101.4 kg)   SpO2 97%   BMI 34.00 kg/m    GEN: Well nourished, well developed, in no acute distress  HEENT: normal  Neck: no JVD, no masses Cardiac: Normal S1/S2,  RRR; no murmurs, rubs, or gallops, no edema  Respiratory:  clear to auscultation bilaterally; no wheezing, rhonchi or rales GI: soft, nontender, nondistended MS: no deformity or atrophy  Skin: warm and dry Neuro: No focal deficits  Psych: Alert and oriented x 3, normal affect  Wt Readings from Last 3 Encounters:  01/18/18 223 lb 9.6 oz (101.4 kg)  10/02/17 218 lb (98.9 kg)  09/23/17 222 lb 14.2 oz (101.1 kg)      Studies/Labs Reviewed:     EKG:  EKG is  ordered today.  This was personally reviewed.  It shows normal sinus rhythm with incomplete right bundle branch block otherwise normal and unchanged from prior  Recent Labs: 09/21/2017: B Natriuretic Peptide 359.7 09/22/2017: ALT 31; TSH 3.719 09/23/2017: Magnesium 2.1 10/02/2017: Hemoglobin 16.7; Platelets 270 12/25/2017: BUN 22; Creatinine, Ser 1.09; Potassium 4.6; Sodium 140   Recent Lipid Panel    Component Value Date/Time   CHOL 175 09/22/2017 0521   TRIG 81 09/22/2017 0521   HDL 64 09/22/2017 0521   CHOLHDL 2.7 09/22/2017 0521   VLDL 16 09/22/2017 0521   LDLCALC 95 09/22/2017 0521    Additional studies/ records that were reviewed today include:   Echocardiogram 08/30/15 EF 60-65%, normal wall motion, mild LAE, no MR    ASSESSMENT:     1. PAF (paroxysmal atrial fibrillation) ([place])   2. 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 Mixed hyperlipidemia   4. Hypertensive heart disease with chronic diastolic congestive heart failure ([place])     PLAN:     In order of problems listed above:  1. PAF - He remains in sinus rhythm, he was not symptomatic with A. fib, CHADS2-VASc=1.  Therefore he does not need long term anticoagulation.  Continue aspirin 81 mg daily.  It appears that he only had one episode.  2.  Non-obstructive CAD - - coronary CTA showed Coronary calcium score of 120. This was 93 percentile for age and sex matched control. Moderate CAD in the mid LAD with stenosis 50-69%. Additional analysis with CT FFR will be submitted.  3. HTN -controlled on current regimen.    4.  Hyperlipidemia, because of newly diagnosed nonobstructive CAD he has been approved for Repatha, however has not been able to obtain it yet, our pharmacist will investigate and help him obtain it.  5. Hypothyroidism - Recent TSH normal.  Followed by primary care physician  6. OSA - Continue CPAP.  7. ETOH - He knows to limit ETOH to reduce risk of AFib.   Medication Adjustments/Labs and Tests Ordered: Current medicines are reviewed at length with the patient today.  Concerns regarding medicines are outlined above.  Medication changes, Labs and Tests ordered today are outlined in the Patient Instructions noted below. Patient Instructions  Medication Instructions:   Your physician recommends that you continue on your current medications as directed. Please refer to the Current Medication list given to you today.  Follow-Up:  Your physician wants you to follow-up in: [place] will receive a reminder letter in the mail two months in advance. If you don't receive a letter, please call our office to schedule the follow-up appointment.        If you need a refill on your cardiac medications before your next appointment, please call your  pharmacy.    Signed, Ena Dawley, MD  01/18/2018 12:43 PM    [address], [place], [place]  [address] Phone: [phone]; Fax: [phone]

## 2018-01-30 ENCOUNTER — Telehealth: Payer: Self-pay | Admitting: Pharmacist

## 2018-01-30 NOTE — Telephone Encounter (Signed)
[facility]atha [facility] by [facility]. Co-pay of $0. [facility] and [facility]

## 2018-03-13 ENCOUNTER — Other Ambulatory Visit: Payer: Self-pay | Admitting: Cardiology

## 2018-04-08 DIAGNOSIS — M25551 Pain in right hip: Secondary | ICD-10-CM | POA: Insufficient documentation

## 2018-05-13 ENCOUNTER — Other Ambulatory Visit: Payer: Self-pay | Admitting: Pharmacist

## 2018-05-13 MED ORDER — EVOLOCUMAB 140 MG/ML ~~LOC~~ SOAJ: 140.0000 mg | SUBCUTANEOUS | 11 refills | Status: DC

## 2018-05-13 NOTE — Telephone Encounter (Signed)
Rx rent to local pharmacy.  Patient informed of change.

## 2018-06-04 ENCOUNTER — Telehealth: Payer: Self-pay

## 2018-06-04 DIAGNOSIS — E785 Hyperlipidemia, unspecified: Secondary | ICD-10-CM

## 2018-06-04 DIAGNOSIS — E781 Pure hyperglyceridemia: Secondary | ICD-10-CM

## 2018-06-04 NOTE — Telephone Encounter (Signed)
Called pt to let them know that they are due for fasting labs ordered and left a pt a msg instructing them to come fasting asap to have those done

## 2018-06-05 ENCOUNTER — Other Ambulatory Visit: Payer: Self-pay

## 2018-06-05 DIAGNOSIS — E785 Hyperlipidemia, unspecified: Secondary | ICD-10-CM

## 2018-06-06 ENCOUNTER — Telehealth: Payer: Self-pay | Admitting: *Deleted

## 2018-06-06 DIAGNOSIS — Z789 Other specified health status: Secondary | ICD-10-CM

## 2018-06-06 DIAGNOSIS — E785 Hyperlipidemia, unspecified: Secondary | ICD-10-CM

## 2018-06-06 DIAGNOSIS — E781 Pure hyperglyceridemia: Secondary | ICD-10-CM

## 2018-06-06 DIAGNOSIS — E782 Mixed hyperlipidemia: Secondary | ICD-10-CM

## 2018-06-06 DIAGNOSIS — I25119 Atherosclerotic heart disease of native coronary artery with unspecified angina pectoris: Secondary | ICD-10-CM

## 2018-06-06 LAB — HEPATIC FUNCTION PANEL
ALT: 18 IU/L (ref 0–44)
AST: 20 IU/L (ref 0–40)
Albumin: 4 g/dL (ref 3.6–4.8)
Alkaline Phosphatase: 54 IU/L (ref 39–117)
Bilirubin Total: 0.4 mg/dL (ref 0.0–1.2)
Bilirubin, Direct: 0.13 mg/dL (ref 0.00–0.40)
Total Protein: 6.4 g/dL (ref 6.0–8.5)

## 2018-06-06 LAB — LIPID PANEL
Chol/HDL Ratio: 7 ratio — ABNORMAL HIGH (ref 0.0–5.0)
Cholesterol, Total: 225 mg/dL — ABNORMAL HIGH (ref 100–199)
HDL: 32 mg/dL — ABNORMAL LOW (ref >39)
Triglycerides: 1375 mg/dL (ref 0–149)

## 2018-06-06 NOTE — Telephone Encounter (Signed)
Dorothy Spark, MD  Nuala Alpha, LPN        N we repeat them with complete fasting?   Previous Messages        Result Notes for Lipid panel   Notes recorded by Nuala Alpha, LPN on 09/05/3843 at 3:64 PM EST Spoke with the pt and informed him of his lab results per Dr Meda Coffee.  Asked the pt if he was being compliant with taking all his meds, and if he was fasting the day of this lab appt.  Pt states he is being compliant with taking all his meds, with no skipped doses. Pt states he takes his injectable every 2 weeks, as advised.  Pt stated he did fast, but he did take his meds that morning with a full V8 juice.  Pt is wondering if the V8 juice caused his numbers to elevated. Endorsed to the pt that I will run this information by Dr Meda Coffee, and follow-up with him thereafter, with further instruction. Pt verbalized understanding and agrees with this plan. ------  Notes recorded by Dorothy Spark, MD on 06/06/2018 at 12:33 PM EST Normal LFTs but severely elevated lipids, was he fasting? Is he taking his meds?

## 2018-06-06 NOTE — Telephone Encounter (Signed)
Spoke back with the pt and informed him that per Dr Meda Coffee, she would like for him to come back in, for fasting lipids. Reiterated and educated the pt on how he should correctly fast for this lab.  Scheduled the pt to come in for repeat lipids on next Wednesday 06/12/18.  Pt verbalized understanding and agrees with this plan.

## 2018-06-06 NOTE — Telephone Encounter (Signed)
-----   Message from Dorothy Spark, MD sent at 06/06/2018  1:39 PM EST ----- N we repeat them with complete fasting? ----- Message ----- From: Nuala Alpha, LPN Sent: 08/23/5246   1:33 PM EST To: Dorothy Spark, MD  Please read below: Pt did fast but took  his meds with a full V8 juice. ----- Message ----- From: Dorothy Spark, MD Sent: 06/06/2018  12:33 PM EST To: Nuala Alpha, LPN  Normal LFTs but severely elevated lipids, was he fasting? Is he taking his meds?

## 2018-06-12 ENCOUNTER — Other Ambulatory Visit: Payer: BC Managed Care – PPO

## 2018-06-12 ENCOUNTER — Telehealth: Payer: Self-pay | Admitting: Pharmacist

## 2018-06-12 ENCOUNTER — Other Ambulatory Visit: Payer: Self-pay

## 2018-06-12 DIAGNOSIS — E785 Hyperlipidemia, unspecified: Secondary | ICD-10-CM

## 2018-06-12 DIAGNOSIS — I25119 Atherosclerotic heart disease of native coronary artery with unspecified angina pectoris: Secondary | ICD-10-CM

## 2018-06-12 LAB — LIPID PANEL
Chol/HDL Ratio: 2.4 ratio (ref 0.0–5.0)
Cholesterol, Total: 142 mg/dL (ref 100–199)
HDL: 60 mg/dL (ref >39)
LDL Calculated: 36 mg/dL (ref 0–99)
Triglycerides: 230 mg/dL — ABNORMAL HIGH (ref 0–149)
VLDL Cholesterol Cal: 46 mg/dL — ABNORMAL HIGH (ref 5–40)

## 2018-06-12 NOTE — Telephone Encounter (Signed)
LDL repeated today - waiting results. Patient still taking Repatha and fenofibrate.    Denies increased alcohol intake or significant changes in diet.   Will continue to f/u and monitor glucose level in addition to LDL and TG.

## 2018-06-13 ENCOUNTER — Telehealth: Payer: Self-pay | Admitting: Cardiology

## 2018-06-13 MED ORDER — EVOLOCUMAB 140 MG/ML ~~LOC~~ SOAJ: 140.0000 mg | SUBCUTANEOUS | 11 refills | Status: DC

## 2018-06-13 NOTE — Telephone Encounter (Signed)
New message     Pt wants to discuss lab results

## 2018-06-13 NOTE — Telephone Encounter (Signed)
Spoke with patient and reviewed lipid labs from yesterday.  Patient noted that [facility].  Will re-submit that today.

## 2018-08-13 ENCOUNTER — Telehealth: Payer: Self-pay | Admitting: Pharmacist

## 2018-08-13 NOTE — Telephone Encounter (Signed)
Patient reports increase joint pain. Wants to know if pain related to Repatha.   Recommendation given:  1. HOLD Repatha for 30 days.   2. Call back if pain resolved to change therapy to Praluent. Otherwise; resume Repatha in 30 days.

## 2018-10-10 ENCOUNTER — Telehealth: Payer: Self-pay | Admitting: *Deleted

## 2018-10-10 NOTE — Telephone Encounter (Signed)
   [facility] Pre-operative Risk Assessment    Request for surgical clearance:  1. What type of surgery is being performed? RIGHT TOTAL HIP  2. When is this surgery scheduled? 11/20/18  3. What type of clearance is required (medical clearance vs. Pharmacy clearance to hold med vs. Both)? MEDICAL  4. Are there any medications that need to be held prior to surgery and how long? ASA   5. Practice name and name of physician performing surgery? [facility]; DR. Wynelle Link  6. What is your office phone number [phone]   7.   What is your office fax number [phone]  8.   Anesthesia type (None, local, MAC, general) ? CHOICE   Carl Humphrey 10/10/2018, 4:45 PM  _________________________________________________________________   (provider comments below)

## 2018-10-11 NOTE — Telephone Encounter (Signed)
   Primary Cardiologist: Ena Dawley, MD  Left voicemail for patient to call back to discuss clearance for upcoming procedure.   Abigail Butts, PA-C 10/11/2018, 9:57 AM

## 2018-10-15 ENCOUNTER — Encounter: Payer: Self-pay | Admitting: *Deleted

## 2018-10-15 NOTE — Telephone Encounter (Signed)
Patient is calling back  °

## 2018-10-15 NOTE — Telephone Encounter (Signed)
Pt has been scheduled for a virtual visit via video with Melina Copa, PA-C. Pt gave verbal consent and also sent via mychart,     TELEPHONE CALL NOTE  This patient has been deemed a candidate for follow-up tele-health visit to limit community exposure during the Covid-19 pandemic. I spoke with the patient via phone to discuss instructions. This has been outlined on the patient's AVS (dotphrase: [email or web address]). The patient was advised to review the section on consent for treatment as well. The patient will receive a phone call 2-3 days prior to their E-Visit at which time consent will be verbally confirmed.   A Virtual Office Visit appointment type has been scheduled for Carl Humphrey with Melina Copa, PA-C, with "VIDEO" or "TELEPHONE" in the appointment notes - patient prefers VIDEO type.  I have either confirmed the patient is active in MyChart or offered to send sign-up link to phone/email via Mychart icon beside patient's photo.YES AND CONSENT [name], [place] 10/15/2018 4:00 PM

## 2018-10-15 NOTE — Telephone Encounter (Signed)
   Primary Cardiologist:Carl Meda Coffee, MD  Chart reviewed as part of pre-operative protocol coverage. Because of Carl Humphrey's past medical history and time since last visit, he/she will require a follow-up visit in order to better assess preoperative cardiovascular risk.  Virtual appt is fine.    Pre-op covering staff: - Please schedule appointment and call patient to inform them. - Please contact requesting surgeon's office via preferred method (i.e, phone, fax) to inform them of need for appointment prior to surgery.  If applicable, this message will also be routed to pharmacy pool and/or primary cardiologist for input on holding anticoagulant/antiplatelet agent as requested below so that this information is available at time of patient's appointment.   Cecilie Kicks, NP  10/15/2018, 1:39 PM

## 2018-10-16 ENCOUNTER — Telehealth (INDEPENDENT_AMBULATORY_CARE_PROVIDER_SITE_OTHER): Payer: BC Managed Care – PPO | Admitting: Physician Assistant

## 2018-10-16 ENCOUNTER — Telehealth: Payer: Self-pay | Admitting: Physician Assistant

## 2018-10-16 ENCOUNTER — Other Ambulatory Visit: Payer: Self-pay

## 2018-10-16 ENCOUNTER — Encounter: Payer: Self-pay | Admitting: Physician Assistant

## 2018-10-16 VITALS — BP 151/80 | HR 76 | Ht 68.0 in | Wt 222.0 lb

## 2018-10-16 DIAGNOSIS — I2510 Atherosclerotic heart disease of native coronary artery without angina pectoris: Secondary | ICD-10-CM

## 2018-10-16 DIAGNOSIS — Z01810 Encounter for preprocedural cardiovascular examination: Secondary | ICD-10-CM | POA: Diagnosis not present

## 2018-10-16 DIAGNOSIS — I5032 Chronic diastolic (congestive) heart failure: Secondary | ICD-10-CM | POA: Diagnosis not present

## 2018-10-16 DIAGNOSIS — I480 Paroxysmal atrial fibrillation: Secondary | ICD-10-CM | POA: Diagnosis not present

## 2018-10-16 MED ORDER — FUROSEMIDE 20 MG PO TABS: 20.0000 mg | ORAL_TABLET | Freq: Every day | ORAL | 11 refills | Status: DC

## 2018-10-16 NOTE — Telephone Encounter (Signed)
Pt has been contacted and made aware that his EKG is stable and that his bp is a little better. Pt advised to get a bp cuff and monitor his bp, 3 hours after taking medications, and sitting in a chair 5 mins before checking and to call in with his readings. Pt verbalized understanding.

## 2018-10-16 NOTE — Patient Instructions (Signed)
Medication Instructions:  Your physician recommends that you continue on your current medications as directed. Please refer to the Current Medication list given to you today.  If you need a refill on your cardiac medications before your next appointment, please call your pharmacy.   Lab work: None ordered  If you have labs (blood work) drawn today and your tests are completely normal, you will receive your results only by: . MyChart Message (if you have MyChart) OR . A paper copy in the mail If you have any lab test that is abnormal or we need to change your treatment, we will call you to review the results.  Testing/Procedures: None ordered  Follow-Up: At [facility], you and your health needs are our priority.  As part of our continuing mission to provide you with exceptional heart care, we have created designated Provider Care Teams.  These Care Teams include your primary Cardiologist (physician) and Advanced Practice Providers (APPs -  Physician Assistants and Nurse Practitioners) who all work together to provide you with the care you need, when you need it. You will need a follow up appointment in 6 months.  Please call our office 2 months in advance to schedule this appointment.  You may see Katarina Nelson, MD or one of the following Advanced Practice Providers on your designated Care Team:   Brittainy Simmons, PA-C Dayna Dunn, PA-C . Michele Lenze, PA-C  Any Other Special Instructions Will Be Listed Below (If Applicable).    

## 2018-10-16 NOTE — Progress Notes (Signed)
Virtual Visit via Telephone Note   This visit type was conducted due to national recommendations for restrictions regarding the COVID-19 Pandemic (e.g. social distancing) in an effort to limit this patient's exposure and mitigate transmission in our community.  Due to his co-morbid illnesses, this patient is at least at moderate risk for complications without adequate follow up.  This format is felt to be most appropriate for this patient at this time.  The patient did not have access to video technology/had technical difficulties with video requiring transitioning to audio format only (telephone).  All issues noted in this document were discussed and addressed.  No physical exam could be performed with this format.  Please refer to the patient's chart for his  consent to telehealth for [facility].   Date:  10/16/2018   ID:  Carl Humphrey, DOB 18-Jul-1955, MRN [id number]  Patient Location: Work (patient reports private room) Provider Location: Home  PCP:  System, Pcp Not In  Cardiologist:  Ena Dawley, MD  Electrophysiologist:  None   Evaluation Performed:  Follow-Up Visit  Chief Complaint:  Pre-op clearance  History of Present Illness:    Carl Humphrey is a 63 y.o. male with PAF, nonobstructive CAD by coronary CTA, chronic diastolic CHF, obesity, HTN, hypertriglyceridemia, OSA on CPAP, hypothyroidism, anxiety, GERD, former tobacco abuse, heavy ETOH use who presents for surgical clearance. He was previously seen in 2017 for atrial fib RVR. At that time, CHADSVASC was felt to be 1. He was placed on metoprolol and apixaban with plan for OP DCCV but converted to NSR on his own. He does not recall being particularly symptomatic with this. It appears he's maintained NSR since that time. Dr. Meda Coffee did a coronary CT 10/2017 which showed 70th percentile calcium score, moderate CAD in the mid LAD 50-69% stenosis, with negative FFR. His CHADSVASC appears to now be 2-3 for CAD/CHF/HTN although he  is not on anticoagulation as this was previously recorded as 1. He is on PCSK9 for CAD/HLD. His last labs 05/2018 showed LDL 36, BUN 22, Cr 1.1 (similar to prior, glucose 128, Na 149, K 4.6, CO2 33, albumin 3.8, A1C 5.7, AST/ALT OK, Hgb 15.4, Hct 46.4. Last echo 09/2017 (during admission for PNA, pulmonary edema requiring diuresis, pleural effusion) showed EF 50%, grade 2 DD, mild MR, mild LAE/RAE.  He is seen virtually today for pre-op clearance for total hip arthroplasty. He still considers himself quite active but has been limited by hip pain. He has not had any angina or dyspnea with exertion. No palpitations, syncope, lightheadedness. He drinks around 4-5 beers per day. The patient does not have symptoms concerning for COVID-19 infection (fever, chills, cough, or new shortness of breath).    Past Medical History:  Diagnosis Date  . Anxiety   . CAD (coronary artery disease)    a. nonobstructive by CT 2019.  [name] Chronic diastolic CHF (congestive heart failure) ([place])   . ETOH abuse   . Former tobacco use   . GERD (gastroesophageal reflux disease)   . Hypertensive heart disease   . Hypertriglyceridemia   . Hypothyroidism   . Obesity   . Obstructive sleep apnea    a. On CPAP x ~ 10 yrs.  [name] PAF (paroxysmal atrial fibrillation) ([place])    a. Dx 08/13/2015, without recurrence.   Past Surgical History:  Procedure Laterality Date  . no prior surgery       Current Meds  Medication Sig  . ALPRAZolam (XANAX) 0.5 MG tablet Take 0.25-0.5 mg  by mouth daily as needed.  [name] aspirin EC 81 MG tablet Take 1 tablet (81 mg total) by mouth daily.  . Cholecalciferol (VITAMIN D PO) Take by mouth daily.  . Evolocumab (REPATHA SURECLICK) 250 MG/ML SOAJ Inject 140 mg into the skin every 14 (fourteen) days.  . fenofibrate 160 MG tablet Take 160 mg by mouth daily.   . folic acid (FOLVITE) 1 MG tablet Take 1 tablet (1 mg total) by mouth daily.  . furosemide (LASIX) 20 MG tablet Take 1 tablet (20 mg total) by mouth  daily.  [name] levothyroxine (SYNTHROID, LEVOTHROID) 88 MCG tablet Take 88 mcg by mouth daily.  . metoprolol tartrate (LOPRESSOR) 50 MG tablet TAKE 1 TABLET BY MOUTH TWICE A DAY  . Multiple Vitamins-Minerals (MULTIVITAMIN PO) Take by mouth daily.  . Omega-3 Fatty Acids (FISH OIL) 1000 MG CAPS Take by mouth daily.  [name] oxymetazoline (AFRIN) 0.05 % nasal spray Place 1 spray into both nostrils 2 (two) times daily.  [name] thiamine 100 MG tablet Take 1 tablet (100 mg total) by mouth daily.  . Turmeric 500 MG CAPS Take 1,000 mg by mouth. Take( 2) 500 mg capsules by mouth daily for a total of 1000 mg daily.  . valsartan (DIOVAN) 320 MG tablet Take 320 mg by mouth daily.  [name] venlafaxine XR (EFFEXOR-XR) 150 MG 24 hr capsule Take 300 mg by mouth daily.   [name] zolpidem (AMBIEN) 10 MG tablet Take 10 mg by mouth at bedtime.     Allergies:   Pravastatin and Rosuvastatin   Social History   Tobacco Use  . Smoking status: Former Smoker    Last attempt to quit: 09/18/1997    Years since quitting: 21.0  . Smokeless tobacco: Former Systems developer    Quit date: 09/18/1997  Substance Use Topics  . Alcohol use: Yes    Comment: He drinks 8-10 beers/day.  . Drug use: Yes    Types: Marijuana    Comment: uses marijuana 1 x month     Family Hx: The patient's family history includes Hypertension in his father and mother; Multiple sclerosis in his sister; Other in his sister and sister. There is no history of Colon cancer, Heart attack, or Stroke.  ROS:   Please see the history of present illness.    All other systems reviewed and are negative.   Prior CV studies:   Most recent pertinent cardiac studies are outlined above.   Labs/Other Tests and Data Reviewed:    EKG:  An ECG dated 10/02/17 was personally reviewed today and demonstrated:  showed sinus bradycardia, possible LAE, IRBBB  Recent Labs: 12/25/2017: BUN 22; Creatinine, Ser 1.09; Potassium 4.6; Sodium 140 06/05/2018: ALT 18   Recent Lipid Panel Lab Results   Component Value Date/Time   CHOL 142 06/12/2018 11:20 AM   TRIG 230 (H) 06/12/2018 11:20 AM   HDL 60 06/12/2018 11:20 AM   CHOLHDL 2.4 06/12/2018 11:20 AM   CHOLHDL 2.7 09/22/2017 05:21 AM   LDLCALC 36 06/12/2018 11:20 AM    Wt Readings from Last 3 Encounters:  10/16/18 222 lb (100.7 kg)  01/18/18 223 lb 9.6 oz (101.4 kg)  10/02/17 218 lb (98.9 kg)     Objective:    Vital Signs:  BP (!) 151/80   Pulse 76   Ht 5\' 8"  (1.727 m)   Wt 222 lb (100.7 kg)   BMI 33.75 kg/m    VITAL SIGNS:  reviewed General - obese male in no acute distress HEENT - NCAT, EOM  intact Pulm - No labored breathing, no coughing during visit, no audible wheezing, speaking in full sentences Neuro - A+Ox3, no slurred speech, answers questions appropriately Psych - Pleasant affect    ASSESSMENT & PLAN:    1. Pre-op cardiovascular clearance - revised cardiac risk index is 0.9% indicating low risk of CV complications from surgery. He is not describing any new concerning cardiac symptoms. Therefore, based on ACC/AHA guidelines, Carl Humphrey would be at acceptable risk for the planned procedure without further cardiovascular testing. He does not have prior history of PCI so if needed, would be OK to hold aspirin for 5-7 days prior to procedure and resume as soon as felt safe. Since it has been over a year since his last EKG I would like him to get one prior to finalizing clearance. We will arrange. He has been screened for Covid via this telephone call and is aware to wear his own mask. Addendum 2:10 PM: EKG stable. OK to proceed with surgery from cardiac standpoint. I will cc copy of this note to requested fax number from 5/21 clearance note.  2. Nonobstructive CAD - no recent anginal symptoms. Continue ASA and lipid therapy. BP is running a little higher today. He has been out of his furosemide recently. Will resume. Will have his BP checked when he comes into the office for his EKG today. He inquires about when his  next lipid testing will be due. I will have my covering staff reach out to pharmD to review and contact patient with instructions. Addendum: per discussion with in-office team this afternoon, BP recheck in office is 138/82. EKG personally reviewed stable, shows NSR 61bpm with IRBBB, nonspecific inferior TW changes similar to prior. Will ask RMA to call patient to inform him of EKG review and also ask him to monitor BP with resumption of Lasix and call if runnning >361 systolic or >44 diastolic at which time it would be reasonable to consider switching metoprolol to carvedilol. 3. Paroxysmal atrial fib - one episode noted 2017, CHADSVASC is 3 due to HTN, CAD, and h/o diastolic CHF. He is not on anticoagulation due to [facility] previously being documented as 1 prior to the updates in 2019. Await in-office EKG. I plan to reach out to Dr. Meda Coffee for input on this. He drinks 4-5 beers per day so anticoagulation is less ideal unless we see clear cut recurrence of atrial fib. He was made aware that ETOH can increase his risk of recurrence. Discussed periodically checking HR and regularity via manual pulse checks or devices such as Apple Watch. 4. Chronic diastolic CHF - he reports mild intermittent sockline edema but no significant swelling. Weight is stable. Resume Lasix 20mg  daily. He was made aware to reduce ETOH intake.  COVID-19 Education: The signs and symptoms of COVID-19 were discussed with the patient and how to seek care for testing (follow up with PCP or arrange E-visit).  The importance of social distancing was discussed today.  Time:   Today, I have spent 20 minutes with the patient with telehealth technology discussing the above problems.     Medication Adjustments/Labs and Tests Ordered: Current medicines are reviewed at length with the patient today.  Concerns regarding medicines are outlined above.   Disposition:  Follow up 6 months with Dr. Meda Coffee  Signed, Charlie Pitter, PA-C  10/16/2018  11:32 AM    [place]

## 2018-10-16 NOTE — Telephone Encounter (Signed)
    Routing this message to Dr. Meda Coffee for input [place], I saw this patient of yours today virtually in clinic. He had an episode of incidentally discovered atrial fib RVR back in 2017 at which time CHADSVASC was 1 for HTN, therefore he was not put on longterm anticoagulation. It looks like the [place] of 1 has been carried forward in his notes but since that time, has been diagnosed with CAD (moderate LAD by coronary CT) and diastolic CHF, raising his CHADSVASC to 2-3.   However, by EKGs, he has maintained NSR since that time. He has not had any palpitations but was also unaware of AF back in 2017. Does his interim increase in CHADSVASC change your thought with regard to chronic anticoagulation? He is only on ASA. He does drink moderately heavy alcohol so may not be the best candidate but just wanted to get your input. I saw him and cleared him for hip surgery today. EKG showed NSR. Thanks!   Also, Anderson [place] - can you call Mr. Debruin and let him know his EKG was stable? His BP was a little better by recheck (138/82) but still slightly higher than goal given his other history. Please ask him to follow for a few days with resumption of his Lasix. I would typically advise these instructions: "Please get a blood pressure cuff that goes on your arm. The wrist ones can be inaccurate. If possible, try to select one that also reports your heart rate. To check your blood pressure, choose a time about 3 hours after taking your blood pressure medicines. Remain seated in a chair for 5 minutes quietly beforehand, then check it. When you get a cuff, please keep a log of a few days of readings and send in those readings for review." He can send them into MyChart (and just include my name on the message) or call us. Jahrel Borthwick PA-C

## 2018-10-22 ENCOUNTER — Encounter: Payer: Self-pay | Admitting: *Deleted

## 2018-10-22 ENCOUNTER — Telehealth: Payer: Self-pay | Admitting: Cardiology

## 2018-10-22 DIAGNOSIS — I480 Paroxysmal atrial fibrillation: Secondary | ICD-10-CM

## 2018-10-22 NOTE — Telephone Encounter (Signed)
  Patient was asked to get a fitbit to check his heart rate. He wants to know when and how often he needs to check it. And when he will need to report back with the results.

## 2018-10-22 NOTE — Telephone Encounter (Signed)
Called pt back re: checking heart rate.  I left pt a detailed message on voicemail and also send him a mychart message. Asked pt if he didn't receive the myhcart message, to call me back.

## 2018-10-22 NOTE — Telephone Encounter (Signed)
Thank you for the update! Please let patient know there are different models of Fitbits available on the market. Some of these may be tracking his heart rate (HR) around the clock. Some of them give an average reading of HR. Some of them can notify him in real-time when his HR is unexpectedly going up. To make sure he's reaping the benefits of his new device he should definitely read the manual.   I think checking his resting HR once a day is a good place to start. To give him an idea of his "normal," by our EKGs when he is sitting/resting, his baseline HR appears to be in the 50s-70s.   By comparison when he was in atrial fib in the past, he was in the 120s. So going forward if he notices his HR is elevated beyond his usual trend (and he's not sick, exercising or dehydrated), he should call us to let us know. I would say 90 and above (while resting) would be an indication to call us to discuss doing an EKG. It's important to remember that HR will go up with activity so the resting HR is one of the best ways to assess HR variation, rather than exercise HR - at least from an atrial fib surveillance standpoint.  I reached out to Dr. Meda Coffee last week about whether his risk factors for stroke/afib meant he would require a blood thinner in the future. Will let you know when I hear back. Dayna Dunn PA-C

## 2018-10-24 ENCOUNTER — Encounter: Payer: Self-pay | Admitting: *Deleted

## 2018-10-24 NOTE — Telephone Encounter (Signed)
Call placed to pt re: phone note below.  Left pt a message to call the office back.  I will also send pt a mychart message.

## 2018-10-24 NOTE — Telephone Encounter (Signed)
Hi Carl Humphrey, I heard back from Dr. Meda Coffee about this patient as I had a question about his atrial fib and the possibility that he could have it and we don't know - which would require blood thinner since his CHADSVASC score is now 3. Per Dr. Meda Coffee, "I would let him wear 2 week zio patch and if no a-fib continue ASA only but if a-fib then switch to NOAC. " So can you please arrange 2 week Zio patch and let pt know? I also see you sent him message on info below but do not see that he reviewed yet.  Ramez Arrona PA-C

## 2018-10-25 NOTE — Telephone Encounter (Signed)
Pt received below message via mychart.

## 2018-10-30 DIAGNOSIS — M1611 Unilateral primary osteoarthritis, right hip: Secondary | ICD-10-CM | POA: Insufficient documentation

## 2018-11-07 ENCOUNTER — Telehealth: Payer: Self-pay | Admitting: *Deleted

## 2018-11-07 NOTE — Telephone Encounter (Signed)
14 day ZIO XT long term holter monitor to be mailed to patients home.  Instructions reviewed briefly as they are included in the monitor kit.

## 2018-11-11 NOTE — Telephone Encounter (Signed)
Follow up ° ° °Patient is returning call. Please call. °

## 2018-11-12 NOTE — Telephone Encounter (Signed)
Patient is returning your call. Please call

## 2018-11-12 NOTE — Patient Instructions (Addendum)
Carl Humphrey  11/12/2018   Your procedure is scheduled on: Wednesday 11/20/2018  Report to [facility] Main  Entrance              Report to admitting at   1100 AM              [place] [facility] TEST ON_______ @_______ , THIS TEST MUST BE DONE BEFORE SURGERY, COME TO [place].             ONCE YOUR COVID TEST IS COMPLETED, PLEASE BEGIN THE QUARANTINE INSTRUCTIONS AS OUTLINED IN YOUR HANDOUT.   Call this number if you have problems the morning of surgery [phone]                Please bring CPAP mask and tubing with you to the hospital!   Remember: Do not eat food :After Midnight.               NO SOLID FOOD AFTER MIDNIGHT THE NIGHT PRIOR TO SURGERY. NOTHING BY MOUTH EXCEPT CLEAR LIQUIDS UNTIL 3 HOURS PRIOR TO [place] SURGERY.              PLEASE FINISH ENSURE DRINK PER SURGEON ORDER 3 HOURS PRIOR TO SCHEDULED SURGERY TIME WHICH NEEDS TO BE COMPLETED AT   1030 am.   CLEAR LIQUID DIET   Foods Allowed                                                                     Foods Excluded  Coffee and tea, regular and decaf                             liquids that you cannot  Plain Jell-O in any flavor                                             see through such as: Fruit ices (not with fruit pulp)                                     milk, soups, orange juice  Iced Popsicles                                    All solid food Carbonated beverages, regular and diet                                    Cranberry, grape and apple juices Sports drinks like Gatorade Lightly seasoned clear broth or consume(fat free) Sugar, honey syrup  Sample Menu Breakfast                                Lunch  Supper Cranberry juice                    Beef broth                            Chicken broth Jell-O                                     Grape juice                           Apple  juice Coffee or tea                        Jell-O                                      Popsicle                                                Coffee or tea                        Coffee or tea  _____________________________________________________________________               BRUSH YOUR TEETH MORNING OF SURGERY AND RINSE YOUR MOUTH OUT, NO CHEWING GUM CANDY OR MINTS.     Take these medicines the morning of surgery with A SIP OF WATER: Levothyroxine (Synthroid), Metoprolol Tartrate (Lopressor), Venlafaxine (Effexor XR)                                  You may not have any metal on your body including hair pins and              piercings  Do not wear jewelry, , lotions, powders or perfumes, deodorant                      Men may shave face and neck.   Do not bring valuables to the hospital. [place].  Contacts, dentures or bridgework may not be worn into surgery.  Leave suitcase in the car. After surgery it may be brought to your room.                  Please read over the following fact sheets you were given: _____________________________________________________________________             [facility] - Preparing for Surgery Before surgery, you can play an important role.  Because skin is not sterile, your skin needs to be as free of germs as possible.  You can reduce the number of germs on your skin by washing with CHG (chlorahexidine gluconate) soap before surgery.  CHG is an antiseptic cleaner which kills germs and bonds with the skin to continue killing germs even after washing. Please DO NOT use if you have an allergy to CHG or antibacterial soaps.  If your skin becomes reddened/irritated stop using the CHG and inform your nurse when you arrive at Short Stay. Do not shave (including legs and underarms) for at least 48 hours prior to the first CHG shower.  You may shave your face/neck. Please follow these instructions  carefully:  1.  Shower with CHG Soap the night before surgery and the  morning of Surgery.  2.  If you choose to wash your hair, wash your hair first as usual with your  normal  shampoo.  3.  After you shampoo, rinse your hair and body thoroughly to remove the  shampoo.                           4.  Use CHG as you would any other liquid soap.  You can apply chg directly  to the skin and wash                       Gently with a scrungie or clean washcloth.  5.  Apply the CHG Soap to your body ONLY FROM THE NECK DOWN.   Do not use on face/ open                           Wound or open sores. Avoid contact with eyes, ears mouth and genitals (private parts).                       Wash face,  Genitals (private parts) with your normal soap.             6.  Wash thoroughly, paying special attention to the area where your surgery  will be performed.  7.  Thoroughly rinse your body with warm water from the neck down.  8.  DO NOT shower/wash with your normal soap after using and rinsing off  the CHG Soap.                9.  Pat yourself dry with a clean towel.            10.  Wear clean pajamas.            11.  Place clean sheets on your bed the night of your first shower and do not  sleep with pets. Day of Surgery : Do not apply any lotions/deodorants the morning of surgery.  Please wear clean clothes to the hospital/surgery center.  FAILURE TO FOLLOW THESE INSTRUCTIONS MAY RESULT IN THE CANCELLATION OF YOUR SURGERY PATIENT SIGNATURE_________________________________  NURSE SIGNATURE__________________________________  ________________________________________________________________________   Carl Humphrey  An incentive spirometer is a tool that can help keep your lungs clear and active. This tool measures how well you are filling your lungs with each breath. Taking long deep breaths may help reverse or decrease the chance of developing breathing (pulmonary) problems (especially infection)  following:  A long period of time when you are unable to move or be active. BEFORE THE PROCEDURE   If the spirometer includes an indicator to show your best effort, your nurse or respiratory therapist will set it to a desired goal.  If possible, sit up straight or lean slightly forward. Try not to slouch.  Hold the incentive spirometer in an upright position. INSTRUCTIONS FOR USE  1. Sit on the edge of your bed if possible, or sit up as far as you can in bed or on  a chair. 2. Hold the incentive spirometer in an upright position. 3. Breathe out normally. 4. Place the mouthpiece in your mouth and seal your lips tightly around it. 5. Breathe in slowly and as deeply as possible, raising the piston or the ball toward the top of the column. 6. Hold your breath for 3-5 seconds or for as long as possible. Allow the piston or ball to fall to the bottom of the column. 7. Remove the mouthpiece from your mouth and breathe out normally. 8. Rest for a few seconds and repeat Steps 1 through 7 at least 10 times every 1-2 hours when you are awake. Take your time and take a few normal breaths between deep breaths. 9. The spirometer may include an indicator to show your best effort. Use the indicator as a goal to work toward during each repetition. 10. After each set of 10 deep breaths, practice coughing to be sure your lungs are clear. If you have an incision (the cut made at the time of surgery), support your incision when coughing by placing a pillow or rolled up towels firmly against it. Once you are able to get out of bed, walk around indoors and cough well. You may stop using the incentive spirometer when instructed by your caregiver.  RISKS AND COMPLICATIONS  Take your time so you do not get dizzy or light-headed.  If you are in pain, you may need to take or ask for pain medication before doing incentive spirometry. It is harder to take a deep breath if you are having pain. AFTER USE  Rest and  breathe slowly and easily.  It can be helpful to keep track of a log of your progress. Your caregiver can provide you with a simple table to help with this. If you are using the spirometer at home, follow these instructions: [place] IF:   You are having difficultly using the spirometer.  You have trouble using the spirometer as often as instructed.  Your pain medication is not giving enough relief while using the spirometer.  You develop fever of 100.5 F (38.1 C) or higher. SEEK IMMEDIATE MEDICAL CARE IF:   You cough up bloody sputum that had not been present before.  You develop fever of 102 F (38.9 C) or greater.  You develop worsening pain at or near the incision site. MAKE SURE YOU:   Understand these instructions.  Will watch your condition.  Will get help right away if you are not doing well or get worse. Document Released: 09/18/2006 Document Revised: 07/31/2011 Document Reviewed: 11/19/2006 Exit[employer] Patient Information 2014 Exit[employer], [place].   ________________________________________________________________________  WHAT IS A BLOOD TRANSFUSION? Blood Transfusion Information  A transfusion is the replacement of blood or some of its parts. Blood is made up of multiple cells which provide different functions.  Red blood cells carry oxygen and are used for blood loss replacement.  White blood cells fight against infection.  Platelets control bleeding.  Plasma helps clot blood.  Other blood products are available for specialized needs, such as hemophilia or other clotting disorders. BEFORE THE TRANSFUSION  Who gives blood for transfusions?   Healthy volunteers who are fully evaluated to make sure their blood is safe. This is blood bank blood. Transfusion therapy is the safest it has ever been in the practice of medicine. Before blood is taken from a donor, a complete history is taken to make sure that person has no history of diseases nor engages in  risky social behavior (  examples are intravenous drug use or sexual activity with multiple partners). The donor's travel history is screened to minimize risk of transmitting infections, such as malaria. The donated blood is tested for signs of infectious diseases, such as HIV and hepatitis. The blood is then tested to be sure it is compatible with you in order to minimize the chance of a transfusion reaction. If you or a relative donates blood, this is often done in anticipation of surgery and is not appropriate for emergency situations. It takes many days to process the donated blood. RISKS AND COMPLICATIONS Although transfusion therapy is very safe and saves many lives, the main dangers of transfusion include:   Getting an infectious disease.  Developing a transfusion reaction. This is an allergic reaction to something in the blood you were given. Every precaution is taken to prevent this. The decision to have a blood transfusion has been considered carefully by your caregiver before blood is given. Blood is not given unless the benefits outweigh the risks. AFTER THE TRANSFUSION  Right after receiving a blood transfusion, you will usually feel much better and more energetic. This is especially true if your red blood cells have gotten low (anemic). The transfusion raises the level of the red blood cells which carry oxygen, and this usually causes an energy increase.  The nurse administering the transfusion will monitor you carefully for complications. HOME CARE INSTRUCTIONS  No special instructions are needed after a transfusion. You may find your energy is better. Speak with your caregiver about any limitations on activity for underlying diseases you may have. SEEK MEDICAL CARE IF:   Your condition is not improving after your transfusion.  You develop redness or irritation at the intravenous (IV) site. SEEK IMMEDIATE MEDICAL CARE IF:  Any of the following symptoms occur over the next 12  hours:  Shaking chills.  You have a temperature by mouth above 102 F (38.9 C), not controlled by medicine.  Chest, back, or muscle pain.  People around you feel you are not acting correctly or are confused.  Shortness of breath or difficulty breathing.  Dizziness and fainting.  You get a rash or develop hives.  You have a decrease in urine output.  Your urine turns a dark color or changes to pink, red, or brown. Any of the following symptoms occur over the next 10 days:  You have a temperature by mouth above 102 F (38.9 C), not controlled by medicine.  Shortness of breath.  Weakness after normal activity.  The white part of the eye turns yellow (jaundice).  You have a decrease in the amount of urine or are urinating less often.  Your urine turns a dark color or changes to pink, red, or brown. Document Released: 05/05/2000 Document Revised: 07/31/2011 Document Reviewed: 12/23/2007 [facility] Patient Information 2014 [place], [place].  _______________________________________________________________________

## 2018-11-13 ENCOUNTER — Encounter (HOSPITAL_COMMUNITY)
Admission: RE | Admit: 2018-11-13 | Discharge: 2018-11-13 | Disposition: A | Discharge status: Home / Self Care | Payer: BC Managed Care – PPO | Source: Ambulatory Visit | Attending: Orthopedic Surgery | Admitting: Orthopedic Surgery

## 2018-11-13 ENCOUNTER — Encounter (HOSPITAL_COMMUNITY): Payer: Self-pay

## 2018-11-13 ENCOUNTER — Other Ambulatory Visit: Payer: Self-pay

## 2018-11-13 DIAGNOSIS — G4733 Obstructive sleep apnea (adult) (pediatric): Secondary | ICD-10-CM | POA: Insufficient documentation

## 2018-11-13 DIAGNOSIS — Z01812 Encounter for preprocedural laboratory examination: Secondary | ICD-10-CM | POA: Diagnosis present

## 2018-11-13 DIAGNOSIS — Z79890 Hormone replacement therapy (postmenopausal): Secondary | ICD-10-CM | POA: Insufficient documentation

## 2018-11-13 DIAGNOSIS — I2510 Atherosclerotic heart disease of native coronary artery without angina pectoris: Secondary | ICD-10-CM | POA: Insufficient documentation

## 2018-11-13 DIAGNOSIS — Z87891 Personal history of nicotine dependence: Secondary | ICD-10-CM | POA: Diagnosis not present

## 2018-11-13 DIAGNOSIS — Z7982 Long term (current) use of aspirin: Secondary | ICD-10-CM | POA: Diagnosis not present

## 2018-11-13 DIAGNOSIS — F419 Anxiety disorder, unspecified: Secondary | ICD-10-CM | POA: Diagnosis not present

## 2018-11-13 DIAGNOSIS — E781 Pure hyperglyceridemia: Secondary | ICD-10-CM | POA: Diagnosis not present

## 2018-11-13 DIAGNOSIS — M1990 Unspecified osteoarthritis, unspecified site: Secondary | ICD-10-CM | POA: Diagnosis not present

## 2018-11-13 DIAGNOSIS — I110 Hypertensive heart disease with heart failure: Secondary | ICD-10-CM | POA: Insufficient documentation

## 2018-11-13 DIAGNOSIS — Z1159 Encounter for screening for other viral diseases: Secondary | ICD-10-CM | POA: Insufficient documentation

## 2018-11-13 DIAGNOSIS — I480 Paroxysmal atrial fibrillation: Secondary | ICD-10-CM | POA: Diagnosis not present

## 2018-11-13 DIAGNOSIS — I5032 Chronic diastolic (congestive) heart failure: Secondary | ICD-10-CM | POA: Insufficient documentation

## 2018-11-13 DIAGNOSIS — I4510 Unspecified right bundle-branch block: Secondary | ICD-10-CM | POA: Diagnosis not present

## 2018-11-13 DIAGNOSIS — Z79899 Other long term (current) drug therapy: Secondary | ICD-10-CM | POA: Diagnosis not present

## 2018-11-13 DIAGNOSIS — E669 Obesity, unspecified: Secondary | ICD-10-CM | POA: Diagnosis not present

## 2018-11-13 DIAGNOSIS — Z6836 Body mass index (BMI) 36.0-36.9, adult: Secondary | ICD-10-CM | POA: Insufficient documentation

## 2018-11-13 DIAGNOSIS — E039 Hypothyroidism, unspecified: Secondary | ICD-10-CM | POA: Diagnosis not present

## 2018-11-13 DIAGNOSIS — M87059 Idiopathic aseptic necrosis of unspecified femur: Secondary | ICD-10-CM | POA: Diagnosis not present

## 2018-11-13 HISTORY — DX: Pneumonia, unspecified organism: J18.9

## 2018-11-13 HISTORY — DX: Cardiac arrhythmia, unspecified: I49.9

## 2018-11-13 HISTORY — DX: Unspecified osteoarthritis, unspecified site: M19.90

## 2018-11-13 LAB — COMPREHENSIVE METABOLIC PANEL
ALT: 22 U/L (ref 0–44)
AST: 33 U/L (ref 15–41)
Albumin: 3.9 g/dL (ref 3.5–5.0)
Alkaline Phosphatase: 42 U/L (ref 38–126)
Anion gap: 9 (ref 5–15)
BUN: 21 mg/dL (ref 8–23)
CO2: 27 mmol/L (ref 22–32)
Calcium: 9.1 mg/dL (ref 8.9–10.3)
Chloride: 105 mmol/L (ref 98–111)
Creatinine, Ser: 0.97 mg/dL (ref 0.61–1.24)
GFR calc Af Amer: >60 mL/min (ref >60)
GFR calc non Af Amer: >60 mL/min (ref >60)
Glucose, Bld: 107 mg/dL — ABNORMAL HIGH (ref 70–99)
Potassium: 4.4 mmol/L (ref 3.5–5.1)
Sodium: 141 mmol/L (ref 135–145)
Total Bilirubin: 0.5 mg/dL (ref 0.3–1.2)
Total Protein: 6.7 g/dL (ref 6.5–8.1)

## 2018-11-13 LAB — CBC
HCT: 47.2 % (ref 39.0–52.0)
Hemoglobin: 15.4 g/dL (ref 13.0–17.0)
MCH: 29.5 pg (ref 26.0–34.0)
MCHC: 32.6 g/dL (ref 30.0–36.0)
MCV: 90.4 fL (ref 80.0–100.0)
Platelets: 231 10*3/uL (ref 150–400)
RBC: 5.22 MIL/uL (ref 4.22–5.81)
RDW: 13.7 % (ref 11.5–15.5)
WBC: 10.7 10*3/uL — ABNORMAL HIGH (ref 4.0–10.5)
nRBC: 0 % (ref 0.0–0.2)

## 2018-11-13 LAB — APTT: aPTT: 31 seconds (ref 24–36)

## 2018-11-13 LAB — PROTIME-INR
INR: 1.2 (ref 0.8–1.2)
Prothrombin Time: 14.8 seconds (ref 11.4–15.2)

## 2018-11-13 LAB — SURGICAL PCR SCREEN
MRSA, PCR: NEGATIVE
Staphylococcus aureus: NEGATIVE

## 2018-11-13 NOTE — Progress Notes (Signed)
ekg 10-16-18 epic  Echo 09-23-17 epic

## 2018-11-14 LAB — ABO/RH: ABO/RH(D): A NEG

## 2018-11-16 ENCOUNTER — Other Ambulatory Visit (HOSPITAL_COMMUNITY)
Admission: RE | Admit: 2018-11-16 | Discharge: 2018-11-16 | Disposition: A | Discharge status: Home / Self Care | Payer: BC Managed Care – PPO | Source: Ambulatory Visit | Attending: Orthopedic Surgery | Admitting: Orthopedic Surgery

## 2018-11-16 DIAGNOSIS — Z1159 Encounter for screening for other viral diseases: Secondary | ICD-10-CM | POA: Diagnosis present

## 2018-11-16 LAB — SARS CORONAVIRUS 2 (TAT 6-24 HRS): SARS Coronavirus 2: NEGATIVE

## 2018-11-18 NOTE — Anesthesia Preprocedure Evaluation (Addendum)
Anesthesia Evaluation  Patient identified by MRN, date of birth, ID band Patient awake    Reviewed: Allergy & Precautions, NPO status , Patient's Chart, lab work & pertinent test results  History of Anesthesia Complications Negative for: history of anesthetic complications  Airway Mallampati: III  TM Distance: <3 FB Neck ROM: Full    Dental  (+) Teeth Intact   Pulmonary sleep apnea and Continuous Positive Airway Pressure Ventilation , former smoker,    Pulmonary exam normal        Cardiovascular + CAD, + Peripheral Vascular Disease and +CHF  Normal cardiovascular exam+ dysrhythmias Atrial Fibrillation      Neuro/Psych PSYCHIATRIC DISORDERS Anxiety negative neurological ROS     GI/Hepatic GERD  ,(+)     substance abuse  alcohol use,   Endo/Other  Hypothyroidism   Renal/GU negative Renal ROS  negative genitourinary   Musculoskeletal  (+) Arthritis ,   Abdominal   Peds  Hematology negative hematology ROS (+)   Anesthesia Other Findings Echo 09/23/17: EF 50%, grade 2 dd, mild MR  Reproductive/Obstetrics                           Anesthesia Physical Anesthesia Plan  ASA: III  Anesthesia Plan: Spinal   Post-op Pain Management:    Induction: Intravenous  PONV Risk Score and Plan: 1 and Propofol infusion and Treatment may vary due to age or medical condition  Airway Management Planned: Natural Airway, Nasal Cannula and Simple Face Mask  Additional Equipment: None  Intra-op Plan:   Post-operative Plan:   Informed Consent: I have reviewed the patients History and Physical, chart, labs and discussed the procedure including the risks, benefits and alternatives for the proposed anesthesia with the patient or authorized representative who has indicated his/her understanding and acceptance.       Plan Discussed with:   Anesthesia Plan Comments: (See PAT note 11/13/2018, Konrad Felix, PA-C)      Anesthesia Quick Evaluation

## 2018-11-18 NOTE — Progress Notes (Signed)
Anesthesia Chart Review   Case: [id number] Date/Time: 11/20/18 1315   Procedure: TOTAL HIP ARTHROPLASTY ANTERIOR APPROACH (Right ) - 132min   Anesthesia type: Choice   Pre-op diagnosis: right hip avascular necrosis   Location: [facility] ROOM 09 / [facility]   Surgeon: [name], MD      DISCUSSION:63 y.o. former smoker (quit 09/18/97) with h/o GERD, hypothyroidism, nonobstructive CAD, anxiety, PAF, chronic diastolic CHF, OSA on CPAP, heavy ETOH use, right hip AVN scheduled for above procedure 11/20/2018 with Dr. [name].   Pt seen by cardiology 10/16/2018 for preoperative evaluation.  Per Melina Copa, PA-C, "revised cardiac risk index is 0.9% indicating low risk of CV complications from surgery. He is not describing any new concerning cardiac symptoms. Therefore, based on ACC/AHA guidelines, Marcelis Wissner would be at acceptable risk for the planned procedure without further cardiovascular testing. He does not have prior history of PCI so if needed, would be OK to hold aspirin for 5-7 days prior to procedure and resume as soon as felt safe. Since it has been over a year since his last EKG I would like him to get one prior to finalizing clearance. We will arrange. He has been screened for Covid via this telephone call and is aware to wear his own mask. Addendum 2:10 PM: EKG stable. OK to proceed with surgery from cardiac standpoint. I will cc copy of this note to requested fax number from 5/21 clearance note."  Anticipate pt can proceed with planned procedure barring acute status change.   VS: BP (!) 147/87   Pulse 64   Temp 36.9 C (Oral)   Resp 16   Ht 5\' 7"  (1.702 m)   Wt 106.6 kg   SpO2 100%   BMI 36.81 kg/m   PROVIDERS: Deland Pretty, MD is PCP   Ottie Glazier, MD is Cardiologist  LABS: Labs reviewed: Acceptable for surgery. (all labs ordered are listed, but only abnormal results are displayed)  Labs Reviewed  CBC - Abnormal; Notable for the following components:      Result Value    WBC 10.7 (*)    All other components within normal limits  COMPREHENSIVE METABOLIC PANEL - Abnormal; Notable for the following components:   Glucose, Bld 107 (*)    All other components within normal limits  SURGICAL PCR SCREEN  APTT  PROTIME-INR  TYPE AND SCREEN  ABO/RH     IMAGES:   EKG: 10/16/2018 Rate 61 bpm Normal sinus rhythm  Incomplete right bundle branch block   CV: Echo 09/23/17 Study Conclusions  - Left ventricle: The cavity size was mildly dilated. Wall   thickness was increased in a pattern of mild LVH. Systolic   function was at the lower limits of normal. The estimated   ejection fraction was 50%. Wall motion was normal; there were no   regional wall motion abnormalities. Features are consistent with   a pseudonormal left ventricular filling pattern, with concomitant   abnormal relaxation and increased filling pressure (grade 2   diastolic dysfunction). Doppler parameters are consistent with   high ventricular filling pressure. - Mitral valve: There was mild regurgitation. - Left atrium: The atrium was mildly dilated. - Right atrium: The atrium was mildly dilated. - Atrial septum: No defect or patent foramen ovale was identified. Past Medical History:  Diagnosis Date  . Anxiety   . Arthritis   . CAD (coronary artery disease)    a. nonobstructive by CT 2019.  [name] Chronic diastolic CHF (congestive heart failure) (  [place])   . Dysrhythmia   . ETOH abuse   . Former tobacco use   . GERD (gastroesophageal reflux disease)   . Hypertensive heart disease   . Hypertriglyceridemia   . Hypothyroidism   . Obesity   . Obstructive sleep apnea    a. On CPAP x ~ 10 yrs.  [name] PAF (paroxysmal atrial fibrillation) ([place])    a. Dx 08/13/2015, without recurrence.  . Pneumonia     Past Surgical History:  Procedure Laterality Date  . COLONOSCOPY    . NO PAST SURGERIES    . no prior surgery      MEDICATIONS: . ALPRAZolam (XANAX) 0.5 MG tablet  . aspirin EC 81 MG  tablet  . Cholecalciferol (VITAMIN D PO)  . Evolocumab (REPATHA SURECLICK) 016 MG/ML SOAJ  . fenofibrate 160 MG tablet  . folic acid (FOLVITE) 1 MG tablet  . furosemide (LASIX) 20 MG tablet  . levothyroxine (SYNTHROID, LEVOTHROID) 88 MCG tablet  . metoprolol tartrate (LOPRESSOR) 50 MG tablet  . Multiple Vitamins-Minerals (MULTIVITAMIN PO)  . Omega-3 Fatty Acids (FISH OIL) 1000 MG CAPS  . oxymetazoline (AFRIN) 0.05 % nasal spray  . thiamine 100 MG tablet  . valsartan (DIOVAN) 320 MG tablet  . venlafaxine XR (EFFEXOR-XR) 150 MG 24 hr capsule  . zolpidem (AMBIEN) 10 MG tablet   No current facility-administered medications for this encounter.      Maia Plan [facility] Pre-Surgical Testing [phone] 11/18/18  12:55 PM

## 2018-11-19 NOTE — H&P (Signed)
TOTAL HIP ADMISSION H&P  Patient is admitted for right total hip arthroplasty.  Subjective:  Chief Complaint: right hip pain  HPI: Carl Humphrey, 63 y.o. male, has a history of pain and functional disability in the right hip(s) due to arthritis and patient has failed non-surgical conservative treatments for greater than 12 weeks to include NSAID's and/or analgesics, corticosteriod injections and activity modification.  Onset of symptoms was gradual starting 3 years ago with gradually worsening course since that time.The patient noted no past surgery on the right hip(s).  Patient currently rates pain in the right hip at 6 out of 10 with activity. Patient has night pain, worsening of pain with activity and weight bearing, pain that interfers with activities of daily living and pain with passive range of motion. Patient has evidence of subchondral cysts, periarticular osteophytes and joint space narrowing by imaging studies. This condition presents safety issues increasing the risk of falls.  There is no current active infection.  Patient Active Problem List   Diagnosis Date Noted  . Statin intolerance 11/29/2017  . Coronary artery calcification seen on CT scan 11/08/2017  . CAD (coronary artery disease) 11/08/2017  . Chronic diastolic CHF (congestive heart failure) ([place]) 10/01/2017  . Claudication ([place]) 09/22/2017  . CAP (community acquired pneumonia) 09/21/2017  . Chest pain 09/21/2017  . Pleural effusion 09/21/2017  . PAF (paroxysmal atrial fibrillation) ([place]) 10/08/2015  . Encounter for long-term (current) use of other high-risk medications 10/08/2015  . Incomplete RBBB 10/08/2015  . Hypertensive heart disease   . Hyperlipidemia   . Obstructive sleep apnea   . Hypothyroidism   . GERD (gastroesophageal reflux disease)   . ETOH abuse   . Atrial fibrillation ([place])   . Hypertriglyceridemia    Past Medical History:  Diagnosis Date  . Anxiety   . Arthritis   . CAD (coronary artery  disease)    a. nonobstructive by CT 2019.  [name] Chronic diastolic CHF (congestive heart failure) ([place])   . Dysrhythmia   . ETOH abuse   . Former tobacco use   . GERD (gastroesophageal reflux disease)   . Hypertensive heart disease   . Hypertriglyceridemia   . Hypothyroidism   . Obesity   . Obstructive sleep apnea    a. On CPAP x ~ 10 yrs.  [name] PAF (paroxysmal atrial fibrillation) ([place])    a. Dx 08/13/2015, without recurrence.  . Pneumonia     Past Surgical History:  Procedure Laterality Date  . COLONOSCOPY    . NO PAST SURGERIES    . no prior surgery      No current facility-administered medications for this encounter.    Current Outpatient Medications  Medication Sig Dispense Refill Last Dose  . ALPRAZolam (XANAX) 0.5 MG tablet Take 0.25-0.5 mg by mouth daily as needed for anxiety.   0   . aspirin EC 81 MG tablet Take 1 tablet (81 mg total) by mouth daily. 90 tablet 3   . Cholecalciferol (VITAMIN D PO) Take 1 tablet by mouth daily.      . Evolocumab (REPATHA SURECLICK) 025 MG/ML SOAJ Inject 140 mg into the skin every 14 (fourteen) days. 2 pen 11   . fenofibrate 160 MG tablet Take 160 mg by mouth daily.      . folic acid (FOLVITE) 1 MG tablet Take 1 tablet (1 mg total) by mouth daily.     . furosemide (LASIX) 20 MG tablet Take 1 tablet (20 mg total) by mouth daily. 30 tablet 11   .  levothyroxine (SYNTHROID, LEVOTHROID) 88 MCG tablet Take 88 mcg by mouth daily before breakfast.   0   . metoprolol tartrate (LOPRESSOR) 50 MG tablet TAKE 1 TABLET BY MOUTH TWICE A DAY 180 tablet 2   . Multiple Vitamins-Minerals (MULTIVITAMIN PO) Take 1 tablet by mouth daily.      . Omega-3 Fatty Acids (FISH OIL) 1000 MG CAPS Take 2,000 mg by mouth daily.      [name] oxymetazoline (AFRIN) 0.05 % nasal spray Place 1 spray into both nostrils 2 (two) times daily. (Patient taking differently: Place 1 spray into both nostrils 2 (two) times daily as needed for congestion. ) 30 mL 0   . thiamine 100 MG tablet Take 1  tablet (100 mg total) by mouth daily.     . valsartan (DIOVAN) 320 MG tablet Take 320 mg by mouth daily.     [name] venlafaxine XR (EFFEXOR-XR) 150 MG 24 hr capsule Take 300 mg by mouth daily.   4   . zolpidem (AMBIEN) 10 MG tablet Take 10 mg by mouth at bedtime.  0    Allergies  Allergen Reactions  . Pravastatin Other (See Comments)    Reports causes muscle aches and joint pain  . Rosuvastatin Other (See Comments)    Pt reports causes muscle/joint pain    Social History   Tobacco Use  . Smoking status: Former Smoker    Quit date: 09/18/1997    Years since quitting: 21.1  . Smokeless tobacco: Former Systems developer    Quit date: 09/18/1997  Substance Use Topics  . Alcohol use: Yes    Alcohol/week: 2.0 standard drinks    Types: 1 Cans of beer, 1 Shots of liquor per week    Comment: He drinks 8-10 beers/day.    Family History  Problem Relation Age of Onset  . Hypertension Mother        alive @ 59  . Hypertension Father        died of heart failure @ 58  . Multiple sclerosis Sister        deceased  . Other Sister        A & W  . Other Sister        A & W  . Colon cancer Neg Hx   . Heart attack Neg Hx   . Stroke Neg Hx      Review of Systems  Constitutional: Negative.   HENT: Negative.   Eyes: Negative.   Respiratory: Negative.   Cardiovascular: Negative.   Gastrointestinal: Negative.   Genitourinary: Negative.   Musculoskeletal: Positive for joint pain and myalgias. Negative for back pain, falls and neck pain.  Skin: Negative.   Neurological: Negative.   Endo/Heme/Allergies: Negative.   Psychiatric/Behavioral: Negative.     Objective:  Physical Exam  Constitutional: He is oriented to person, place, and time. He appears well-developed. No distress.  Obese  HENT:  Head: Normocephalic and atraumatic.  Right Ear: External ear normal.  Left Ear: External ear normal.  Nose: Nose normal.  Mouth/Throat: Oropharynx is clear and moist.  Eyes: Conjunctivae and EOM are normal.   Neck: Normal range of motion. Neck supple.  Cardiovascular: Normal rate, regular rhythm, normal heart sounds and intact distal pulses.  No murmur heard. Respiratory: Effort normal and breath sounds normal. No respiratory distress. He has no wheezes.  GI: Soft. Bowel sounds are normal. He exhibits no distension. There is no abdominal tenderness.  Musculoskeletal:     Comments: Antalgic gait pattern on the right  without the use of assisted devices.  Left Hip Exam: ROM: normal without discomfort. There is no tenderness over the greater trochanter bursa. There is no pain on provocative testing of the hip.  Right Hip Exam: ROM: Flexion to 110, Internal Rotation 20, External Rotation 20, and abduction 35, which is a little less than what he has on the left side. There is no tenderness over the greater trochanter bursa. There is pain on provocative testing of the hip.  Neurological: He is alert and oriented to person, place, and time. He has normal strength. No sensory deficit.  Skin: No rash noted. He is not diaphoretic. No erythema.  Psychiatric: He has a normal mood and affect. His behavior is normal.    Ht: 5 ft 8 in  Wt: 225 lbs BMI: 34.2  BP: 122/70   Imaging Review Plain radiographs demonstrate severe degenerative joint disease of the right hip(s). The bone quality appears to be good for age and reported activity level.   Assessment/Plan:  End stage arthritis, right hip(s)  The patient history, physical examination, clinical judgement of the provider and imaging studies are consistent with end stage degenerative joint disease of the right hip(s) and total hip arthroplasty is deemed medically necessary. The treatment options including medical management, injection therapy, arthroscopy and arthroplasty were discussed at length. The risks and benefits of total hip arthroplasty were presented and reviewed. The risks due to aseptic loosening, infection, stiffness,  dislocation/subluxation,  thromboembolic complications and other imponderables were discussed.  The patient acknowledged the explanation, agreed to proceed with the plan and consent was signed. Patient is being admitted for inpatient treatment for surgery, pain control, PT, OT, prophylactic antibiotics, VTE prophylaxis, progressive ambulation and ADL's and discharge planning.The patient is planning to be discharged home with HEP.    Therapy Plans: HEP Disposition: Home with wife Planned DVT prophylaxis: Xarelto 10mg  daily due to hx of afib DME needed: walker PCP: Dr. Shelia Media at [facility]: Dr. Meda Coffee at [facility]  Other: no anesthesia concerns  Instructed on decreasing alcohol intake prior to surgery Instructed on medications to stop prior to surgery  Ardeen Jourdain, PA-C

## 2018-11-20 ENCOUNTER — Inpatient Hospital Stay (HOSPITAL_COMMUNITY)
Admission: RE | Admit: 2018-11-20 | Discharge: 2018-11-21 | DRG: 470 | Disposition: A | Discharge status: Home / Self Care | Payer: BC Managed Care – PPO | Attending: Orthopedic Surgery | Admitting: Orthopedic Surgery

## 2018-11-20 ENCOUNTER — Other Ambulatory Visit: Payer: Self-pay

## 2018-11-20 ENCOUNTER — Inpatient Hospital Stay (HOSPITAL_COMMUNITY): Payer: BC Managed Care – PPO | Admitting: Physician Assistant

## 2018-11-20 ENCOUNTER — Telehealth (HOSPITAL_COMMUNITY): Payer: Self-pay | Admitting: *Deleted

## 2018-11-20 ENCOUNTER — Inpatient Hospital Stay (HOSPITAL_COMMUNITY): Payer: BC Managed Care – PPO

## 2018-11-20 ENCOUNTER — Encounter (HOSPITAL_COMMUNITY)
Admission: RE | Disposition: A | Discharge status: Home / Self Care | Payer: Self-pay | Source: Home / Self Care | Attending: Orthopedic Surgery

## 2018-11-20 ENCOUNTER — Encounter (HOSPITAL_COMMUNITY): Payer: Self-pay | Admitting: General Practice

## 2018-11-20 ENCOUNTER — Inpatient Hospital Stay (HOSPITAL_COMMUNITY): Payer: BC Managed Care – PPO | Admitting: Certified Registered"

## 2018-11-20 DIAGNOSIS — Z96649 Presence of unspecified artificial hip joint: Secondary | ICD-10-CM

## 2018-11-20 DIAGNOSIS — F419 Anxiety disorder, unspecified: Secondary | ICD-10-CM | POA: Diagnosis present

## 2018-11-20 DIAGNOSIS — M1611 Unilateral primary osteoarthritis, right hip: Principal | ICD-10-CM | POA: Diagnosis present

## 2018-11-20 DIAGNOSIS — E669 Obesity, unspecified: Secondary | ICD-10-CM | POA: Diagnosis present

## 2018-11-20 DIAGNOSIS — I110 Hypertensive heart disease with heart failure: Secondary | ICD-10-CM | POA: Diagnosis present

## 2018-11-20 DIAGNOSIS — I2510 Atherosclerotic heart disease of native coronary artery without angina pectoris: Secondary | ICD-10-CM | POA: Diagnosis present

## 2018-11-20 DIAGNOSIS — G4733 Obstructive sleep apnea (adult) (pediatric): Secondary | ICD-10-CM | POA: Diagnosis present

## 2018-11-20 DIAGNOSIS — Z87891 Personal history of nicotine dependence: Secondary | ICD-10-CM | POA: Diagnosis not present

## 2018-11-20 DIAGNOSIS — I480 Paroxysmal atrial fibrillation: Secondary | ICD-10-CM | POA: Diagnosis present

## 2018-11-20 DIAGNOSIS — Z888 Allergy status to other drugs, medicaments and biological substances status: Secondary | ICD-10-CM

## 2018-11-20 DIAGNOSIS — Z820 Family history of epilepsy and other diseases of the nervous system: Secondary | ICD-10-CM | POA: Diagnosis not present

## 2018-11-20 DIAGNOSIS — Z8249 Family history of ischemic heart disease and other diseases of the circulatory system: Secondary | ICD-10-CM | POA: Diagnosis not present

## 2018-11-20 DIAGNOSIS — Z79890 Hormone replacement therapy (postmenopausal): Secondary | ICD-10-CM | POA: Diagnosis not present

## 2018-11-20 DIAGNOSIS — E039 Hypothyroidism, unspecified: Secondary | ICD-10-CM | POA: Diagnosis present

## 2018-11-20 DIAGNOSIS — M25761 Osteophyte, right knee: Secondary | ICD-10-CM | POA: Diagnosis present

## 2018-11-20 DIAGNOSIS — K219 Gastro-esophageal reflux disease without esophagitis: Secondary | ICD-10-CM | POA: Diagnosis present

## 2018-11-20 DIAGNOSIS — Z79899 Other long term (current) drug therapy: Secondary | ICD-10-CM

## 2018-11-20 DIAGNOSIS — E781 Pure hyperglyceridemia: Secondary | ICD-10-CM | POA: Diagnosis present

## 2018-11-20 DIAGNOSIS — Z419 Encounter for procedure for purposes other than remedying health state, unspecified: Secondary | ICD-10-CM

## 2018-11-20 DIAGNOSIS — I4510 Unspecified right bundle-branch block: Secondary | ICD-10-CM | POA: Diagnosis present

## 2018-11-20 DIAGNOSIS — M169 Osteoarthritis of hip, unspecified: Secondary | ICD-10-CM

## 2018-11-20 DIAGNOSIS — I5032 Chronic diastolic (congestive) heart failure: Secondary | ICD-10-CM | POA: Diagnosis present

## 2018-11-20 DIAGNOSIS — Z6836 Body mass index (BMI) 36.0-36.9, adult: Secondary | ICD-10-CM | POA: Diagnosis not present

## 2018-11-20 DIAGNOSIS — E785 Hyperlipidemia, unspecified: Secondary | ICD-10-CM | POA: Diagnosis present

## 2018-11-20 DIAGNOSIS — Z7982 Long term (current) use of aspirin: Secondary | ICD-10-CM

## 2018-11-20 HISTORY — PX: TOTAL HIP ARTHROPLASTY: SHX124

## 2018-11-20 LAB — TYPE AND SCREEN
ABO/RH(D): A NEG
Antibody Screen: NEGATIVE

## 2018-11-20 SURGERY — ARTHROPLASTY, HIP, TOTAL, ANTERIOR APPROACH
Anesthesia: General | Site: Hip | Laterality: Right

## 2018-11-20 MED ORDER — FUROSEMIDE 20 MG PO TABS
20.0000 mg | ORAL_TABLET | Freq: Every day | ORAL | Status: DC
  Administered 2018-11-21: 20 mg via ORAL
  Filled 2018-11-20: qty 1

## 2018-11-20 MED ORDER — SUCCINYLCHOLINE CHLORIDE 200 MG/10ML IV SOSY
PREFILLED_SYRINGE | INTRAVENOUS | Status: AC
  Filled 2018-11-20: qty 20

## 2018-11-20 MED ORDER — METHOCARBAMOL 500 MG PO TABS
500.0000 mg | ORAL_TABLET | Freq: Four times a day (QID) | ORAL | Status: DC | PRN
  Administered 2018-11-21: 500 mg via ORAL
  Filled 2018-11-20: qty 1

## 2018-11-20 MED ORDER — PHENYLEPHRINE 40 MCG/ML (10ML) SYRINGE FOR IV PUSH (FOR BLOOD PRESSURE SUPPORT)
PREFILLED_SYRINGE | INTRAVENOUS | Status: DC | PRN
  Administered 2018-11-20 (×2): 80 ug via INTRAVENOUS
  Administered 2018-11-20: 120 ug via INTRAVENOUS

## 2018-11-20 MED ORDER — OXYCODONE HCL 5 MG/5ML PO SOLN: 5.0000 mg | Freq: Once | ORAL | Status: DC | PRN

## 2018-11-20 MED ORDER — BUPIVACAINE-EPINEPHRINE (PF) 0.25% -1:200000 IJ SOLN
INTRAMUSCULAR | Status: DC | PRN
  Administered 2018-11-20: 30 mL

## 2018-11-20 MED ORDER — SODIUM CHLORIDE 0.9 % IV SOLN
INTRAVENOUS | Status: DC
  Administered 2018-11-20 – 2018-11-21 (×2): via INTRAVENOUS

## 2018-11-20 MED ORDER — DEXMEDETOMIDINE HCL IN NACL 200 MCG/50ML IV SOLN
INTRAVENOUS | Status: AC
  Filled 2018-11-20: qty 50

## 2018-11-20 MED ORDER — POLYETHYLENE GLYCOL 3350 17 G PO PACK: 17.0000 g | PACK | Freq: Every day | ORAL | Status: DC | PRN

## 2018-11-20 MED ORDER — DEXAMETHASONE SODIUM PHOSPHATE 10 MG/ML IJ SOLN
INTRAMUSCULAR | Status: DC | PRN
  Administered 2018-11-20: 8 mg via INTRAVENOUS

## 2018-11-20 MED ORDER — CEFAZOLIN SODIUM-DEXTROSE 2-4 GM/100ML-% IV SOLN
2.0000 g | INTRAVENOUS | Status: AC
  Administered 2018-11-20: 2 g via INTRAVENOUS
  Filled 2018-11-20: qty 100

## 2018-11-20 MED ORDER — BUPIVACAINE-EPINEPHRINE (PF) 0.25% -1:200000 IJ SOLN
INTRAMUSCULAR | Status: AC
  Filled 2018-11-20: qty 30

## 2018-11-20 MED ORDER — DEXAMETHASONE SODIUM PHOSPHATE 10 MG/ML IJ SOLN
INTRAMUSCULAR | Status: AC
  Filled 2018-11-20: qty 2

## 2018-11-20 MED ORDER — FENTANYL CITRATE (PF) 100 MCG/2ML IJ SOLN
INTRAMUSCULAR | Status: AC
  Filled 2018-11-20: qty 2

## 2018-11-20 MED ORDER — ONDANSETRON HCL 4 MG/2ML IJ SOLN: 4.0000 mg | Freq: Four times a day (QID) | INTRAMUSCULAR | Status: DC | PRN

## 2018-11-20 MED ORDER — BISACODYL 10 MG RE SUPP: 10.0000 mg | Freq: Every day | RECTAL | Status: DC | PRN

## 2018-11-20 MED ORDER — LEVOTHYROXINE SODIUM 88 MCG PO TABS
88.0000 ug | ORAL_TABLET | Freq: Every day | ORAL | Status: DC
  Administered 2018-11-21: 88 ug via ORAL
  Filled 2018-11-20: qty 1

## 2018-11-20 MED ORDER — FENTANYL CITRATE (PF) 250 MCG/5ML IJ SOLN
INTRAMUSCULAR | Status: AC
  Filled 2018-11-20: qty 5

## 2018-11-20 MED ORDER — MORPHINE SULFATE (PF) 2 MG/ML IV SOLN: 0.5000 mg | INTRAVENOUS | Status: DC | PRN

## 2018-11-20 MED ORDER — SUGAMMADEX SODIUM 500 MG/5ML IV SOLN
INTRAVENOUS | Status: DC | PRN
  Administered 2018-11-20: 220 mg via INTRAVENOUS

## 2018-11-20 MED ORDER — SUGAMMADEX SODIUM 500 MG/5ML IV SOLN
INTRAVENOUS | Status: AC
  Filled 2018-11-20: qty 5

## 2018-11-20 MED ORDER — METOPROLOL TARTRATE 50 MG PO TABS
50.0000 mg | ORAL_TABLET | Freq: Two times a day (BID) | ORAL | Status: DC
  Administered 2018-11-20 – 2018-11-21 (×2): 50 mg via ORAL
  Filled 2018-11-20 (×3): qty 1

## 2018-11-20 MED ORDER — PHENYLEPHRINE HCL (PRESSORS) 10 MG/ML IV SOLN
INTRAVENOUS | Status: AC
  Filled 2018-11-20: qty 2

## 2018-11-20 MED ORDER — ONDANSETRON HCL 4 MG/2ML IJ SOLN
INTRAMUSCULAR | Status: AC
  Filled 2018-11-20: qty 4

## 2018-11-20 MED ORDER — OXYCODONE HCL 5 MG PO TABS: 5.0000 mg | ORAL_TABLET | Freq: Once | ORAL | Status: DC | PRN

## 2018-11-20 MED ORDER — EPHEDRINE 5 MG/ML INJ
INTRAVENOUS | Status: AC
  Filled 2018-11-20: qty 20

## 2018-11-20 MED ORDER — METOCLOPRAMIDE HCL 5 MG/ML IJ SOLN: 5.0000 mg | Freq: Three times a day (TID) | INTRAMUSCULAR | Status: DC | PRN

## 2018-11-20 MED ORDER — ZOLPIDEM TARTRATE 10 MG PO TABS
10.0000 mg | ORAL_TABLET | Freq: Every day | ORAL | Status: DC
  Administered 2018-11-20: 10 mg via ORAL
  Filled 2018-11-20: qty 1

## 2018-11-20 MED ORDER — ACETAMINOPHEN 500 MG PO TABS
500.0000 mg | ORAL_TABLET | Freq: Four times a day (QID) | ORAL | Status: DC
  Administered 2018-11-20: 500 mg via ORAL
  Filled 2018-11-20: qty 1

## 2018-11-20 MED ORDER — POVIDONE-IODINE 10 % EX SWAB: 2.0000 "application " | Freq: Once | CUTANEOUS | Status: DC

## 2018-11-20 MED ORDER — FENTANYL CITRATE (PF) 100 MCG/2ML IJ SOLN
25.0000 ug | INTRAMUSCULAR | Status: DC | PRN
  Administered 2018-11-20 (×3): 50 ug via INTRAVENOUS

## 2018-11-20 MED ORDER — ONDANSETRON HCL 4 MG/2ML IJ SOLN
INTRAMUSCULAR | Status: DC | PRN
  Administered 2018-11-20: 4 mg via INTRAVENOUS

## 2018-11-20 MED ORDER — LIDOCAINE 2% (20 MG/ML) 5 ML SYRINGE
INTRAMUSCULAR | Status: DC | PRN
  Administered 2018-11-20: 60 mg via INTRAVENOUS

## 2018-11-20 MED ORDER — DEXAMETHASONE SODIUM PHOSPHATE 10 MG/ML IJ SOLN
10.0000 mg | Freq: Once | INTRAMUSCULAR | Status: AC
  Administered 2018-11-21: 10 mg via INTRAVENOUS
  Filled 2018-11-20: qty 1

## 2018-11-20 MED ORDER — DEXAMETHASONE SODIUM PHOSPHATE 10 MG/ML IJ SOLN: 8.0000 mg | Freq: Once | INTRAMUSCULAR | Status: DC

## 2018-11-20 MED ORDER — METHOCARBAMOL 500 MG IVPB - SIMPLE MED
INTRAVENOUS | Status: AC
  Filled 2018-11-20: qty 50

## 2018-11-20 MED ORDER — SUCCINYLCHOLINE CHLORIDE 200 MG/10ML IV SOSY
PREFILLED_SYRINGE | INTRAVENOUS | Status: DC | PRN
  Administered 2018-11-20: 100 mg via INTRAVENOUS
  Administered 2018-11-20: 40 mg via INTRAVENOUS

## 2018-11-20 MED ORDER — RIVAROXABAN 10 MG PO TABS
10.0000 mg | ORAL_TABLET | Freq: Every day | ORAL | Status: DC
  Administered 2018-11-21: 10 mg via ORAL
  Filled 2018-11-20: qty 1

## 2018-11-20 MED ORDER — SODIUM CHLORIDE 0.9 % IR SOLN
Status: DC | PRN
  Administered 2018-11-20: 1000 mL

## 2018-11-20 MED ORDER — VENLAFAXINE HCL ER 150 MG PO CP24
300.0000 mg | ORAL_CAPSULE | Freq: Every day | ORAL | Status: DC
  Administered 2018-11-21: 300 mg via ORAL
  Filled 2018-11-20: qty 2

## 2018-11-20 MED ORDER — FLEET ENEMA 7-19 GM/118ML RE ENEM: 1.0000 | ENEMA | Freq: Once | RECTAL | Status: DC | PRN

## 2018-11-20 MED ORDER — LIDOCAINE 2% (20 MG/ML) 5 ML SYRINGE
INTRAMUSCULAR | Status: AC
  Filled 2018-11-20: qty 20

## 2018-11-20 MED ORDER — ONDANSETRON HCL 4 MG PO TABS: 4.0000 mg | ORAL_TABLET | Freq: Four times a day (QID) | ORAL | Status: DC | PRN

## 2018-11-20 MED ORDER — PROPOFOL 10 MG/ML IV BOLUS
INTRAVENOUS | Status: AC
  Filled 2018-11-20: qty 60

## 2018-11-20 MED ORDER — MENTHOL 3 MG MT LOZG
1.0000 | LOZENGE | OROMUCOSAL | Status: DC | PRN
  Filled 2018-11-20: qty 9

## 2018-11-20 MED ORDER — ONDANSETRON HCL 4 MG/2ML IJ SOLN: 4.0000 mg | Freq: Once | INTRAMUSCULAR | Status: DC | PRN

## 2018-11-20 MED ORDER — METHOCARBAMOL 500 MG IVPB - SIMPLE MED
500.0000 mg | Freq: Four times a day (QID) | INTRAVENOUS | Status: DC | PRN
  Administered 2018-11-20: 500 mg via INTRAVENOUS
  Filled 2018-11-20: qty 50

## 2018-11-20 MED ORDER — VITAMIN B-1 100 MG PO TABS
100.0000 mg | ORAL_TABLET | Freq: Every day | ORAL | Status: DC
  Administered 2018-11-21: 100 mg via ORAL
  Filled 2018-11-20: qty 1

## 2018-11-20 MED ORDER — LACTATED RINGERS IV SOLN
INTRAVENOUS | Status: DC
  Administered 2018-11-20 (×2): via INTRAVENOUS

## 2018-11-20 MED ORDER — ALPRAZOLAM 0.25 MG PO TABS
0.2500 mg | ORAL_TABLET | Freq: Every day | ORAL | Status: DC | PRN
  Administered 2018-11-20: 0.25 mg via ORAL
  Filled 2018-11-20: qty 1

## 2018-11-20 MED ORDER — MIDAZOLAM HCL 2 MG/2ML IJ SOLN
INTRAMUSCULAR | Status: DC | PRN
  Administered 2018-11-20 (×2): 1 mg via INTRAVENOUS

## 2018-11-20 MED ORDER — PHENOL 1.4 % MT LIQD: 1.0000 | OROMUCOSAL | Status: DC | PRN

## 2018-11-20 MED ORDER — ACETAMINOPHEN 10 MG/ML IV SOLN
1000.0000 mg | Freq: Four times a day (QID) | INTRAVENOUS | Status: DC
  Administered 2018-11-20: 1000 mg via INTRAVENOUS
  Filled 2018-11-20: qty 100

## 2018-11-20 MED ORDER — HYDROCODONE-ACETAMINOPHEN 7.5-325 MG PO TABS
1.0000 | ORAL_TABLET | ORAL | Status: DC | PRN
  Administered 2018-11-20: 1 via ORAL
  Administered 2018-11-21 (×3): 2 via ORAL
  Filled 2018-11-20 (×3): qty 2
  Filled 2018-11-20: qty 1

## 2018-11-20 MED ORDER — ROCURONIUM BROMIDE 10 MG/ML (PF) SYRINGE
PREFILLED_SYRINGE | INTRAVENOUS | Status: AC
  Filled 2018-11-20: qty 20

## 2018-11-20 MED ORDER — CEFAZOLIN SODIUM-DEXTROSE 2-4 GM/100ML-% IV SOLN
2.0000 g | Freq: Four times a day (QID) | INTRAVENOUS | Status: AC
  Administered 2018-11-20 – 2018-11-21 (×2): 2 g via INTRAVENOUS
  Filled 2018-11-20 (×2): qty 100

## 2018-11-20 MED ORDER — METOPROLOL TARTRATE 50 MG PO TABS
50.0000 mg | ORAL_TABLET | ORAL | Status: AC
  Administered 2018-11-20: 12:00:00 50 mg via ORAL
  Filled 2018-11-20: qty 1

## 2018-11-20 MED ORDER — MIDAZOLAM HCL 2 MG/2ML IJ SOLN
INTRAMUSCULAR | Status: AC
  Filled 2018-11-20: qty 2

## 2018-11-20 MED ORDER — CHLORHEXIDINE GLUCONATE 4 % EX LIQD: 60.0000 mL | Freq: Once | CUTANEOUS | Status: DC

## 2018-11-20 MED ORDER — HYDROCODONE-ACETAMINOPHEN 5-325 MG PO TABS
1.0000 | ORAL_TABLET | ORAL | Status: DC | PRN
  Administered 2018-11-21: 2 via ORAL
  Filled 2018-11-20: qty 2

## 2018-11-20 MED ORDER — DIPHENHYDRAMINE HCL 12.5 MG/5ML PO ELIX: 12.5000 mg | ORAL_SOLUTION | ORAL | Status: DC | PRN

## 2018-11-20 MED ORDER — EPHEDRINE SULFATE-NACL 50-0.9 MG/10ML-% IV SOSY
PREFILLED_SYRINGE | INTRAVENOUS | Status: DC | PRN
  Administered 2018-11-20: 20 mg via INTRAVENOUS
  Administered 2018-11-20: 10 mg via INTRAVENOUS
  Administered 2018-11-20: 20 mg via INTRAVENOUS

## 2018-11-20 MED ORDER — PROPOFOL 10 MG/ML IV BOLUS
INTRAVENOUS | Status: DC | PRN
  Administered 2018-11-20: 20 mg via INTRAVENOUS
  Administered 2018-11-20: 160 mg via INTRAVENOUS
  Administered 2018-11-20: 20 mg via INTRAVENOUS

## 2018-11-20 MED ORDER — PHENYLEPHRINE 40 MCG/ML (10ML) SYRINGE FOR IV PUSH (FOR BLOOD PRESSURE SUPPORT)
PREFILLED_SYRINGE | INTRAVENOUS | Status: AC
  Filled 2018-11-20: qty 20

## 2018-11-20 MED ORDER — DEXMEDETOMIDINE HCL 200 MCG/2ML IV SOLN
INTRAVENOUS | Status: DC | PRN
  Administered 2018-11-20 (×3): 4 ug via INTRAVENOUS

## 2018-11-20 MED ORDER — DOCUSATE SODIUM 100 MG PO CAPS
100.0000 mg | ORAL_CAPSULE | Freq: Two times a day (BID) | ORAL | Status: DC
  Administered 2018-11-20 – 2018-11-21 (×2): 100 mg via ORAL
  Filled 2018-11-20 (×2): qty 1

## 2018-11-20 MED ORDER — FENTANYL CITRATE (PF) 250 MCG/5ML IJ SOLN
INTRAMUSCULAR | Status: DC | PRN
  Administered 2018-11-20 (×3): 50 ug via INTRAVENOUS
  Administered 2018-11-20: 100 ug via INTRAVENOUS
  Administered 2018-11-20: 50 ug via INTRAVENOUS

## 2018-11-20 MED ORDER — TRANEXAMIC ACID-NACL 1000-0.7 MG/100ML-% IV SOLN
1000.0000 mg | Freq: Once | INTRAVENOUS | Status: AC
  Administered 2018-11-20: 1000 mg via INTRAVENOUS
  Filled 2018-11-20: qty 100

## 2018-11-20 MED ORDER — FENOFIBRATE 160 MG PO TABS
160.0000 mg | ORAL_TABLET | Freq: Every day | ORAL | Status: DC
  Administered 2018-11-21: 160 mg via ORAL
  Filled 2018-11-20: qty 1

## 2018-11-20 MED ORDER — IRBESARTAN 150 MG PO TABS
300.0000 mg | ORAL_TABLET | Freq: Every day | ORAL | Status: DC
  Administered 2018-11-21: 300 mg via ORAL
  Filled 2018-11-20: qty 2

## 2018-11-20 MED ORDER — TRANEXAMIC ACID-NACL 1000-0.7 MG/100ML-% IV SOLN
1000.0000 mg | INTRAVENOUS | Status: AC
  Administered 2018-11-20: 1000 mg via INTRAVENOUS
  Filled 2018-11-20: qty 100

## 2018-11-20 MED ORDER — METOCLOPRAMIDE HCL 5 MG PO TABS: 5.0000 mg | ORAL_TABLET | Freq: Three times a day (TID) | ORAL | Status: DC | PRN

## 2018-11-20 SURGICAL SUPPLY — 40 items
BAG ZIPLOCK 12X15 (MISCELLANEOUS) IMPLANT
BALL HIP CERAMIC (Hips) ×1 IMPLANT
BLADE SAG 18X100X1.27 (BLADE) ×2 IMPLANT
COVER PERINEAL POST (MISCELLANEOUS) ×2 IMPLANT
COVER SURGICAL LIGHT HANDLE (MISCELLANEOUS) ×2 IMPLANT
COVER WAND RF STERILE (DRAPES) IMPLANT
CUP ACET PINNACLE SECTR 50MM (Hips) ×1 IMPLANT
DECANTER SPIKE VIAL GLASS SM (MISCELLANEOUS) ×2 IMPLANT
DRAPE STERI IOBAN 125X83 (DRAPES) ×2 IMPLANT
DRAPE U-SHAPE 47X51 STRL (DRAPES) ×4 IMPLANT
DRSG ADAPTIC 3X8 NADH LF (GAUZE/BANDAGES/DRESSINGS) ×2 IMPLANT
DRSG MEPILEX BORDER 4X4 (GAUZE/BANDAGES/DRESSINGS) ×2 IMPLANT
DRSG MEPILEX BORDER 4X8 (GAUZE/BANDAGES/DRESSINGS) ×2 IMPLANT
DURAPREP 26ML APPLICATOR (WOUND CARE) ×2 IMPLANT
ELECT REM PT RETURN 15FT ADLT (MISCELLANEOUS) ×2 IMPLANT
EVACUATOR 1/8 PVC DRAIN (DRAIN) ×2 IMPLANT
GLOVE BIO SURGEON STRL SZ 6 (GLOVE) ×2 IMPLANT
GLOVE BIO SURGEON STRL SZ8 (GLOVE) ×2 IMPLANT
GLOVE BIOGEL PI IND STRL 6.5 (GLOVE) ×1 IMPLANT
GLOVE BIOGEL PI IND STRL 8 (GLOVE) ×1 IMPLANT
GLOVE BIOGEL PI INDICATOR 6.5 (GLOVE) ×1
GLOVE BIOGEL PI INDICATOR 8 (GLOVE) ×1
GOWN STRL REUS W/TWL LRG LVL3 (GOWN DISPOSABLE) ×4 IMPLANT
HEAD FEMORAL 32 CERAMIC (Hips) ×2 IMPLANT
HIP BALL CERAMIC (Hips) ×2 IMPLANT
HOLDER FOLEY CATH W/STRAP (MISCELLANEOUS) ×2 IMPLANT
KIT TURNOVER KIT A (KITS) IMPLANT
LINER MARATHON 32 50 (Hips) ×2 IMPLANT
MANIFOLD NEPTUNE II (INSTRUMENTS) ×2 IMPLANT
PACK ANTERIOR HIP CUSTOM (KITS) ×2 IMPLANT
PINNACLE SECTOR CUP 50MM (Hips) ×2 IMPLANT
STEM FEMORAL SZ6 HIGH ACTIS (Stem) ×2 IMPLANT
STRIP CLOSURE SKIN 1/2X4 (GAUZE/BANDAGES/DRESSINGS) ×2 IMPLANT
SUT ETHIBOND NAB CT1 #1 30IN (SUTURE) ×2 IMPLANT
SUT MNCRL AB 4-0 PS2 18 (SUTURE) ×2 IMPLANT
SUT STRATAFIX 0 PDS 27 VIOLET (SUTURE) ×2
SUT VIC AB 2-0 CT1 27 (SUTURE) ×2
SUT VIC AB 2-0 CT1 TAPERPNT 27 (SUTURE) ×2 IMPLANT
SUTURE STRATFX 0 PDS 27 VIOLET (SUTURE) ×1 IMPLANT
YANKAUER SUCT BULB TIP 10FT TU (MISCELLANEOUS) ×2 IMPLANT

## 2018-11-20 NOTE — Anesthesia Postprocedure Evaluation (Signed)
Anesthesia Post Note  Patient: Carl Humphrey  Procedure(s) Performed: TOTAL HIP ARTHROPLASTY ANTERIOR APPROACH (Right Hip)     Patient location during evaluation: PACU Anesthesia Type: General Level of consciousness: awake and alert Pain management: pain level controlled Vital Signs Assessment: post-procedure vital signs reviewed and stable Respiratory status: spontaneous breathing, nonlabored ventilation and respiratory function stable Cardiovascular status: blood pressure returned to baseline and stable Postop Assessment: no apparent nausea or vomiting Anesthetic complications: no    Last Vitals:  Vitals:   11/20/18 1643 11/20/18 1755  BP: (!) 160/98 (!) 142/77  Pulse: 68 66  Resp: 14 16  Temp: 36.4 C   SpO2: 98% 96%    Last Pain:  Vitals:   11/20/18 1830  TempSrc:   PainSc: 3                  Lidia Collum

## 2018-11-20 NOTE — Discharge Instructions (Addendum)
°Dr. Frank Aluisio °Total Joint Specialist °[facility] °[address] °[place], [place] [address] °[phone] ° °ANTERIOR APPROACH TOTAL HIP REPLACEMENT POSTOPERATIVE DIRECTIONS ° ° °Hip Rehabilitation, Guidelines Following Surgery  °The results of a hip operation are greatly improved after range of motion and muscle strengthening exercises. Follow all safety measures which are given to protect your hip. If any of these exercises cause increased pain or swelling in your joint, decrease the amount until you are comfortable again. Then slowly increase the exercises. Call your caregiver if you have problems or questions.  ° °HOME CARE INSTRUCTIONS  °• Remove items at home which could result in a fall. This includes throw rugs or furniture in walking pathways.  °· ICE to the affected hip every three hours for 30 minutes at a time and then as needed for pain and swelling.  Continue to use ice on the hip for pain and swelling from surgery. You may notice swelling that will progress down to the foot and ankle.  This is normal after surgery.  Elevate the leg when you are not up walking on it.   °· Continue to use the breathing machine which will help keep your temperature down.  It is common for your temperature to cycle up and down following surgery, especially at night when you are not up moving around and exerting yourself.  The breathing machine keeps your lungs expanded and your temperature down. ° °DIET °You may resume your previous home diet once your are discharged from the hospital. ° °DRESSING / WOUND CARE / SHOWERING °You may change your dressing 3-5 days after surgery.  Then change the dressing every day with sterile gauze.  Please use good hand washing techniques before changing the dressing.  Do not use any lotions or creams on the incision until instructed by your surgeon. °You may start showering once you are discharged home but do not submerge the incision under water. Just pat the  incision dry and apply a dry gauze dressing on daily. °Change the surgical dressing daily and reapply a dry dressing each time. ° °ACTIVITY °Walk with your walker as instructed. °Use walker as long as suggested by your caregivers. °Avoid periods of inactivity such as sitting longer than an hour when not asleep. This helps prevent blood clots.  °You may resume a sexual relationship in one month or when given the OK by your doctor.  °You may return to work once you are cleared by your doctor.  °Do not drive a car for 6 weeks or until released by you surgeon.  °Do not drive while taking narcotics. ° °WEIGHT BEARING °Weight bearing as tolerated with assist device (walker, cane, etc) as directed, use it as long as suggested by your surgeon or therapist, typically at least 4-6 weeks. ° °POSTOPERATIVE CONSTIPATION PROTOCOL °Constipation - defined medically as fewer than three stools per week and severe constipation as less than one stool per week. ° °One of the most common issues patients have following surgery is constipation.  Even if you have a regular bowel pattern at home, your normal regimen is likely to be disrupted due to multiple reasons following surgery.  Combination of anesthesia, postoperative narcotics, change in appetite and fluid intake all can affect your bowels.  In order to avoid complications following surgery, here are some recommendations in order to help you during your recovery period. ° °Colace (docusate) - Pick up an over-the-counter form of Colace or another stool softener and take twice a day   as long as you are requiring postoperative pain medications.  Take with a full glass of water daily.  If you experience loose stools or diarrhea, hold the colace until you stool forms back up.  If your symptoms do not get better within 1 week or if they get worse, check with your doctor. ° °Dulcolax (bisacodyl) - Pick up over-the-counter and take as directed by the product packaging as needed to assist with  the movement of your bowels.  Take with a full glass of water.  Use this product as needed if not relieved by Colace only.  ° °MiraLax (polyethylene glycol) - Pick up over-the-counter to have on hand.  MiraLax is a solution that will increase the amount of water in your bowels to assist with bowel movements.  Take as directed and can mix with a glass of water, juice, soda, coffee, or tea.  Take if you go more than two days without a movement. °Do not use MiraLax more than once per day. Call your doctor if you are still constipated or irregular after using this medication for 7 days in a row. ° °If you continue to have problems with postoperative constipation, please contact the office for further assistance and recommendations.  If you experience "the worst abdominal pain ever" or develop nausea or vomiting, please contact the office immediatly for further recommendations for treatment. ° °ITCHING ° If you experience itching with your medications, try taking only a single pain pill, or even half a pain pill at a time.  You can also use Benadryl over the counter for itching or also to help with sleep.  ° °TED HOSE STOCKINGS °Wear the elastic stockings on both legs for three weeks following surgery during the day but you may remove then at night for sleeping. ° °MEDICATIONS °See your medication summary on the “After Visit Summary” that the nursing staff will review with you prior to discharge.  You may have some home medications which will be placed on hold until you complete the course of blood thinner medication.  It is important for you to complete the blood thinner medication as prescribed by your surgeon.  Continue your approved medications as instructed at time of discharge. ° °PRECAUTIONS °If you experience chest pain or shortness of breath - call 911 immediately for transfer to the hospital emergency department.  °If you develop a fever greater that 101 F, purulent drainage from wound, increased redness or  drainage from wound, foul odor from the wound/dressing, or calf pain - CONTACT YOUR SURGEON.   °                                                °FOLLOW-UP APPOINTMENTS °Make sure you keep all of your appointments after your operation with your surgeon and caregivers. You should call the office at the above phone number and make an appointment for approximately two weeks after the date of your surgery or on the date instructed by your surgeon outlined in the "After Visit Summary". ° °RANGE OF MOTION AND STRENGTHENING EXERCISES  °These exercises are designed to help you keep full movement of your hip joint. Follow your caregiver's or physical therapist's instructions. Perform all exercises about fifteen times, three times per day or as directed. Exercise both hips, even if you have had only one joint replacement. These exercises can be done on   a training (exercise) mat, on the floor, on a table or on a bed. Use whatever works the best and is most comfortable for you. Use music or television while you are exercising so that the exercises are a pleasant break in your day. This will make your life better with the exercises acting as a break in routine you can look forward to.   Lying on your back, slowly slide your foot toward your buttocks, raising your knee up off the floor. Then slowly slide your foot back down until your leg is straight again.   Lying on your back spread your legs as far apart as you can without causing discomfort.   Lying on your side, raise your upper leg and foot straight up from the floor as far as is comfortable. Slowly lower the leg and repeat.   Lying on your back, tighten up the muscle in the front of your thigh (quadriceps muscles). You can do this by keeping your leg straight and trying to raise your heel off the floor. This helps strengthen the largest muscle supporting your knee.   Lying on your back, tighten up the muscles of your buttocks both with the legs straight and with  the knee bent at a comfortable angle while keeping your heel on the floor.   IF YOU ARE TRANSFERRED TO A SKILLED REHAB FACILITY If the patient is transferred to a skilled rehab facility following release from the hospital, a list of the current medications will be sent to the facility for the patient to continue.  When discharged from the skilled rehab facility, please have the facility set up the patient's [place] prior to being released. Also, the skilled facility will be responsible for providing the patient with their medications at time of release from the facility to include their pain medication, the muscle relaxants, and their blood thinner medication. If the patient is still at the rehab facility at time of the two week follow up appointment, the skilled rehab facility will also need to assist the patient in arranging follow up appointment in our office and any transportation needs.  MAKE SURE YOU:   Understand these instructions.   Get help right away if you are not doing well or get worse.    Pick up stool softner and laxative for home use following surgery while on pain medications. Do not submerge incision under water. Please use good hand washing techniques while changing dressing each day. May shower starting three days after surgery. Please use a clean towel to pat the incision dry following showers. Continue to use ice for pain and swelling after surgery. Do not use any lotions or creams on the incision until instructed by your surgeon.   Information on my medicine - XARELTO (Rivaroxaban)  Why was Xarelto prescribed for you? Xarelto was prescribed for you to reduce the risk of blood clots forming after orthopedic surgery. The medical term for these abnormal blood clots is venous thromboembolism (VTE).  What do you need to know about xarelto ? Take your Xarelto ONCE DAILY at the same time every day. You may take it either with or without  food.  If you have difficulty swallowing the tablet whole, you may crush it and mix in applesauce just prior to taking your dose.  Take Xarelto exactly as prescribed by your doctor and DO NOT stop taking Xarelto without talking to the doctor who prescribed the medication.  Stopping without other VTE prevention medication to take the  place of Xarelto may increase your risk of developing a clot.  After discharge, you should have regular check-up appointments with your healthcare provider that is prescribing your Xarelto.    What do you do if you miss a dose? If you miss a dose, take it as soon as you remember on the same day then continue your regularly scheduled once daily regimen the next day. Do not take two doses of Xarelto on the same day.   Important Safety Information A possible side effect of Xarelto is bleeding. You should call your healthcare provider right away if you experience any of the following: ? Bleeding from an injury or your nose that does not stop. ? Unusual colored urine (red or dark brown) or unusual colored stools (red or black). ? Unusual bruising for unknown reasons. ? A serious fall or if you hit your head (even if there is no bleeding).  Some medicines may interact with Xarelto and might increase your risk of bleeding while on Xarelto. To help avoid this, consult your healthcare provider or pharmacist prior to using any new prescription or non-prescription medications, including herbals, vitamins, non-steroidal anti-inflammatory drugs (NSAIDs) and supplements.  This website has more information on Xarelto: [email or web address]

## 2018-11-20 NOTE — Evaluation (Signed)
Physical Therapy Evaluation Patient Details Name: Carl Humphrey MRN: [id number] DOB: 1955-05-25 Today's Date: 11/20/2018   History of Present Illness  s/p R DA THA  Clinical Impression  Pt is s/p THA resulting in the deficits listed below (see PT Problem List).  Pt amb ~ 25', mildly dizzy although likely related to meds as it did not change with positions/gait. Anticipate steady progress, pt for possible d/c tomorrow   Pt will benefit from skilled PT to increase their independence and safety with mobility to allow discharge to the venue listed below.      Follow Up Recommendations Follow surgeon's recommendation for DC plan and follow-up therapies(HEP)    Equipment Recommendations  None recommended by PT    Recommendations for Other Services       Precautions / Restrictions Precautions Precautions: Fall Restrictions Weight Bearing Restrictions: No Other Position/Activity Restrictions: WBAT      Mobility  Bed Mobility Overal bed mobility: Needs Assistance Bed Mobility: Supine to Sit     Supine to sit: Min guard     General bed mobility comments: for safety  Transfers Overall transfer level: Needs assistance Equipment used: Rolling walker (2 wheeled) Transfers: Sit to/from Stand           General transfer comment: cues for hand placement and RLE position  Ambulation/Gait Ambulation/Gait assistance: Min guard Gait Distance (Feet): 25 Feet Assistive device: Rolling walker (2 wheeled) Gait Pattern/deviations: Step-to pattern;Decreased weight shift to right;Wide base of support     General Gait Details: cues for RW position,  sequence  Stairs            Wheelchair Mobility    Modified Rankin (Stroke Patients Only)       Balance                                             Pertinent Vitals/Pain Pain Assessment: 0-10 Pain Score: 5  Pain Location: right hip Pain Descriptors / Indicators: Aching;Sore Pain Intervention(s):  Limited activity within patient's tolerance;Monitored during session;Premedicated before session;Repositioned    Home Living Family/patient expects to be discharged to:: Private residence Living Arrangements: Spouse/significant other Available Help at Discharge: Family Type of Home: House Home Access: Stairs to enter   [employer] of Steps: 2 Home Layout: Two level;Able to live on main level with bedroom/bathroom Home Equipment: Gilford Rile - 2 wheels      Prior Function Level of Independence: Independent               Hand Dominance        Extremity/Trunk Assessment   Upper Extremity Assessment Upper Extremity Assessment: Overall WFL for tasks assessed    Lower Extremity Assessment Lower Extremity Assessment: RLE deficits/detail RLE Deficits / Details: ankle WFL; knee and hip grossly 3/5/ limited by post op pain and weakness       Communication   Communication: No difficulties  Cognition Arousal/Alertness: Awake/alert Behavior During Therapy: WFL for tasks assessed/performed Overall Cognitive Status: Within Functional Limits for tasks assessed                                        General Comments      Exercises Total Joint Exercises Ankle Circles/Pumps: AROM;10 reps   Assessment/Plan    PT Assessment Patient needs continued  PT services  PT Problem List Decreased strength;Decreased activity tolerance;Decreased mobility;Decreased knowledge of use of DME;Pain       PT Treatment Interventions DME instruction;Gait training;Functional mobility training;Therapeutic activities;Patient/family education;Therapeutic exercise    PT Goals (Current goals can be found in the Care Plan section)  Acute Rehab PT Goals PT Goal Formulation: With patient Time For Goal Achievement: 12/10/18 Potential to Achieve Goals: Good    Frequency 7X/week   Barriers to discharge        Co-evaluation               AM-PAC PT "6 Clicks" Mobility   Outcome Measure Help needed turning from your back to your side while in a flat bed without using bedrails?: A Little Help needed moving from lying on your back to sitting on the side of a flat bed without using bedrails?: A Little Help needed moving to and from a bed to a chair (including a wheelchair)?: A Little Help needed standing up from a chair using your arms (e.g., wheelchair or bedside chair)?: A Little Help needed to walk in hospital room?: A Little Help needed climbing 3-5 steps with a railing? : A Lot 6 Click Score: 17    End of Session   Activity Tolerance: Patient tolerated treatment well Patient left: in chair;with call bell/phone within reach;with chair alarm set   PT Visit Diagnosis: Difficulty in walking, not elsewhere classified (R26.2)    Time: 3383-2919 PT Time Calculation (min) (ACUTE ONLY): 28 min   Charges:   PT Evaluation $PT Eval Low Complexity: 1 Low PT Treatments $Gait Training: 8-22 mins        Kenyon Ana, PT  Pager: [phone] Acute Rehab Dept [facility]): [phone]   11/20/2018   [facility] 11/20/2018, 6:58 PM

## 2018-11-20 NOTE — Anesthesia Procedure Notes (Signed)
Procedure Name: Intubation Date/Time: 11/20/2018 12:40 PM Performed by: Cynda Familia, CRNA Pre-anesthesia Checklist: Patient identified, Emergency Drugs available, Suction available and Patient being monitored Patient Re-evaluated:Patient Re-evaluated prior to induction Oxygen Delivery Method: Circle System Utilized Preoxygenation: Pre-oxygenation with 100% oxygen Induction Type: IV induction, Rapid sequence and Cricoid Pressure applied Ventilation: Mask ventilation without difficulty Tube type: Oral Tube size: 7.5 mm Number of attempts: 1 Airway Equipment and Method: Stylet and Video-laryngoscopy Placement Confirmation: ETT inserted through vocal cords under direct vision,  positive ETCO2 and breath sounds checked- equal and bilateral Secured at: 22 cm Tube secured with: Tape Dental Injury: Teeth and Oropharynx as per pre-operative assessment  Comments: Smooth IV induction-- Witman-- intubation AM CRNA atraumatic with glidescope-- bilat BS Witman

## 2018-11-20 NOTE — Plan of Care (Signed)
Plan of care 

## 2018-11-20 NOTE — Interval H&P Note (Signed)
History and Physical Interval Note:  11/20/2018 11:22 AM  Carl Humphrey  has presented today for surgery, with the diagnosis of right hip avascular necrosis.  The various methods of treatment have been discussed with the patient and family. After consideration of risks, benefits and other options for treatment, the patient has consented to  Procedure(s) with comments: [name] (Right) - 171min as a surgical intervention.  The patient's history has been reviewed, patient examined, no change in status, stable for surgery.  I have reviewed the patient's chart and labs.  Questions were answered to the patient's satisfaction.     Pilar Plate Nohlan Burdin

## 2018-11-20 NOTE — Op Note (Signed)
OPERATIVE REPORT- TOTAL HIP ARTHROPLASTY   PREOPERATIVE DIAGNOSIS: Osteoarthritis of the Right hip.   POSTOPERATIVE DIAGNOSIS: Osteoarthritis of the Right  hip.   PROCEDURE: Right total hip arthroplasty, anterior approach.   SURGEON: [name], MD   ASSISTANT: Griffith Citron, PA-C  ANESTHESIA:  General  ESTIMATED BLOOD LOSS:-500 mL    DRAINS: Hemovac x1.   COMPLICATIONS: None   CONDITION: PACU - hemodynamically stable.   BRIEF CLINICAL NOTE: Carl Humphrey is a 63 y.o. male who has advanced end-  stage arthritis of their Right  hip with progressively worsening pain and  dysfunction.The patient has failed nonoperative management and presents for  total hip arthroplasty.   PROCEDURE IN DETAIL: After successful administration of spinal  anesthetic, the traction boots for the [facility] bed were placed on both  feet and the patient was placed onto the [facility] bed, boots placed into the leg  holders. The Right hip was then isolated from the perineum with plastic  drapes and prepped and draped in the usual sterile fashion. ASIS and  greater trochanter were marked and a oblique incision was made, starting  at about 1 cm lateral and 2 cm distal to the ASIS and coursing towards  the anterior cortex of the femur. The skin was cut with a 10 blade  through subcutaneous tissue to the level of the fascia overlying the  tensor fascia lata muscle. The fascia was then incised in line with the  incision at the junction of the anterior third and posterior 2/3rd. The  muscle was teased off the fascia and then the interval between the TFL  and the rectus was developed. The Hohmann retractor was then placed at  the top of the femoral neck over the capsule. The vessels overlying the  capsule were cauterized and the fat on top of the capsule was removed.  A Hohmann retractor was then placed anterior underneath the rectus  femoris to give exposure to the entire anterior capsule. A T-shaped   capsulotomy was performed. The edges were tagged and the femoral head  was identified.       Osteophytes are removed off the superior acetabulum.  The femoral neck was then cut in situ with an oscillating saw. Traction  was then applied to the left lower extremity utilizing the [facility]  traction. The femoral head was then removed. Retractors were placed  around the acetabulum and then circumferential removal of the labrum was  performed. Osteophytes were also removed. Reaming starts at 47 mm to  medialize and  Increased in 2 mm increments to 49 mm. We reamed in  approximately 40 degrees of abduction, 20 degrees anteversion. A 50 mm  pinnacle acetabular shell was then impacted in anatomic position under  fluoroscopic guidance with excellent purchase. We did not need to place  any additional dome screws. A 32 mm neutral + 4 marathon liner was then  placed into the acetabular shell.       The femoral lift was then placed along the lateral aspect of the femur  just distal to the vastus ridge. The leg was  externally rotated and capsule  was stripped off the inferior aspect of the femoral neck down to the  level of the lesser trochanter, this was done with electrocautery. The femur was lifted after this was performed. The  leg was then placed in an extended and adducted position essentially delivering the femur. We also removed the capsule superiorly and the piriformis from the piriformis fossa  to gain excellent exposure of the  proximal femur. Rongeur was used to remove some cancellous bone to get  into the lateral portion of the proximal femur for placement of the  initial starter reamer. The starter broaches was placed  the starter broach  and was shown to go down the center of the canal. Broaching  with the Actis system was then performed starting at size 0  coursing  Up to size 6. A size 6 had excellent torsional and rotational  and axial stability. The trial high offset neck was then placed   with a 32 + 5 trial head. The hip was then reduced. We confirmed that  the stem was in the canal both on AP and lateral x-rays. It also has excellent sizing. The hip was reduced with outstanding stability through full extension and full external rotation.. AP pelvis was taken and the leg lengths were measured and found to be equal. Hip was then dislocated again and the femoral head and neck removed. The  femoral broach was removed. Size 6 Actis stem with a high offset  neck was then impacted into the femur following native anteversion. Has  excellent purchase in the canal. Excellent torsional and rotational and  axial stability. It is confirmed to be in the canal on AP and lateral  fluoroscopic views. The 32 + 5 ceramic head was placed and the hip  reduced with outstanding stability. Again AP pelvis was taken and it  confirmed that the leg lengths were equal. The wound was then copiously  irrigated with saline solution and the capsule reattached and repaired  with Ethibond suture. 30 ml of .25% Bupivicaine was  injected into the capsule and into the edge of the tensor fascia lata as well as subcutaneous tissue. The fascia overlying the tensor fascia lata was then closed with a running #1 V-Loc. Subcu was closed with interrupted 2-0 Vicryl and subcuticular running 4-0 Monocryl. Incision was cleaned  and dried. Steri-Strips and a bulky sterile dressing applied. Hemovac  drain was hooked to suction and then the patient was awakened and transported to  recovery in stable condition.        Please note that a surgical assistant was a medical necessity for this procedure to perform it in a safe and expeditious manner. Assistant was necessary to provide appropriate retraction of vital neurovascular structures and to prevent femoral fracture and allow for anatomic placement of the prosthesis.  [name], M.D.

## 2018-11-20 NOTE — Transfer of Care (Signed)
Immediate Anesthesia Transfer of Care Note  Patient: Carl Humphrey  Procedure(s) Performed: TOTAL HIP ARTHROPLASTY ANTERIOR APPROACH (Right Hip)  Patient Location: PACU  Anesthesia Type:General  Level of Consciousness: awake, alert , oriented and patient cooperative  Airway & Oxygen Therapy: Patient Spontanous Breathing and Patient connected to face mask oxygen  Post-op Assessment: Report given to RN and Post -op Vital signs reviewed and stable  Post vital signs: Reviewed and stable  Last Vitals:  Vitals Value Taken Time  BP    Temp    Pulse 69 11/20/18 1432  Resp 12 11/20/18 1432  SpO2 100 % 11/20/18 1432  Vitals shown include unvalidated device data.  Last Pain:  Vitals:   11/20/18 1110  TempSrc:   PainSc: 0-No pain         Complications: No apparent anesthesia complications

## 2018-11-21 ENCOUNTER — Encounter (HOSPITAL_COMMUNITY): Payer: Self-pay | Admitting: Orthopedic Surgery

## 2018-11-21 LAB — BASIC METABOLIC PANEL
Anion gap: 10 (ref 5–15)
BUN: 12 mg/dL (ref 8–23)
CO2: 26 mmol/L (ref 22–32)
Calcium: 8.5 mg/dL — ABNORMAL LOW (ref 8.9–10.3)
Chloride: 102 mmol/L (ref 98–111)
Creatinine, Ser: 0.8 mg/dL (ref 0.61–1.24)
GFR calc Af Amer: >60 mL/min (ref >60)
GFR calc non Af Amer: >60 mL/min (ref >60)
Glucose, Bld: 122 mg/dL — ABNORMAL HIGH (ref 70–99)
Potassium: 3.7 mmol/L (ref 3.5–5.1)
Sodium: 138 mmol/L (ref 135–145)

## 2018-11-21 LAB — CBC
HCT: 42.9 % (ref 39.0–52.0)
Hemoglobin: 13.7 g/dL (ref 13.0–17.0)
MCH: 29.6 pg (ref 26.0–34.0)
MCHC: 31.9 g/dL (ref 30.0–36.0)
MCV: 92.7 fL (ref 80.0–100.0)
Platelets: 196 10*3/uL (ref 150–400)
RBC: 4.63 MIL/uL (ref 4.22–5.81)
RDW: 13.6 % (ref 11.5–15.5)
WBC: 16.8 10*3/uL — ABNORMAL HIGH (ref 4.0–10.5)
nRBC: 0 % (ref 0.0–0.2)

## 2018-11-21 MED ORDER — RIVAROXABAN 10 MG PO TABS: 10.0000 mg | ORAL_TABLET | Freq: Every day | ORAL | 0 refills | Status: DC

## 2018-11-21 MED ORDER — METHOCARBAMOL 500 MG PO TABS: 500.0000 mg | ORAL_TABLET | Freq: Four times a day (QID) | ORAL | 1 refills | Status: DC | PRN

## 2018-11-21 MED ORDER — HYDROCODONE-ACETAMINOPHEN 5-325 MG PO TABS: 1.0000 | ORAL_TABLET | Freq: Four times a day (QID) | ORAL | 0 refills | Status: DC | PRN

## 2018-11-21 NOTE — Progress Notes (Signed)
Physical Therapy Treatment Patient Details Name: Carl Humphrey MRN: [id number] DOB: 27-Oct-1955 Today's Date: 11/21/2018    History of Present Illness s/p R DA THA    PT Comments    Pt continues cooperative and progressing with mobility.  Pt reviewed car transfers, stairs and home therex program with written instruction and progression provided.   Follow Up Recommendations  Follow surgeon's recommendation for DC plan and follow-up therapies     Equipment Recommendations  None recommended by PT    Recommendations for Other Services       Precautions / Restrictions Precautions Precautions: Fall Restrictions Weight Bearing Restrictions: No Other Position/Activity Restrictions: WBAT    Mobility  Bed Mobility               General bed mobility comments: Pt up in chair and requests back to same  Transfers Overall transfer level: Needs assistance Equipment used: Rolling walker (2 wheeled) Transfers: Sit to/from Stand Sit to Stand: Min guard;Supervision         General transfer comment: cues for hand placement and RLE position  Ambulation/Gait Ambulation/Gait assistance: Min guard;Supervision Gait Distance (Feet): 100 Feet Assistive device: Rolling walker (2 wheeled) Gait Pattern/deviations: Step-to pattern;Decreased weight shift to right;Wide base of support Gait velocity: decr   General Gait Details: cues for RW position,  sequence   Stairs Stairs: Yes Stairs assistance: Min assist Stair Management: No rails;Step to pattern;Backwards;With walker Number of Stairs: 4 General stair comments: 2 steps twice with RW bkwd - cues for sequence and foot/RW placement - written instructions provided   Wheelchair Mobility    Modified Rankin (Stroke Patients Only)       Balance Overall balance assessment: Needs assistance Sitting-balance support: No upper extremity supported;Feet supported Sitting balance-Leahy Scale: Good     Standing balance support:  Bilateral upper extremity supported Standing balance-Leahy Scale: Fair                              Cognition Arousal/Alertness: Awake/alert Behavior During Therapy: WFL for tasks assessed/performed Overall Cognitive Status: Within Functional Limits for tasks assessed                                        Exercises Total Joint Exercises Ankle Circles/Pumps: AROM;15 reps;Both;Supine Quad Sets: AROM;Both;10 reps;Supine Heel Slides: AAROM;Right;20 reps;Supine Hip ABduction/ADduction: AAROM;Right;15 reps;Supine Long Arc Quad: AROM;Right;10 reps;Seated    General Comments        Pertinent Vitals/Pain Pain Assessment: 0-10 Pain Score: 4  Pain Location: right hip Pain Descriptors / Indicators: Aching;Sore Pain Intervention(s): Limited activity within patient's tolerance;Monitored during session;Premedicated before session;Ice applied    Home Living                      Prior Function            PT Goals (current goals can now be found in the care plan section) Acute Rehab PT Goals PT Goal Formulation: With patient Time For Goal Achievement: 12/10/18 Potential to Achieve Goals: Good Progress towards PT goals: Progressing toward goals    Frequency    7X/week      PT Plan Current plan remains appropriate    Co-evaluation              AM-PAC PT "6 Clicks" Mobility   Outcome Measure  Help needed turning from  your back to your side while in a flat bed without using bedrails?: A Little Help needed moving from lying on your back to sitting on the side of a flat bed without using bedrails?: A Little Help needed moving to and from a bed to a chair (including a wheelchair)?: A Little Help needed standing up from a chair using your arms (e.g., wheelchair or bedside chair)?: A Little Help needed to walk in hospital room?: A Little Help needed climbing 3-5 steps with a railing? : A Little 6 Click Score: 18    End of Session  Equipment Utilized During Treatment: Gait belt Activity Tolerance: Patient tolerated treatment well Patient left: in chair;with call bell/phone within reach;with chair alarm set Nurse Communication: Mobility status PT Visit Diagnosis: Difficulty in walking, not elsewhere classified (R26.2)     Time: 4536-4680 PT Time Calculation (min) (ACUTE ONLY): 38 min  Charges:  $Gait Training: 8-22 mins $Therapeutic Exercise: 8-22 mins $Therapeutic Activity: 8-22 mins                     [place] Pager [phone] Office [phone]    Simranjit Thayer 11/21/2018, 3:08 PM

## 2018-11-21 NOTE — Progress Notes (Signed)
   Subjective: 1 Day Post-Op Procedure(s) (LRB): TOTAL HIP ARTHROPLASTY ANTERIOR APPROACH (Right) Patient reports pain as mild.   Plan is to go Home after hospital stay.  Objective: Vital signs in last 24 hours: Temp:  [97.4 F (36.3 C)-98.6 F (37 C)] 97.8 F (36.6 C) (07/02 0437) Pulse Rate:  [62-75] 62 (07/02 0437) Resp:  [7-16] 16 (07/02 0437) BP: (120-160)/(68-108) 131/108 (07/02 0437) SpO2:  [96 %-100 %] 99 % (07/02 0437) Weight:  [106 kg-106.6 kg] 106 kg (07/01 1645)  Intake/Output from previous day:  Intake/Output Summary (Last 24 hours) at 11/21/2018 0708 Last data filed at 11/21/2018 0600 Gross per 24 hour  Intake 5257.68 ml  Output 2415 ml  Net 2842.68 ml    Intake/Output this shift: No intake/output data recorded.  Labs: Recent Labs    11/21/18 0250  HGB 13.7   Recent Labs    11/21/18 0250  WBC 16.8*  RBC 4.63  HCT 42.9  PLT 196   Recent Labs    11/21/18 0250  NA 138  K 3.7  CL 102  CO2 26  BUN 12  CREATININE 0.80  GLUCOSE 122*  CALCIUM 8.5*   No results for input(s): LABPT, INR in the last 72 hours.  EXAM General - Patient is Alert, Appropriate and Oriented Extremity - Neurologically intact Neurovascular intact No cellulitis present Compartment soft Dressing - dressing C/D/I Motor Function - intact, moving foot and toes well on exam.    Past Medical History:  Diagnosis Date  . Anxiety   . Arthritis   . CAD (coronary artery disease)    a. nonobstructive by CT 2019.  [name] Chronic diastolic CHF (congestive heart failure) ([place])   . Dysrhythmia   . ETOH abuse   . Former tobacco use   . GERD (gastroesophageal reflux disease)   . Hypertensive heart disease   . Hypertriglyceridemia   . Hypothyroidism   . Obesity   . Obstructive sleep apnea    a. On CPAP x ~ 10 yrs.  [name] PAF (paroxysmal atrial fibrillation) ([place])    a. Dx 08/13/2015, without recurrence.  . Pneumonia     Assessment/Plan: 1 Day Post-Op Procedure(s) (LRB): TOTAL HIP  ARTHROPLASTY ANTERIOR APPROACH (Right) Principal Problem:   OA (osteoarthritis) of hip   Advance diet Up with therapy  Discharge home this afternoon after PT  DVT Prophylaxis - Aspirin Weight Bearing As Tolerated right Leg   Carl Humphrey

## 2018-11-21 NOTE — Progress Notes (Signed)
Physical Therapy Treatment Patient Details Name: Hilario Robarts MRN: [id number] DOB: 1956/03/16 Today's Date: 11/21/2018    History of Present Illness s/p R DA THA    PT Comments    Pt requiring increased time but progressing well with mobility.  Pt c/o mild dizziness with ambulation - BP 137/82 - RN aware  Follow Up Recommendations  Follow surgeon's recommendation for DC plan and follow-up therapies     Equipment Recommendations  None recommended by PT    Recommendations for Other Services       Precautions / Restrictions Precautions Precautions: Fall Restrictions Weight Bearing Restrictions: No Other Position/Activity Restrictions: WBAT    Mobility  Bed Mobility               General bed mobility comments: Pt up in chair and requests back to same  Transfers Overall transfer level: Needs assistance Equipment used: Rolling walker (2 wheeled) Transfers: Sit to/from Stand Sit to Stand: Min assist;Min guard         General transfer comment: cues for hand placement and RLE position  Ambulation/Gait Ambulation/Gait assistance: Min guard Gait Distance (Feet): 140 Feet Assistive device: Rolling walker (2 wheeled) Gait Pattern/deviations: Step-to pattern;Decreased weight shift to right;Wide base of support Gait velocity: decr   General Gait Details: cues for RW position,  sequence   Stairs             Wheelchair Mobility    Modified Rankin (Stroke Patients Only)       Balance Overall balance assessment: Needs assistance Sitting-balance support: No upper extremity supported;Feet supported Sitting balance-Leahy Scale: Good     Standing balance support: Bilateral upper extremity supported Standing balance-Leahy Scale: Fair                              Cognition Arousal/Alertness: Awake/alert Behavior During Therapy: WFL for tasks assessed/performed Overall Cognitive Status: Within Functional Limits for tasks assessed                                         Exercises Total Joint Exercises Ankle Circles/Pumps: AROM;15 reps;Both;Supine Quad Sets: AROM;Both;10 reps;Supine Heel Slides: AAROM;Right;20 reps;Supine Hip ABduction/ADduction: AAROM;Right;15 reps;Supine Long Arc Quad: AROM;Right;10 reps;Seated    General Comments        Pertinent Vitals/Pain Pain Assessment: 0-10 Pain Score: 5  Pain Location: right hip Pain Descriptors / Indicators: Aching;Sore Pain Intervention(s): Limited activity within patient's tolerance;Monitored during session;Premedicated before session;Ice applied    Home Living                      Prior Function            PT Goals (current goals can now be found in the care plan section) Acute Rehab PT Goals PT Goal Formulation: With patient Time For Goal Achievement: 12/10/18 Potential to Achieve Goals: Good Progress towards PT goals: Progressing toward goals    Frequency    7X/week      PT Plan Current plan remains appropriate    Co-evaluation              AM-PAC PT "6 Clicks" Mobility   Outcome Measure  Help needed turning from your back to your side while in a flat bed without using bedrails?: A Little Help needed moving from lying on your back to sitting on the side  of a flat bed without using bedrails?: A Little Help needed moving to and from a bed to a chair (including a wheelchair)?: A Little Help needed standing up from a chair using your arms (e.g., wheelchair or bedside chair)?: A Little Help needed to walk in hospital room?: A Little Help needed climbing 3-5 steps with a railing? : A Little 6 Click Score: 18    End of Session Equipment Utilized During Treatment: Gait belt Activity Tolerance: Patient tolerated treatment well Patient left: in chair;with call bell/phone within reach;with chair alarm set Nurse Communication: Mobility status PT Visit Diagnosis: Difficulty in walking, not elsewhere classified (R26.2)      Time: 2536-6440 PT Time Calculation (min) (ACUTE ONLY): 53 min  Charges:  $Gait Training: 23-37 mins $Therapeutic Exercise: 8-22 mins                     [place] Pager [phone] Office [phone]    Zaveon Gillen 11/21/2018, 12:36 PM

## 2018-11-22 NOTE — Progress Notes (Signed)
RN reviewed discharge instructions with patient and family. All questions answered.   Paperwork and prescriptions given.   NT rolled patient down with all belongings to family car. 

## 2018-11-22 NOTE — Plan of Care (Signed)
Education completed 11/21/18

## 2018-11-24 NOTE — Discharge Summary (Signed)
Physician Discharge Summary   Patient ID: Carl Humphrey MRN: [id number] DOB/AGE: 12/10/1955 63 y.o.  Admit date: 11/20/2018 Discharge date: 11/21/2018  Primary Diagnosis: primary osteoarthritis right hip   Admission Diagnoses:  Past Medical History:  Diagnosis Date   Anxiety    Arthritis    CAD (coronary artery disease)    a. nonobstructive by CT 2019.   Chronic diastolic CHF (congestive heart failure) (HCC)    Dysrhythmia    ETOH abuse    Former tobacco use    GERD (gastroesophageal reflux disease)    Hypertensive heart disease    Hypertriglyceridemia    Hypothyroidism    Obesity    Obstructive sleep apnea    a. On CPAP x ~ 10 yrs.   PAF (paroxysmal atrial fibrillation) ([place])    a. Dx 08/13/2015, without recurrence.   Pneumonia    Discharge Diagnoses:   Principal Problem:   OA (osteoarthritis) of hip  Estimated body mass index is 36.6 kg/m as calculated from the following:   Height as of this encounter: 5\' 7"  (1.702 m).   Weight as of this encounter: 106 kg.  Procedure(s) (LRB): TOTAL HIP ARTHROPLASTY ANTERIOR APPROACH (Right)   Consults: None  HPI: Carl Humphrey, 63 y.o. male, has a history of pain and functional disability in the right hip(s) due to arthritis and patient has failed non-surgical conservative treatments for greater than 12 weeks to include NSAID's and/or analgesics, corticosteriod injections and activity modification.  Onset of symptoms was gradual starting 3 years ago with gradually worsening course since that time.The patient noted no past surgery on the right hip(s).  Patient currently rates pain in the right hip at 6 out of 10 with activity. Patient has night pain, worsening of pain with activity and weight bearing, pain that interfers with activities of daily living and pain with passive range of motion. Patient has evidence of subchondral cysts, periarticular osteophytes and joint space narrowing by imaging studies. This condition  presents safety issues increasing the risk of falls.  There is no current active infection.  Laboratory Data: Admission on 11/20/2018, Discharged on 11/21/2018  Component Date Value Ref Range Status   WBC 11/21/2018 16.8* 4.0 - 10.5 K/uL Final   RBC 11/21/2018 4.63  4.22 - 5.81 MIL/uL Final   Hemoglobin 11/21/2018 13.7  13.0 - 17.0 g/dL Final   HCT 11/21/2018 42.9  39.0 - 52.0 % Final   MCV 11/21/2018 92.7  80.0 - 100.0 fL Final   MCH 11/21/2018 29.6  26.0 - 34.0 pg Final   MCHC 11/21/2018 31.9  30.0 - 36.0 g/dL Final   RDW 11/21/2018 13.6  11.5 - 15.5 % Final   Platelets 11/21/2018 196  150 - 400 K/uL Final   nRBC 11/21/2018 0.0  0.0 - 0.2 % Final   Performed at [facility], [place] [address], [place], [place] [address]   Sodium 11/21/2018 138  135 - 145 mmol/L Final   Potassium 11/21/2018 3.7  3.5 - 5.1 mmol/L Final   Chloride 11/21/2018 102  98 - 111 mmol/L Final   CO2 11/21/2018 26  22 - 32 mmol/L Final   Glucose, Bld 11/21/2018 122* 70 - 99 mg/dL Final   BUN 11/21/2018 12  8 - 23 mg/dL Final   Creatinine, Ser 11/21/2018 0.80  0.61 - 1.24 mg/dL Final   Calcium 11/21/2018 8.5* 8.9 - 10.3 mg/dL Final   GFR calc non Af Amer 11/21/2018 >60  >60 mL/min Final   GFR calc Af Amer 11/21/2018 >  60  >60 mL/min Final   Anion gap 11/21/2018 10  5 - 15 Final   Performed at [facility], [place] [address], [place], [place] [address]  Hospital Outpatient Visit on 11/16/2018  Component Date Value Ref Range Status   SARS Coronavirus 2 11/16/2018 NEGATIVE  NEGATIVE Final   Comment: (NOTE) SARS-CoV-2 target nucleic acids are NOT DETECTED. The SARS-CoV-2 RNA is generally detectable in upper and lower respiratory specimens during the acute phase of infection. Negative results do not preclude SARS-CoV-2 infection, do not rule out co-infections with other pathogens, and should not be used as the sole basis for treatment or other patient  management decisions. Negative results must be combined with clinical observations, patient history, and epidemiological information. The expected result is Negative. Fact Sheet for Patients: SugarRoll.be Fact Sheet for Healthcare Providers: https://www.woods-mathews.com/ This test is not yet approved or cleared by the [place] FDA and  has been authorized for detection and/or diagnosis of SARS-CoV-2 by FDA under an Emergency Use Authorization (EUA). This EUA will remain  in effect (meaning this test can be used) for the duration of the COVID-19 declaration under Section 56                          4(b)(1) of the Act, 21 U.S.C. section 360bbb-3(b)(1), unless the authorization is terminated or revoked sooner. Performed at [facility], [place] [address], [place], [place] [address]   Hospital Outpatient Visit on 11/13/2018  Component Date Value Ref Range Status   aPTT 11/13/2018 31  24 - 36 seconds Final   Performed at [facility], [place] [address] [place], [place] [address]   WBC 11/13/2018 10.7* 4.0 - 10.5 K/uL Final   RBC 11/13/2018 5.22  4.22 - 5.81 MIL/uL Final   Hemoglobin 11/13/2018 15.4  13.0 - 17.0 g/dL Final   HCT 11/13/2018 47.2  39.0 - 52.0 % Final   MCV 11/13/2018 90.4  80.0 - 100.0 fL Final   MCH 11/13/2018 29.5  26.0 - 34.0 pg Final   MCHC 11/13/2018 32.6  30.0 - 36.0 g/dL Final   RDW 11/13/2018 13.7  11.5 - 15.5 % Final   Platelets 11/13/2018 231  150 - 400 K/uL Final   nRBC 11/13/2018 0.0  0.0 - 0.2 % Final   Performed at [facility], [place] [address] [place], [place] [address]   Sodium 11/13/2018 141  135 - 145 mmol/L Final   Potassium 11/13/2018 4.4  3.5 - 5.1 mmol/L Final   Chloride 11/13/2018 105  98 - 111 mmol/L Final   CO2 11/13/2018 27  22 - 32 mmol/L Final   Glucose, Bld 11/13/2018 107* 70 - 99 mg/dL Final   BUN 11/13/2018 21  8 - 23 mg/dL Final    Creatinine, Ser 11/13/2018 0.97  0.61 - 1.24 mg/dL Final   Calcium 11/13/2018 9.1  8.9 - 10.3 mg/dL Final   Total Protein 11/13/2018 6.7  6.5 - 8.1 g/dL Final   Albumin 11/13/2018 3.9  3.5 - 5.0 g/dL Final   AST 11/13/2018 33  15 - 41 U/L Final   ALT 11/13/2018 22  0 - 44 U/L Final   Alkaline Phosphatase 11/13/2018 42  38 - 126 U/L Final   Total Bilirubin 11/13/2018 0.5  0.3 - 1.2 mg/dL Final   GFR calc non Af Amer 11/13/2018 >60  >60 mL/min Final   GFR calc Af Amer 11/13/2018 >60  >60 mL/min  Final   Anion gap 11/13/2018 9  5 - 15 Final   Performed at [facility], [place] [address], [place], [place] [address]   Prothrombin Time 11/13/2018 14.8  11.4 - 15.2 seconds Final   INR 11/13/2018 1.2  0.8 - 1.2 Final   Comment: (NOTE) INR goal varies based on device and disease states. Performed at [facility], [place] [address] [place], [place] [address]    ABO/RH(D) 11/13/2018 A NEG   Final   Antibody Screen 11/13/2018 NEG   Final   Sample Expiration 11/13/2018 11/23/2018,2359   Final   Extend sample reason 11/13/2018    Final                   Value:NO TRANSFUSIONS OR PREGNANCY IN THE PAST 3 MONTHS Performed at [place] [address] [place], [place] [address]    MRSA, PCR 11/13/2018 NEGATIVE  NEGATIVE Final   Staphylococcus aureus 11/13/2018 NEGATIVE  NEGATIVE Final   Comment: (NOTE) The Xpert SA Assay (FDA approved for NASAL specimens in patients 23 years of age and older), is one component of a comprehensive surveillance program. It is not intended to diagnose infection nor to guide or monitor treatment. Performed at [facility], [place] [address] [place] [address], [place], [place] [address]    ABO/RH(D) 11/13/2018    Final                   Value:A NEG Performed at [facility], [place] [address] [place], [place] [address]      X-Rays:Dg Pelvis Portable  Result Date:  11/20/2018 CLINICAL DATA:  63 year old male status post right hip replacement today. EXAM: PORTABLE PELVIS 1-2 VIEWS COMPARISON:  Intraoperative images 1338 hours. FINDINGS: Portable AP supine view at 1509 hours. Bipolar type right hip arthroplasty. Hardware appears intact and normally aligned in the AP view. There is a postoperative drain in place. No unexpected osseous changes. Visible pelvis intact. IMPRESSION: Right hip arthroplasty with no adverse features. Electronically Signed   By: Genevie Ann M.D.   On: 11/20/2018 15:31   Dg C-arm 1-60 Min-no Report  Result Date: 11/20/2018 Fluoroscopy was utilized by the requesting physician.  No radiographic interpretation.   Dg Hip Operative Unilat With Pelvis Right  Result Date: 11/20/2018 CLINICAL DATA:  Status post right hip arthroplasty. EXAM: OPERATIVE right HIP (WITH PELVIS IF PERFORMED) 2 VIEWS TECHNIQUE: Fluoroscopic spot image(s) were submitted for interpretation post-operatively. Radiation exposure index: 1.84 mGy. COMPARISON:  Radiographs of December 14, 2017. FINDINGS: Two intraoperative fluoroscopic images demonstrate the right femoral and acetabular components to be well situated. Expected postoperative changes are seen in the surrounding soft tissues. IMPRESSION: Fluoroscopic guidance provided during right total hip arthroplasty. Electronically Signed   By: Marijo Conception M.D.   On: 11/20/2018 14:54    EKG: Orders placed or performed in visit on 10/16/18   EKG 12-Lead     Hospital Course: Patient was admitted to [facility] and taken to the OR and underwent the above state procedure without complications.  Patient tolerated the procedure well and was later transferred to the recovery room and then to the orthopaedic floor for postoperative care.  They were given PO and IV analgesics for pain control following their surgery.  They were given 24 hours of postoperative antibiotics of  Anti-infectives (From admission, onward)   Start      Dose/Rate Route Frequency Ordered Stop   11/20/18 1830  ceFAZolin (  ANCEF) IVPB 2g/100 mL premix     2 g 200 mL/hr over 30 Minutes Intravenous Every 6 hours 11/20/18 1700 11/21/18 0046   11/20/18 1100  ceFAZolin (ANCEF) IVPB 2g/100 mL premix     2 g 200 mL/hr over 30 Minutes Intravenous On call to O.R. 11/20/18 1050 11/20/18 1241     and started on DVT prophylaxis in the form of Xarelto.   PT and OT were ordered for total hip protocol.  The patient was allowed to be WBAT with therapy. Discharge planning was consulted to help with postop disposition and equipment needs.  Patient had a good night on the evening of surgery.  They started to get up OOB with therapy on day one.  Hemovac drain was pulled without difficulty.  The patient had progressed with therapy and meeting their goals.  Incision was healing well.  Patient was seen in rounds and was ready to go home.  Diet: Cardiac diet Activity:WBAT Follow-up:in 2 weeks Disposition - Home Discharged Condition: stable   Discharge Instructions    Call MD / Call 911   Complete by: As directed    If you experience chest pain or shortness of breath, CALL 911 and be transported to the hospital emergency room.  If you develope a fever above 101 F, pus (white drainage) or increased drainage or redness at the wound, or calf pain, call your surgeon's office.   Call MD / Call 911   Complete by: As directed    If you experience chest pain or shortness of breath, CALL 911 and be transported to the hospital emergency room.  If you develope a fever above 101 F, pus (white drainage) or increased drainage or redness at the wound, or calf pain, call your surgeon's office.   Change dressing   Complete by: As directed    You may change your dressing on Friday, then change the dressing daily with sterile 4 x 4 inch gauze dressing and paper tape.   Constipation Prevention   Complete by: As directed    Drink plenty of fluids.  Prune juice may be helpful.  You may  use a stool softener, such as Colace (over the counter) 100 mg twice a day.  Use MiraLax (over the counter) for constipation as needed.   Constipation Prevention   Complete by: As directed    Drink plenty of fluids.  Prune juice may be helpful.  You may use a stool softener, such as Colace (over the counter) 100 mg twice a day.  Use MiraLax (over the counter) for constipation as needed.   Diet - low sodium heart healthy   Complete by: As directed    Diet - low sodium heart healthy   Complete by: As directed    Discharge instructions   Complete by: As directed    Dr. [name] Total Joint Specialist [facility] [address], [place], [place] [address] [phone]  ANTERIOR APPROACH TOTAL HIP REPLACEMENT POSTOPERATIVE DIRECTIONS   Hip Rehabilitation, Guidelines Following Surgery  The results of a hip operation are greatly improved after range of motion and muscle strengthening exercises. Follow all safety measures which are given to protect your hip. If any of these exercises cause increased pain or swelling in your joint, decrease the amount until you are comfortable again. Then slowly increase the exercises. Call your caregiver if you have problems or questions.   HOME CARE INSTRUCTIONS  Remove items at home which could result in a fall. This includes  throw rugs or furniture in walking pathways.  ICE to the affected hip every three hours for 30 minutes at a time and then as needed for pain and swelling.  Continue to use ice on the hip for pain and swelling from surgery. You may notice swelling that will progress down to the foot and ankle.  This is normal after surgery.  Elevate the leg when you are not up walking on it.   Continue to use the breathing machine which will help keep your temperature down.  It is common for your temperature to cycle up and down following surgery, especially at night when you are not up moving around and exerting yourself.  The breathing  machine keeps your lungs expanded and your temperature down.  DIET You may resume your previous home diet once your are discharged from the hospital.  DRESSING / WOUND CARE / SHOWERING You may change your dressing 3-5 days after surgery.  Then change the dressing every day with sterile gauze.  Please use good hand washing techniques before changing the dressing.  Do not use any lotions or creams on the incision until instructed by your surgeon. You may start showering once you are discharged home but do not submerge the incision under water. Just pat the incision dry and apply a dry gauze dressing on daily. Change the surgical dressing daily and reapply a dry dressing each time.  ACTIVITY Walk with your walker as instructed. Use walker as long as suggested by your caregivers. Avoid periods of inactivity such as sitting longer than an hour when not asleep. This helps prevent blood clots.  You may resume a sexual relationship in one month or when given the OK by your doctor.  You may return to work once you are cleared by your doctor.  Do not drive a car for 6 weeks or until released by you surgeon.  Do not drive while taking narcotics.  WEIGHT BEARING Weight bearing as tolerated with assist device (walker, cane, etc) as directed, use it as long as suggested by your surgeon or therapist, typically at least 4-6 weeks.  POSTOPERATIVE CONSTIPATION PROTOCOL Constipation - defined medically as fewer than three stools per week and severe constipation as less than one stool per week.  One of the most common issues patients have following surgery is constipation.  Even if you have a regular bowel pattern at home, your normal regimen is likely to be disrupted due to multiple reasons following surgery.  Combination of anesthesia, postoperative narcotics, change in appetite and fluid intake all can affect your bowels.  In order to avoid complications following surgery, here are some recommendations in  order to help you during your recovery period.  Colace (docusate) - Pick up an over-the-counter form of Colace or another stool softener and take twice a day as long as you are requiring postoperative pain medications.  Take with a full glass of water daily.  If you experience loose stools or diarrhea, hold the colace until you stool forms back up.  If your symptoms do not get better within 1 week or if they get worse, check with your doctor.  Dulcolax (bisacodyl) - Pick up over-the-counter and take as directed by the product packaging as needed to assist with the movement of your bowels.  Take with a full glass of water.  Use this product as needed if not relieved by Colace only.   MiraLax (polyethylene glycol) - Pick up over-the-counter to have on hand.  MiraLax is a  solution that will increase the amount of water in your bowels to assist with bowel movements.  Take as directed and can mix with a glass of water, juice, soda, coffee, or tea.  Take if you go more than two days without a movement. Do not use MiraLax more than once per day. Call your doctor if you are still constipated or irregular after using this medication for 7 days in a row.  If you continue to have problems with postoperative constipation, please contact the office for further assistance and recommendations.  If you experience "the worst abdominal pain ever" or develop nausea or vomiting, please contact the office immediatly for further recommendations for treatment.  ITCHING  If you experience itching with your medications, try taking only a single pain pill, or even half a pain pill at a time.  You can also use Benadryl over the counter for itching or also to help with sleep.   TED HOSE STOCKINGS Wear the elastic stockings on both legs for three weeks following surgery during the day but you may remove then at night for sleeping.  MEDICATIONS See your medication summary on the "After Visit Summary" that the nursing staff will  review with you prior to discharge.  You may have some home medications which will be placed on hold until you complete the course of blood thinner medication.  It is important for you to complete the blood thinner medication as prescribed by your surgeon.  Continue your approved medications as instructed at time of discharge.  PRECAUTIONS If you experience chest pain or shortness of breath - call 911 immediately for transfer to the hospital emergency department.  If you develop a fever greater that 101 F, purulent drainage from wound, increased redness or drainage from wound, foul odor from the wound/dressing, or calf pain - CONTACT YOUR SURGEON.                                                   FOLLOW-UP APPOINTMENTS Make sure you keep all of your appointments after your operation with your surgeon and caregivers. You should call the office at the above phone number and make an appointment for approximately two weeks after the date of your surgery or on the date instructed by your surgeon outlined in the "After Visit Summary".  RANGE OF MOTION AND STRENGTHENING EXERCISES  These exercises are designed to help you keep full movement of your hip joint. Follow your caregiver's or physical therapist's instructions. Perform all exercises about fifteen times, three times per day or as directed. Exercise both hips, even if you have had only one joint replacement. These exercises can be done on a training (exercise) mat, on the floor, on a table or on a bed. Use whatever works the best and is most comfortable for you. Use music or television while you are exercising so that the exercises are a pleasant break in your day. This will make your life better with the exercises acting as a break in routine you can look forward to.  Lying on your back, slowly slide your foot toward your buttocks, raising your knee up off the floor. Then slowly slide your foot back down until your leg is straight again.  Lying on your  back spread your legs as far apart as you can without causing discomfort.  Lying on your  side, raise your upper leg and foot straight up from the floor as far as is comfortable. Slowly lower the leg and repeat.  Lying on your back, tighten up the muscle in the front of your thigh (quadriceps muscles). You can do this by keeping your leg straight and trying to raise your heel off the floor. This helps strengthen the largest muscle supporting your knee.  Lying on your back, tighten up the muscles of your buttocks both with the legs straight and with the knee bent at a comfortable angle while keeping your heel on the floor.   IF YOU ARE TRANSFERRED TO A SKILLED REHAB FACILITY If the patient is transferred to a skilled rehab facility following release from the hospital, a list of the current medications will be sent to the facility for the patient to continue.  When discharged from the skilled rehab facility, please have the facility set up the patient's [place] prior to being released. Also, the skilled facility will be responsible for providing the patient with their medications at time of release from the facility to include their pain medication, the muscle relaxants, and their blood thinner medication. If the patient is still at the rehab facility at time of the two week follow up appointment, the skilled rehab facility will also need to assist the patient in arranging follow up appointment in our office and any transportation needs.  MAKE SURE YOU:  Understand these instructions.  Get help right away if you are not doing well or get worse.    Pick up stool softner and laxative for home use following surgery while on pain medications. Do not submerge incision under water. Please use good hand washing techniques while changing dressing each day. May shower starting three days after surgery. Please use a clean towel to pat the incision dry following showers. Continue to use ice  for pain and swelling after surgery. Do not use any lotions or creams on the incision until instructed by your surgeon.   Discharge instructions   Complete by: As directed    Dr. [name] Total Joint Specialist [facility] [address], [place],  [address] [phone]  ANTERIOR APPROACH TOTAL HIP REPLACEMENT POSTOPERATIVE DIRECTIONS   Hip Rehabilitation, Guidelines Following Surgery  The results of a hip operation are greatly improved after range of motion and muscle strengthening exercises. Follow all safety measures which are given to protect your hip. If any of these exercises cause increased pain or swelling in your joint, decrease the amount until you are comfortable again. Then slowly increase the exercises. Call your caregiver if you have problems or questions.   HOME CARE INSTRUCTIONS  Remove items at home which could result in a fall. This includes throw rugs or furniture in walking pathways.  ICE to the affected hip every three hours for 30 minutes at a time and then as needed for pain and swelling.  Continue to use ice on the hip for pain and swelling from surgery. You may notice swelling that will progress down to the foot and ankle.  This is normal after surgery.  Elevate the leg when you are not up walking on it.   Continue to use the breathing machine which will help keep your temperature down.  It is common for your temperature to cycle up and down following surgery, especially at night when you are not up moving around and exerting yourself.  The breathing machine keeps your lungs expanded and your temperature  down.  DIET You may resume your previous home diet once your are discharged from the hospital.  DRESSING / WOUND CARE / SHOWERING You may change your dressing 3-5 days after surgery.  Then change the dressing every day with sterile gauze.  Please use good hand washing techniques before changing the dressing.  Do not use any lotions or creams  on the incision until instructed by your surgeon. You may start showering once you are discharged home but do not submerge the incision under water. Just pat the incision dry and apply a dry gauze dressing on daily. Change the surgical dressing daily and reapply a dry dressing each time.  ACTIVITY Walk with your walker as instructed. Use walker as long as suggested by your caregivers. Avoid periods of inactivity such as sitting longer than an hour when not asleep. This helps prevent blood clots.  You may resume a sexual relationship in one month or when given the OK by your doctor.  You may return to work once you are cleared by your doctor.  Do not drive a car for 6 weeks or until released by you surgeon.  Do not drive while taking narcotics.  WEIGHT BEARING Weight bearing as tolerated with assist device (walker, cane, etc) as directed, use it as long as suggested by your surgeon or therapist, typically at least 4-6 weeks.  POSTOPERATIVE CONSTIPATION PROTOCOL Constipation - defined medically as fewer than three stools per week and severe constipation as less than one stool per week.  One of the most common issues patients have following surgery is constipation.  Even if you have a regular bowel pattern at home, your normal regimen is likely to be disrupted due to multiple reasons following surgery.  Combination of anesthesia, postoperative narcotics, change in appetite and fluid intake all can affect your bowels.  In order to avoid complications following surgery, here are some recommendations in order to help you during your recovery period.  Colace (docusate) - Pick up an over-the-counter form of Colace or another stool softener and take twice a day as long as you are requiring postoperative pain medications.  Take with a full glass of water daily.  If you experience loose stools or diarrhea, hold the colace until you stool forms back up.  If your symptoms do not get better within 1 week or  if they get worse, check with your doctor.  Dulcolax (bisacodyl) - Pick up over-the-counter and take as directed by the product packaging as needed to assist with the movement of your bowels.  Take with a full glass of water.  Use this product as needed if not relieved by Colace only.   MiraLax (polyethylene glycol) - Pick up over-the-counter to have on hand.  MiraLax is a solution that will increase the amount of water in your bowels to assist with bowel movements.  Take as directed and can mix with a glass of water, juice, soda, coffee, or tea.  Take if you go more than two days without a movement. Do not use MiraLax more than once per day. Call your doctor if you are still constipated or irregular after using this medication for 7 days in a row.  If you continue to have problems with postoperative constipation, please contact the office for further assistance and recommendations.  If you experience "the worst abdominal pain ever" or develop nausea or vomiting, please contact the office immediatly for further recommendations for treatment.  ITCHING  If you experience itching with your medications, try  taking only a single pain pill, or even half a pain pill at a time.  You can also use Benadryl over the counter for itching or also to help with sleep.   TED HOSE STOCKINGS Wear the elastic stockings on both legs for three weeks following surgery during the day but you may remove then at night for sleeping.  MEDICATIONS See your medication summary on the "After Visit Summary" that the nursing staff will review with you prior to discharge.  You may have some home medications which will be placed on hold until you complete the course of blood thinner medication.  It is important for you to complete the blood thinner medication as prescribed by your surgeon.  Continue your approved medications as instructed at time of discharge.  PRECAUTIONS If you experience chest pain or shortness of breath - call  [phone] immediately for transfer to the hospital emergency department.  If you develop a fever greater that 101 F, purulent drainage from wound, increased redness or drainage from wound, foul odor from the wound/dressing, or calf pain - CONTACT YOUR SURGEON.                                                   FOLLOW-UP APPOINTMENTS Make sure you keep all of your appointments after your operation with your surgeon and caregivers. You should call the office at the above phone number and make an appointment for approximately two weeks after the date of your surgery or on the date instructed by your surgeon outlined in the "After Visit Summary".  RANGE OF MOTION AND STRENGTHENING EXERCISES  These exercises are designed to help you keep full movement of your hip joint. Follow your caregiver's or physical therapist's instructions. Perform all exercises about fifteen times, three times per day or as directed. Exercise both hips, even if you have had only one joint replacement. These exercises can be done on a training (exercise) mat, on the floor, on a table or on a bed. Use whatever works the best and is most comfortable for you. Use music or television while you are exercising so that the exercises are a pleasant break in your day. This will make your life better with the exercises acting as a break in routine you can look forward to.  Lying on your back, slowly slide your foot toward your buttocks, raising your knee up off the floor. Then slowly slide your foot back down until your leg is straight again.  Lying on your back spread your legs as far apart as you can without causing discomfort.  Lying on your side, raise your upper leg and foot straight up from the floor as far as is comfortable. Slowly lower the leg and repeat.  Lying on your back, tighten up the muscle in the front of your thigh (quadriceps muscles). You can do this by keeping your leg straight and trying to raise your heel off the floor. This helps  strengthen the largest muscle supporting your knee.  Lying on your back, tighten up the muscles of your buttocks both with the legs straight and with the knee bent at a comfortable angle while keeping your heel on the floor.   IF YOU ARE TRANSFERRED TO A SKILLED REHAB FACILITY If the patient is transferred to a skilled rehab facility following release from the hospital, a list  of the current medications will be sent to the facility for the patient to continue.  When discharged from the skilled rehab facility, please have the facility set up the patient's [place] prior to being released. Also, the skilled facility will be responsible for providing the patient with their medications at time of release from the facility to include their pain medication, the muscle relaxants, and their blood thinner medication. If the patient is still at the rehab facility at time of the two week follow up appointment, the skilled rehab facility will also need to assist the patient in arranging follow up appointment in our office and any transportation needs.  MAKE SURE YOU:  Understand these instructions.  Get help right away if you are not doing well or get worse.    Pick up stool softner and laxative for home use following surgery while on pain medications. Do not submerge incision under water. Please use good hand washing techniques while changing dressing each day. May shower starting three days after surgery. Please use a clean towel to pat the incision dry following showers. Continue to use ice for pain and swelling after surgery. Do not use any lotions or creams on the incision until instructed by your surgeon.   Do not sit on low chairs, stoools or toilet seats, as it may be difficult to get up from low surfaces   Complete by: As directed    Driving restrictions   Complete by: As directed    No driving for two weeks   Increase activity slowly as tolerated   Complete by: As directed      TED hose   Complete by: As directed    Use stockings (TED hose) for three weeks on both leg(s).  You may remove them at night for sleeping.   Weight bearing as tolerated   Complete by: As directed      Allergies as of 11/21/2018      Reactions   Pravastatin Other (See Comments)   Reports causes muscle aches and joint pain   Rosuvastatin Other (See Comments)   Pt reports causes muscle/joint pain      Medication List    STOP taking these medications   aspirin EC 81 MG tablet   Fish Oil 2595 MG Caps   folic acid 1 MG tablet Commonly known as: FOLVITE   MULTIVITAMIN PO   VITAMIN D PO     TAKE these medications   ALPRAZolam 0.5 MG tablet Commonly known as: XANAX Take 0.25-0.5 mg by mouth daily as needed for anxiety.   Evolocumab 140 MG/ML Soaj Commonly known as: Repatha SureClick Inject 638 mg into the skin every 14 (fourteen) days.   fenofibrate 160 MG tablet Take 160 mg by mouth daily.   furosemide 20 MG tablet Commonly known as: Lasix Take 1 tablet (20 mg total) by mouth daily.   HYDROcodone-acetaminophen 5-325 MG tablet Commonly known as: NORCO/VICODIN Take 1-2 tablets by mouth every 6 (six) hours as needed for moderate pain (pain score 4-6).   levothyroxine 88 MCG tablet Commonly known as: SYNTHROID Take 88 mcg by mouth daily before breakfast.   methocarbamol 500 MG tablet Commonly known as: ROBAXIN Take 1 tablet (500 mg total) by mouth every 6 (six) hours as needed for muscle spasms.   metoprolol tartrate 50 MG tablet Commonly known as: LOPRESSOR TAKE 1 TABLET BY MOUTH TWICE A DAY   oxymetazoline 0.05 % nasal spray Commonly known as: AFRIN Place 1 spray into both  nostrils 2 (two) times daily. What changed:   when to take this  reasons to take this   rivaroxaban 10 MG Tabs tablet Commonly known as: XARELTO Take 1 tablet (10 mg total) by mouth daily with breakfast.   thiamine 100 MG tablet Take 1 tablet (100 mg total) by mouth daily.    valsartan 320 MG tablet Commonly known as: DIOVAN Take 320 mg by mouth daily.   venlafaxine XR 150 MG 24 hr capsule Commonly known as: EFFEXOR-XR Take 300 mg by mouth daily.   zolpidem 10 MG tablet Commonly known as: AMBIEN Take 10 mg by mouth at bedtime.            Discharge Care Instructions  (From admission, onward)         Start     Ordered   11/20/18 0000  Weight bearing as tolerated     11/20/18 1712   11/20/18 0000  Change dressing    Comments: You may change your dressing on Friday, then change the dressing daily with sterile 4 x 4 inch gauze dressing and paper tape.   11/20/18 1712         Follow-up Information    [name], MD. Schedule an appointment as soon as possible for a visit on 12/05/2018.   Specialty: Orthopedic Surgery Contact information: [address] [place] [address] [phone]           Signed: Ardeen Jourdain, PA-C Orthopaedic Surgery 11/24/2018, 8:42 PM

## 2018-12-06 ENCOUNTER — Other Ambulatory Visit: Payer: Self-pay | Admitting: Cardiology

## 2018-12-12 ENCOUNTER — Other Ambulatory Visit: Payer: Self-pay | Admitting: Cardiology

## 2018-12-18 ENCOUNTER — Other Ambulatory Visit: Payer: Self-pay

## 2018-12-18 DIAGNOSIS — Z20822 Contact with and (suspected) exposure to covid-19: Secondary | ICD-10-CM

## 2018-12-19 LAB — NOVEL CORONAVIRUS, NAA: SARS-CoV-2, NAA: NOT DETECTED

## 2019-01-14 ENCOUNTER — Other Ambulatory Visit: Payer: Self-pay | Admitting: Pharmacist

## 2019-01-14 MED ORDER — PRALUENT 75 MG/ML ~~LOC~~ SOAJ: 75.0000 mg | SUBCUTANEOUS | 6 refills | Status: DC

## 2019-01-16 ENCOUNTER — Telehealth: Payer: Self-pay

## 2019-01-16 MED ORDER — PRALUENT 150 MG/ML ~~LOC~~ SOAJ: 150.0000 mg | SUBCUTANEOUS | 6 refills | Status: DC

## 2019-01-16 NOTE — Addendum Note (Signed)
Addended by: Allean Found on: 01/16/2019 01:42 PM   Modules accepted: Orders

## 2019-01-16 NOTE — Telephone Encounter (Signed)
Called and lmomed the pt regarding the switch to praluent due to insurance and that we would offer a sample if need be to get him through until a pa that I am starting gets an approval

## 2019-01-17 NOTE — Telephone Encounter (Signed)
PA approved for Praleunt 150mg  until 12/2019 Rx verified with pharmacy and claim already processed.

## 2019-02-26 DIAGNOSIS — Z96641 Presence of right artificial hip joint: Secondary | ICD-10-CM | POA: Insufficient documentation

## 2019-04-20 IMAGING — CT CT ANGIO CHEST
2 of 7 series · 18 of 46 positions shown · IV contrast (iopamidol)
Comparison: Two-view chest x-ray of the same day.

CLINICAL DATA: Shortness of breath for 3 days.

EXAM:
CT ANGIOGRAPHY CHEST WITH CONTRAST
TECHNIQUE: Multidetector CT imaging of the chest was performed using the
standard protocol during bolus administration of intravenous
contrast. Multiplanar CT image reconstructions and MIPs were
obtained to evaluate the vascular anatomy.
CONTRAST:  100mL [id number] IOPAMIDOL ([id number]) INJECTION 76%

[Series 5: thins · axial · 0.76mm/px · z∈[-356,-72]mm · 15 of 322 slices shown]
[im 19/322  lung]
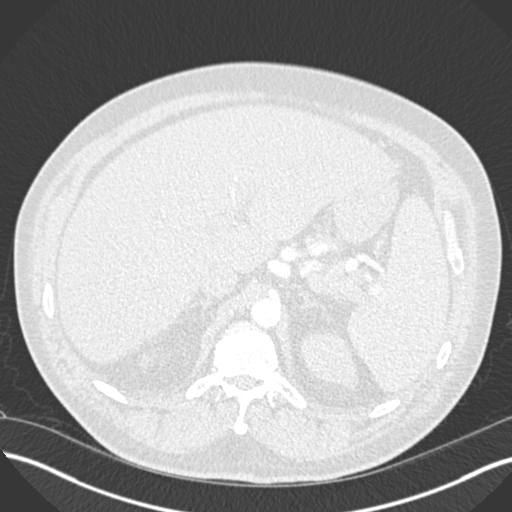
[im 38/322  soft-tissue]
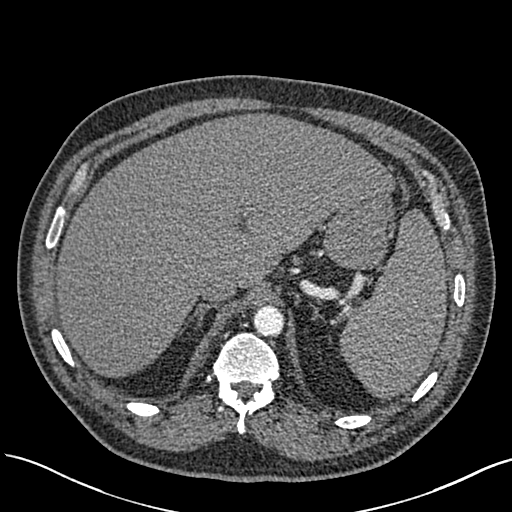
[im 57/322  lung]
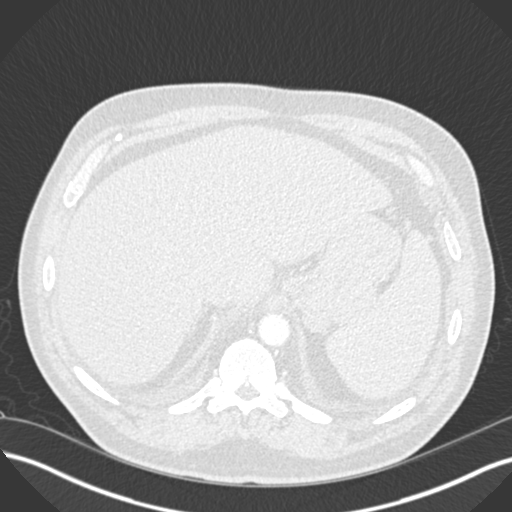
[im 76/322  soft-tissue]
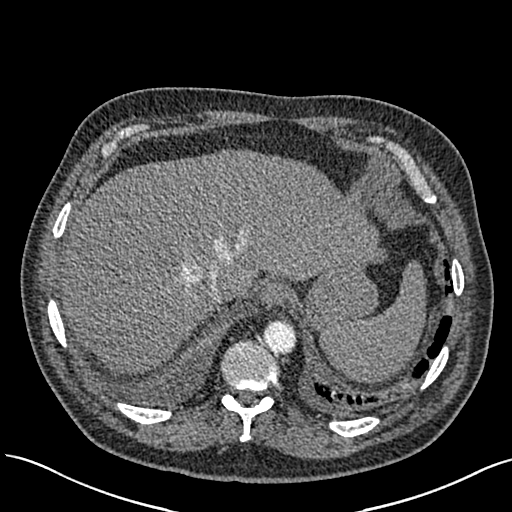
[im 95/322  lung]
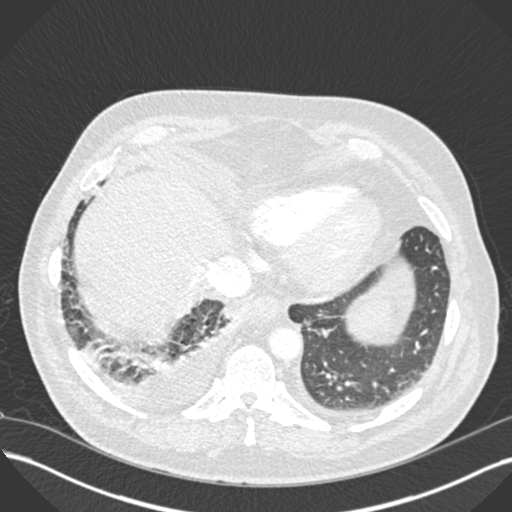
[im 114/322  soft-tissue]
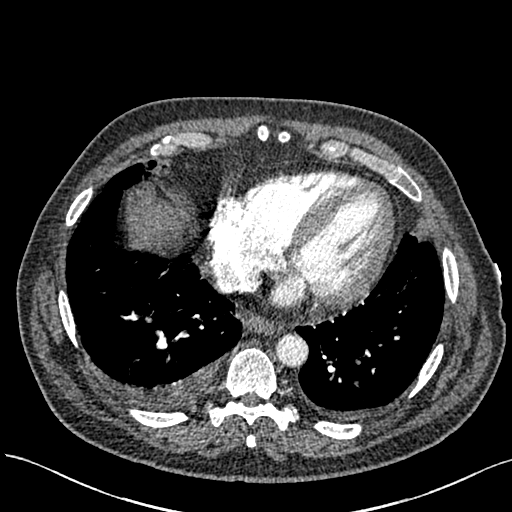
[im 133/322  lung]
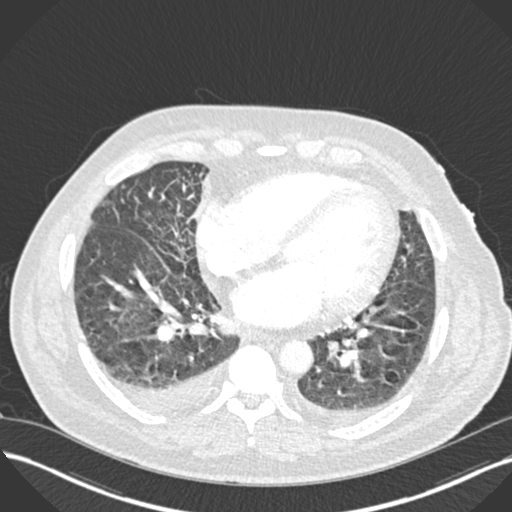
[im 170/322  soft-tissue]
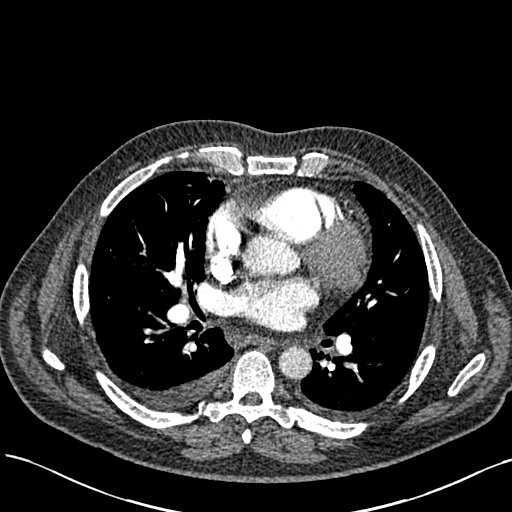
[im 189/322  lung]
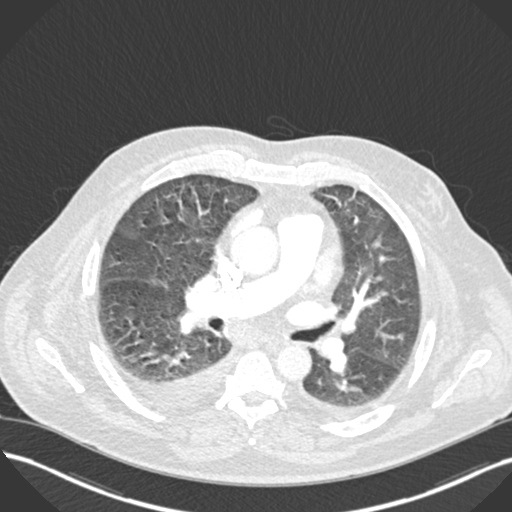
[im 208/322  soft-tissue]
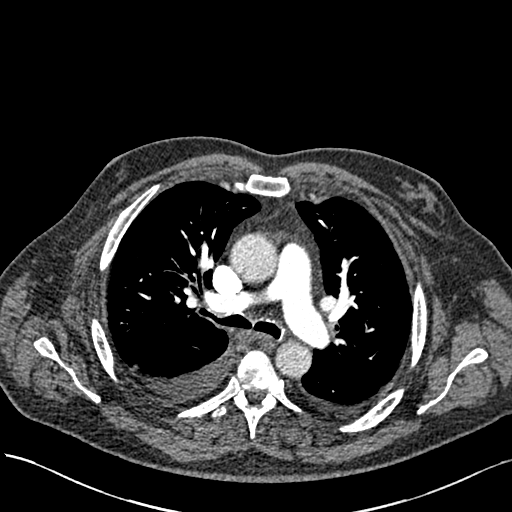
[im 227/322  lung]
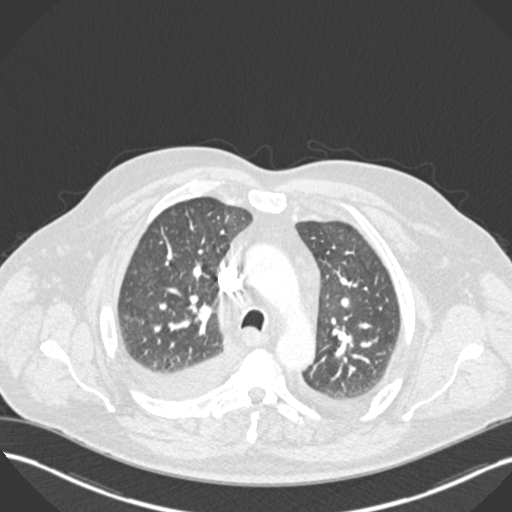
[im 246/322  soft-tissue]
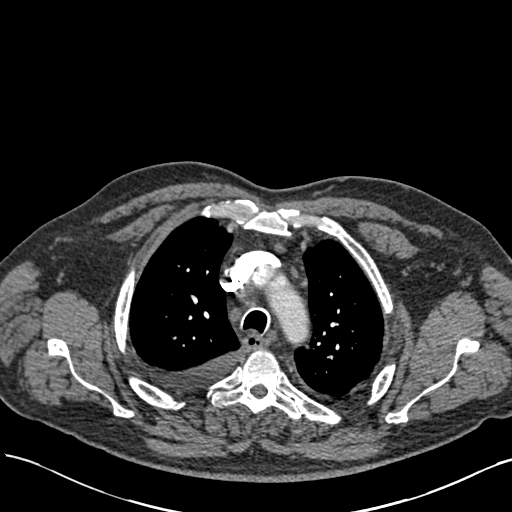
[im 265/322  lung]
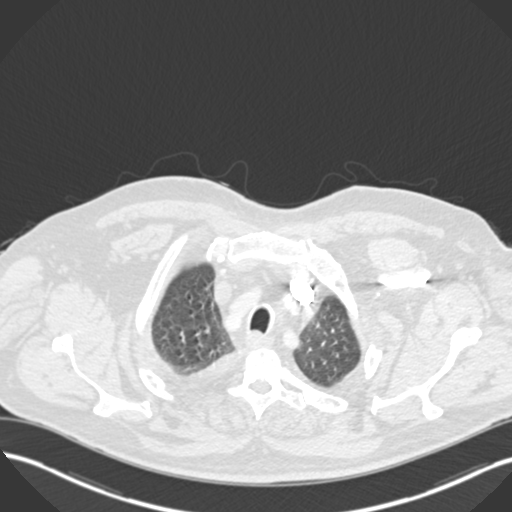
[im 284/322  soft-tissue]
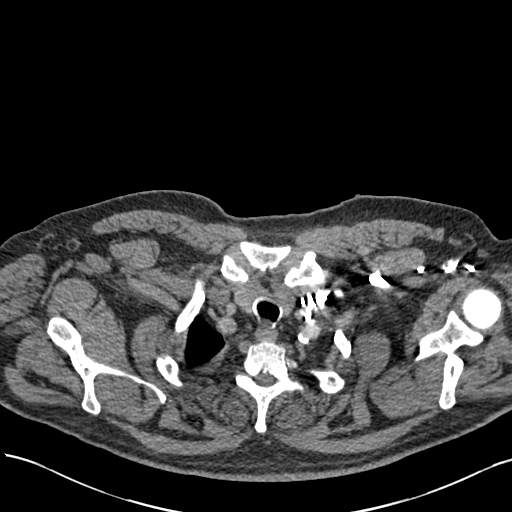
[im 303/322  lung]
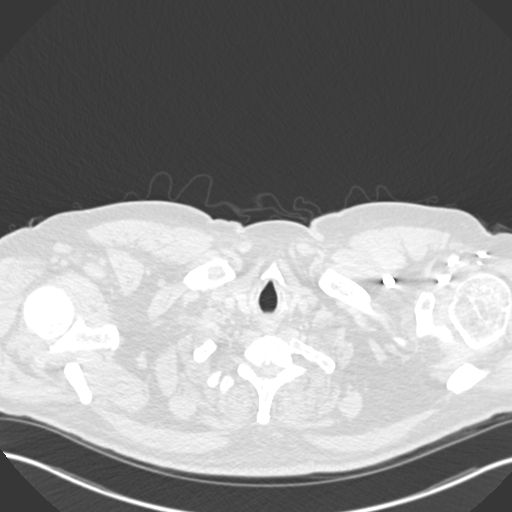

[Series 7: coronal mpr · coronal · 0.63mm/px · 3 of 166 slices shown]
[im 42/166  soft-tissue]
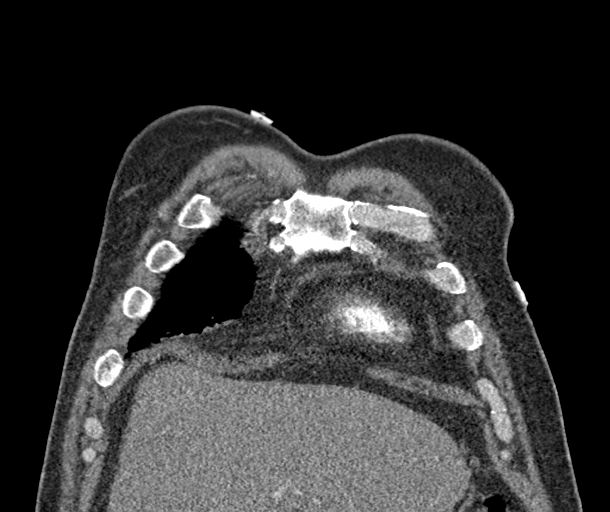
[im 83/166  soft-tissue]
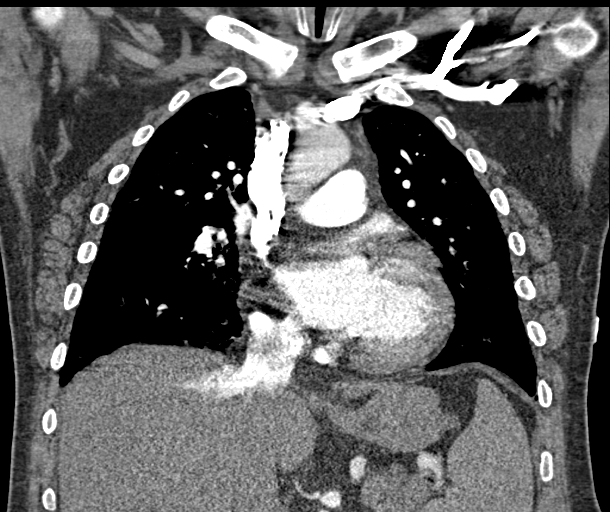
[im 124/166  soft-tissue]
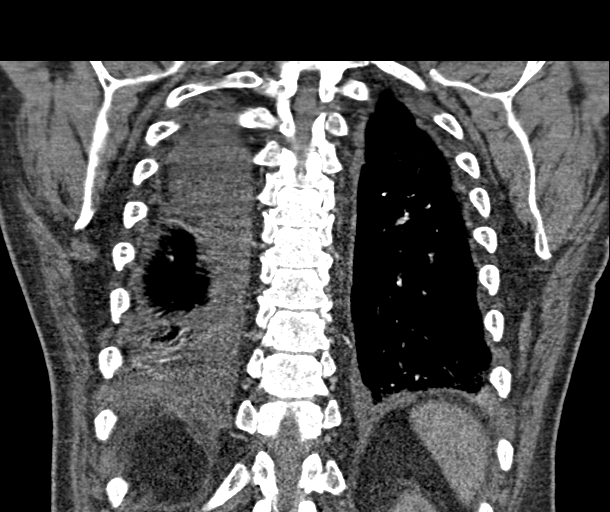

[18 of 46 positions shown; findings below may reference images not displayed]

FINDINGS: Cardiovascular: The heart size is normal. Coronary artery
calcifications are present. Atherosclerotic changes are noted at the
aortic arch. Great vessel origins are within normal limits.

Pulmonary artery opacification is satisfactory. There are no focal
filling defects to suggest pulmonary embolus.

Mediastinum/Nodes: No significant mediastinal, hilar, or axillary
adenopathy. Thoracic inlet is within normal limits. Visualized
thyroid is unremarkable.

Lungs/Pleura: Right greater than left pleural effusions are present.
There is mild dependent atelectasis. Ill-defined right middle lobe
airspace opacities are noted. Interlobular septal thickening is
present in the right middle and right lower lobes. Mild dependent
atelectasis is present on the left.

Upper Abdomen: Limited imaging of the upper abdomen is unremarkable.

Musculoskeletal: Vertebral body heights alignment are normal. No
focal lytic or blastic lesions are present. Sternum is within normal
limits. Ribs are unremarkable.

Review of the MIP images confirms the above findings.
IMPRESSION: 1. No pulmonary embolus.
2. Right greater than left pleural effusions.
3. Scattered right middle lobe airspace disease concerning for
infection.
4. Smaller left pleural effusion and mild atelectasis.
5. Mild interlobular septal thickening suggesting some degree of
edema.

## 2019-05-05 NOTE — Progress Notes (Signed)
Cardiology Office Note    Date:  05/06/2019   ID:  Carl Humphrey, DOB 1955/11/17, MRN [id number]  PCP:  [facility]  Cardiologist: Ena Dawley, MD EPS: None  No chief complaint on file.   History of Present Illness:  Carl Humphrey is a 63 y.o. male with history of PAF in 2017 without recurrence, nonobstructive CAD on coronary CTA 10/2017 70 percentile calcium score, 50 to 69% LAD with negative FFR, HTN, hypertriglyceridemia, chronic diastolic CHF, obesity, OSA on CPAP, heavy EtOH use.  Last echo 2019 LVEF 50% with grade 2 DD  Was last seen by Carl Copa, PA-C 10/16/2018 for surgical clearance before hip surgery.  Not on anticoagulation because of CHA2DS2-VASc equaling 1 in the past but now is 3.  She ordered a Zio patch to document any recurrent atrial fibrillation but this was never worn.   No chest pain, shortness of breath, dizziness, presyncope. Had some weight gain and doubled lasix for 2 weeks and lost 7 lbs but now has gained it back. Some  edema.Drinks 6-7 light beers/day. No regular exercise since hip surgery and covid19. Walks some. 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 Never wore monitor b/c patch kept falling off. He got a new one but never wore it.   Past Medical History:  Diagnosis Date  . Anxiety   . Arthritis   . CAD (coronary artery disease)    a. nonobstructive by CT 2019.  [name] Chronic diastolic CHF (congestive heart failure) ([place])   . Dysrhythmia   . ETOH abuse   . Former tobacco use   . GERD (gastroesophageal reflux disease)   . Hypertensive heart disease   . Hypertriglyceridemia   . Hypothyroidism   . Obesity   . Obstructive sleep apnea    a. On CPAP x ~ 10 yrs.  [name] PAF (paroxysmal atrial fibrillation) ([place])    a. Dx 08/13/2015, without recurrence.  . Pneumonia     Past Surgical History:  Procedure Laterality Date  . COLONOSCOPY    . NO PAST SURGERIES    . no prior surgery    . TOTAL HIP ARTHROPLASTY Right  11/20/2018   Procedure: TOTAL HIP ARTHROPLASTY ANTERIOR APPROACH;  Surgeon: Carl Arabian, MD;  Location: [facility];  Service: Orthopedics;  Laterality: Right;  145min    Current Medications: Current Meds  Medication Sig  . Alirocumab (PRALUENT) 150 MG/ML SOAJ Inject 150 mg into the skin every 14 (fourteen) days.  . ALPRAZolam (XANAX) 0.5 MG tablet Take 0.25-0.5 mg by mouth daily as needed for anxiety.   . fenofibrate 160 MG tablet Take 160 mg by mouth daily.   [name] levothyroxine (SYNTHROID, LEVOTHROID) 88 MCG tablet Take 88 mcg by mouth daily before breakfast.   . meloxicam (MOBIC) 15 MG tablet Take 15 mg by mouth daily.  . metoprolol tartrate (LOPRESSOR) 50 MG tablet TAKE 1 TABLET BY MOUTH TWICE A DAY  . oxymetazoline (AFRIN) 0.05 % nasal spray Place 1 spray into both nostrils 2 (two) times daily. (Patient taking differently: Place 1 spray into both nostrils 2 (two) times daily as needed for congestion. )  . pantoprazole (PROTONIX) 40 MG tablet pantoprazole 40 mg tablet,delayed release  TAKE 1 TABLET BY MOUTH EVERY DAY  . valsartan (DIOVAN) 320 MG tablet Take 320 mg by mouth daily.  [name] venlafaxine XR (EFFEXOR-XR) 150 MG 24 hr capsule Take 300 mg by mouth daily.   [name] zolpidem (AMBIEN) 10 MG tablet Take 10 mg by mouth at  bedtime.  . [DISCONTINUED] furosemide (LASIX) 20 MG tablet Take 1 tablet (20 mg total) by mouth daily.     Allergies:   Pravastatin and Rosuvastatin   Social History   Socioeconomic History  . Marital status: Married    Spouse name: Not on file  . Number of children: Not on file  . Years of education: Not on file  . Highest education level: Not on file  Occupational History  . Not on file  Tobacco Use  . Smoking status: Former Smoker    Quit date: 09/18/1997    Years since quitting: 21.6  . Smokeless tobacco: Former Systems developer    Quit date: 09/18/1997  Substance and Sexual Activity  . Alcohol use: Yes    Alcohol/week: 2.0 standard drinks    Types: 1 Cans of beer, 1 Shots of  liquor per week    Comment: He drinks 8-10 beers/day.  . Drug use: Not Currently    Types: Marijuana    Comment:  marijuana 1 x month  . Sexual activity: Yes  Other Topics Concern  . Not on file  Social History Narrative   Lives in [place] with his wife.  He walks his dog about 2.5 miles/day w/o limitations.  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   Social Determinants of Health   Financial Resource Strain:   . Difficulty of Paying Living Expenses: Not on file  Food Insecurity:   . Worried About Charity fundraiser in the Last Year: Not on file  . Ran Out of Food in the Last Year: Not on file  Transportation Needs:   . Lack of Transportation (Medical): Not on file  . Lack of Transportation (Non-Medical): Not on file  Physical Activity:   . Days of Exercise per Week: Not on file  . Minutes of Exercise per Session: Not on file  Stress:   . Feeling of Stress : Not on file  Social Connections:   . Frequency of Communication with Friends and Family: Not on file  . Frequency of Social Gatherings with Friends and Family: Not on file  . Attends Religious Services: Not on file  . Active Member of Clubs or Organizations: Not on file  . Attends Archivist Meetings: Not on file  . Marital Status: Not on file     Family History:  The patient's   family history includes Hypertension in his father and mother; Multiple sclerosis in his sister; Other in his sister and sister.   ROS:   Please see the history of present illness.    ROS All other systems reviewed and are negative.   PHYSICAL EXAM:   VS:  BP 120/64   Pulse 72   Resp (!) 93   Ht 5\' 7"  (1.702 m)   Wt 239 lb 12.8 oz (108.8 kg)   BMI 37.56 kg/m   Physical Exam  GEN: Obese, in no acute distress  Neck: slight increase JVD,no carotid bruits, or masses Cardiac:RRR; no murmurs, rubs, or gallops  Respiratory:  clear to auscultation bilaterally, normal work of breathing GI: soft, nontender,  nondistended, + BS Ext: plus 1 edema without cyanosis, clubbing,  Good distal pulses bilaterally Neuro:  Alert and Oriented x 3 Psych: euthymic mood, full affect  Wt Readings from Last 3 Encounters:  05/06/19 239 lb 12.8 oz (108.8 kg)  11/20/18 233 lb 11 oz (106 kg)  11/13/18 235 lb (106.6 kg)      Studies/Labs Reviewed:   EKG:  EKG is not ordered today.    Recent Labs: 11/13/2018: ALT 22 11/21/2018: BUN 12; Creatinine, Ser 0.80; Hemoglobin 13.7; Platelets 196; Potassium 3.7; Sodium 138   Lipid Panel    Component Value Date/Time   CHOL 142 06/12/2018 1120   TRIG 230 (H) 06/12/2018 1120   HDL 60 06/12/2018 1120   CHOLHDL 2.4 06/12/2018 1120   CHOLHDL 2.7 09/22/2017 0521   VLDL 16 09/22/2017 0521   LDLCALC 36 06/12/2018 1120    Additional studies/ records that were reviewed today include:  2D echo 2019  Study Conclusions   - Left ventricle: The cavity size was mildly dilated. Wall   thickness was increased in a pattern of mild LVH. Systolic   function was at the lower limits of normal. The estimated   ejection fraction was 50%. Wall motion was normal; there were no   regional wall motion abnormalities. Features are consistent with   a pseudonormal left ventricular filling pattern, with concomitant   abnormal relaxation and increased filling pressure (grade 2   diastolic dysfunction). Doppler parameters are consistent with   high ventricular filling pressure. - Mitral valve: There was mild regurgitation. - Left atrium: The atrium was mildly dilated. - Right atrium: The atrium was mildly dilated. - Atrial septum: No defect or patent foramen ovale was identified.     ASSESSMENT:    1. PAF (paroxysmal atrial fibrillation) ([place])   2. Coronary artery calcification seen on CT scan   3. Hypertensive heart disease with congestive heart failure, unspecified heart failure type ([place])   4. Chronic diastolic CHF (congestive heart failure) ([place])   5. Hypertriglyceridemia   6.  Obstructive sleep apnea   7. ETOH abuse      PLAN:  In order of problems listed above:  PAF in 2017 without recurrence.  Has not been anticoagulated because his CHA2DS2-VASc was 1 at that time but now is 3.  Zio patch recommended 10/2018 but he never wore it because the patch kept falling off. He got a new one. I encouraged him to wear it to see if he's having any afib  CAD with 50 to 69% LAD on coronary CTA 10/2017 FFR was negative-no angina  Essential hypertension blood pressure controlled  Acute on chronic diastolic CHF-patient has some fluid overload.  Resolved with doubling up on Lasix for 2 weeks but now recurrence.  Increase Lasix to 40 mg daily.  2 g sodium diet.  Check labs today.  Hypertriglyceridemia on Praluent and fenofibrate check fasting labs today  Obesity exercise and weight loss recommended  OSA on CPAP compliant  EtOH recommend he decrease alcohol intake.    Medication Adjustments/Labs and Tests Ordered: Current medicines are reviewed at length with the patient today.  Concerns regarding medicines are outlined above.  Medication changes, Labs and Tests ordered today are listed in the Patient Instructions below. Patient Instructions   Medication Instructions:  Your physician has recommended you make the following change in your medication:   INCREASE: furosemide (lasix) to 40 mg once a day  *If you need a refill on your cardiac medications before your next appointment, please call your pharmacy*  Lab Work: TODAY: CMET, CBC, LIPIDS, BNP  If you have labs (blood work) drawn today and your tests are completely normal, you will receive your results only by: [name] MyChart Message (if you have MyChart) OR . A paper copy in the mail If you have any lab test that is abnormal or we need to change your treatment, we  will call you to review the results.  Testing/Procedures: None ordered  Follow-Up: At [facility], you and your health needs are our priority.  As  part of our continuing mission to provide you with exceptional heart care, we have created designated Provider Care Teams.  These Care Teams include your primary Cardiologist (physician) and Advanced Practice Providers (APPs -  Physician Assistants and Nurse Practitioners) who all work together to provide you with the care you need, when you need it.  Your next appointment:   12 month(s)  The format for your next appointment:   In Person  Provider:   You may see Ena Dawley, MD or one of the following Advanced Practice Providers on your designated Care Team:    Carl Copa, PA-C  Ermalinda Barrios, PA-C   Other Instructions Decrease you alcohol consumption  Your provider recommends that you maintain 150 minutes per week of moderate aerobic activity.  Two Gram Sodium Diet 2000 mg  What is Sodium? Sodium is a mineral found naturally in many foods. The most significant source of sodium in the diet is table salt, which is about 40% sodium.  Processed, convenience, and preserved foods also contain a large amount of sodium.  The body needs only 500 mg of sodium daily to function,  A normal diet provides more than enough sodium even if you do not use salt.  Why Limit Sodium? A build up of sodium in the body can cause thirst, increased blood pressure, shortness of breath, and water retention.  Decreasing sodium in the diet can reduce edema and risk of heart attack or stroke associated with high blood pressure.  Keep in mind that there are many other factors involved in these health problems.  Heredity, obesity, lack of exercise, cigarette smoking, stress and what you eat all play a role.  General Guidelines:  Do not add salt at the table or in cooking.  One teaspoon of salt contains over 2 grams of sodium.  Read food labels  Avoid processed and convenience foods  Ask your dietitian before eating any foods not dicussed in the menu planning guidelines  Consult your physician if you wish to  use a salt substitute or a sodium containing medication such as antacids.  Limit milk and milk products to 16 oz (2 cups) per day.  Shopping Hints:  READ LABELS!! "Dietetic" does not necessarily mean low sodium.  Salt and other sodium ingredients are often added to foods during processing.   Menu Planning Guidelines Food Group Choose More Often Avoid  Beverages (see also the milk group All fruit juices, low-sodium, salt-free vegetables juices, low-sodium carbonated beverages Regular vegetable or tomato juices, commercially softened water used for drinking or cooking  Breads and Cereals Enriched white, wheat, rye and pumpernickel bread, hard rolls and dinner rolls; muffins, cornbread and waffles; most dry cereals, cooked cereal without added salt; unsalted crackers and breadsticks; low sodium or homemade bread crumbs Bread, rolls and crackers with salted tops; quick breads; instant hot cereals; pancakes; commercial bread stuffing; self-rising flower and biscuit mixes; regular bread crumbs or cracker crumbs  Desserts and Sweets Desserts and sweets mad with mild should be within allowance Instant pudding mixes and cake mixes  Fats Butter or margarine; vegetable oils; unsalted salad dressings, regular salad dressings limited to 1 Tbs; light, sour and heavy cream Regular salad dressings containing bacon fat, bacon bits, and salt pork; snack dips made with instant soup mixes or processed cheese; salted nuts  Fruits Most fresh, frozen and  canned fruits Fruits processed with salt or sodium-containing ingredient (some dried fruits are processed with sodium sulfites        Vegetables Fresh, frozen vegetables and low- sodium canned vegetables Regular canned vegetables, sauerkraut, pickled vegetables, and others prepared in brine; frozen vegetables in sauces; vegetables seasoned with ham, bacon or salt pork  Condiments, Sauces, Miscellaneous  Salt substitute with physician's approval; pepper, herbs,  spices; vinegar, lemon or lime juice; hot pepper sauce; garlic powder, onion powder, low sodium soy sauce (1 Tbs.); low sodium condiments (ketchup, chili sauce, mustard) in limited amounts (1 tsp.) fresh ground horseradish; unsalted tortilla chips, pretzels, potato chips, popcorn, salsa (1/4 cup) Any seasoning made with salt including garlic salt, celery salt, onion salt, and seasoned salt; sea salt, rock salt, kosher salt; meat tenderizers; monosodium glutamate; mustard, regular soy sauce, barbecue, sauce, chili sauce, teriyaki sauce, steak sauce, Worcestershire sauce, and most flavored vinegars; canned gravy and mixes; regular condiments; salted snack foods, olives, picles, relish, horseradish sauce, catsup   Food preparation: Try these seasonings Meats:    Pork Sage, onion Serve with applesauce  Chicken Poultry seasoning, thyme, parsley Serve with cranberry sauce  Lamb Curry powder, rosemary, garlic, thyme Serve with mint sauce or jelly  Veal Marjoram, basil Serve with current jelly, cranberry sauce  Beef Pepper, bay leaf Serve with dry mustard, unsalted chive butter  Fish [place] leaf, dill Serve with unsalted lemon butter, unsalted parsley butter  Vegetables:    Asparagus Lemon juice   Broccoli Lemon juice   Carrots Mustard dressing parsley, mint, nutmeg, glazed with unsalted butter and sugar   Green beans Marjoram, lemon juice, nutmeg,dill seed   Tomatoes Basil, marjoram, onion   Spice /blend for [employer]" 4 tsp ground thyme 1 tsp ground sage 3 tsp ground rosemary 4 tsp ground marjoram   Test your knowledge 1. A product that says "Salt Free" may still contain sodium. True or False 2. Garlic Powder and Hot Pepper Sauce an be used as alternative seasonings.True or False 3. Processed foods have more sodium than fresh foods.  True or False 4. Canned Vegetables have less sodium than froze True or False  WAYS TO DECREASE YOUR SODIUM INTAKE 1. Avoid the use of added salt in cooking and at  the table.  Table salt (and other prepared seasonings which contain salt) is probably one of the greatest sources of sodium in the diet.  Unsalted foods can gain flavor from the sweet, sour, and butter taste sensations of herbs and spices.  Instead of using salt for seasoning, try the following seasonings with the foods listed.  Remember: how you use them to enhance natural food flavors is limited only by your creativity... Allspice-Meat, fish, eggs, fruit, peas, red and yellow vegetables Almond Extract-Fruit baked goods Anise Seed-Sweet breads, fruit, carrots, beets, cottage cheese, cookies (tastes like licorice) Basil-Meat, fish, eggs, vegetables, rice, vegetables salads, soups, sauces Bay Leaf-Meat, fish, stews, poultry Burnet-Salad, vegetables (cucumber-like flavor) Caraway Seed-Bread, cookies, cottage cheese, meat, vegetables, cheese, rice Cardamon-Baked goods, fruit, soups Celery Powder or seed-Salads, salad dressings, sauces, meatloaf, soup, bread.Do not use  celery salt Chervil-Meats, salads, fish, eggs, vegetables, cottage cheese (parsley-like flavor) Chili Power-Meatloaf, chicken cheese, corn, eggplant, egg dishes Chives-Salads cottage cheese, egg dishes, soups, vegetables, sauces Cilantro-Salsa, casseroles Cinnamon-Baked goods, fruit, pork, lamb, chicken, carrots Cloves-Fruit, baked goods, fish, pot roast, green beans, beets, carrots Coriander-Pastry, cookies, meat, salads, cheese (lemon-orange flavor) Cumin-Meatloaf, fish,cheese, eggs, cabbage,fruit pie (caraway flavor) Curry Powder-Meat, fruit, eggs, fish, poultry, cottage cheese, vegetables  Dill Seed-Meat, cottage cheese, poultry, vegetables, fish, salads, bread Fennel Seed-Bread, cookies, apples, pork, eggs, fish, beets, cabbage, cheese, Licorice-like flavor Garlic-(buds or powder) Salads, meat, poultry, fish, bread, butter, vegetables, potatoes.Do not  use garlic salt Ginger-Fruit, vegetables, baked goods, meat, fish,  poultry Horseradish Root-Meet, vegetables, butter Lemon Juice or Extract-Vegetables, fruit, tea, baked goods, fish salads Mace-Baked goods fruit, vegetables, fish, poultry (taste like nutmeg) Maple Extract-Syrups Marjoram-Meat, chicken, fish, vegetables, breads, green salads (taste like Sage) Mint-Tea, lamb, sherbet, vegetables, desserts, carrots, cabbage Mustard, Dry or Seed-Cheese, eggs, meats, vegetables, poultry Nutmeg-Baked goods, fruit, chicken, eggs, vegetables, desserts Onion Powder-Meat, fish, poultry, vegetables, cheese, eggs, bread, rice salads (Do not use   Onion salt) Orange Extract-Desserts, baked goods Oregano-Pasta, eggs, cheese, onions, pork, lamb, fish, chicken, vegetables, green salads Paprika-Meat, fish, poultry, eggs, cheese, vegetables Parsley Flakes-Butter, vegetables, meat fish, poultry, eggs, bread, salads (certain forms may   Contain sodium Pepper-Meat fish, poultry, vegetables, eggs Peppermint Extract-Desserts, baked goods Poppy Seed-Eggs, bread, cheese, fruit dressings, baked goods, noodles, vegetables, cottage  [employer], poultry, meat, fish, cauliflower, turnips,eggs bread Saffron-Rice, bread, veal, chicken, fish, eggs Sage-Meat, fish, poultry, onions, eggplant, tomateos, pork, stews Savory-Eggs, salads, poultry, meat, rice, vegetables, soups, pork Tarragon-Meat, poultry, fish, eggs, butter, vegetables (licorice-like flavor)  Thyme-Meat, poultry, fish, eggs, vegetables, (clover-like flavor), sauces, soups Tumeric-Salads, butter, eggs, fish, rice, vegetables (saffron-like flavor) Vanilla Extract-Baked goods, candy Vinegar-Salads, vegetables, meat marinades Walnut Extract-baked goods, candy  2. Choose your Foods Wisely   The following is a list of foods to avoid which are high in sodium:  Meats-Avoid all smoked, canned, salt cured, dried and kosher meat and fish as well as Anchovies   [employer]  meats:Bologna, Liverwurst, Pastrami Canned meat or fish  Marinated herring Caviar    Pepperoni Corned Beef   Pizza Dried chipped beef  Salami Frozen breaded fish or meat Salt pork Frankfurters or hot dogs  Sardines Gefilte fish   Sausage Ham (boiled ham, Proscuitto Smoked butt    spiced ham)   Spam      TV Dinners Vegetables Canned vegetables (Regular) Relish Canned mushrooms  Sauerkraut Olives    Tomato juice Pickles  Bakery and Dessert Products Canned puddings  Cream pies Cheesecake   Decorated cakes Cookies  Beverages/Juices Tomato juice, regular  Gatorade   V-8 vegetable juice, regular  Breads and Cereals Biscuit mixes   Salted potato chips, corn chips, pretzels Bread stuffing mixes  Salted crackers and rolls Pancake and waffle mixes Self-rising flour  Seasonings Accent    Meat sauces Barbecue sauce  Meat tenderizer Catsup    Monosodium glutamate (MSG) Celery salt   Onion salt Chili sauce   Prepared mustard Garlic salt   Salt, seasoned salt, sea salt Gravy mixes   Soy sauce Horseradish   Steak sauce Ketchup   Tartar sauce Lite salt    Teriyaki sauce Marinade mixes   Worcestershire sauce  Others Baking powder   Cocoa and cocoa mixes Baking soda   Commercial casserole mixes Candy-caramels, chocolate  Dehydrated soups    Bars, fudge,nougats  Instant rice and pasta mixes Canned broth or soup  Maraschino cherries Cheese, aged and processed cheese and cheese spreads  Learning Assessment Quiz  Indicated T (for True) or F (for False) for each of the following statements:  1. _____ Fresh fruits and vegetables and unprocessed grains are generally low in sodium 2. _____ Water may contain a considerable amount of sodium, depending on the source 3. _____  You can always tell if a food is high in sodium by tasting it 4. _____ Certain laxatives my be high in sodium and should be avoided unless prescribed   by a physician or pharmacist 5. _____ Salt substitutes may be  used freely by anyone on a sodium restricted diet 6. _____ Sodium is present in table salt, food additives and as a natural component of   most foods 7. _____ Table salt is approximately 90% sodium 8. _____ Limiting sodium intake may help prevent excess fluid accumulation in the body 9. _____ On a sodium-restricted diet, seasonings such as bouillon soy sauce, and    cooking wine should be used in place of table salt 10. _____ On an ingredient list, a product which lists monosodium glutamate as the first   ingredient is an appropriate food to include on a low sodium diet  Circle the best answer(s) to the following statements (Hint: there may be more than one correct answer)  11. On a low-sodium diet, some acceptable snack items are:    A. Olives  F. Bean dip   K. Grapefruit juice    B. Salted Pretzels G. Commercial Popcorn   L. Canned peaches    C. Carrot Sticks  H. Bouillon   M. Unsalted nuts   D. [place] fries  I. Peanut butter crackers N. Salami   E. Sweet pickles J. Tomato Juice   O. Pizza  12.  Seasonings that may be used freely on a reduced - sodium diet include   A. Lemon wedges F.Monosodium glutamate K. Celery seed    B.Soysauce   G. Pepper   L. Mustard powder   C. Sea salt  H. Cooking wine  M. Onion flakes   D. Vinegar  E. Prepared horseradish N. Salsa   E. Sage   J. Worcestershire sauce  O. Chutney      Signed, Ermalinda Barrios, PA-C  05/06/2019 1:53 PM    [facility] [place], [place], [place]  [id number] Phone: [phone]; Fax: [phone]

## 2019-05-06 ENCOUNTER — Other Ambulatory Visit: Payer: Self-pay

## 2019-05-06 ENCOUNTER — Encounter: Payer: Self-pay | Admitting: Physician Assistant

## 2019-05-06 ENCOUNTER — Ambulatory Visit: Payer: BC Managed Care – PPO | Admitting: Physician Assistant

## 2019-05-06 VITALS — BP 120/64 | HR 72 | Resp 93 | Ht 67.0 in | Wt 239.8 lb

## 2019-05-06 DIAGNOSIS — G4733 Obstructive sleep apnea (adult) (pediatric): Secondary | ICD-10-CM

## 2019-05-06 DIAGNOSIS — I2510 Atherosclerotic heart disease of native coronary artery without angina pectoris: Secondary | ICD-10-CM | POA: Diagnosis not present

## 2019-05-06 DIAGNOSIS — I110 Hypertensive heart disease with heart failure: Secondary | ICD-10-CM

## 2019-05-06 DIAGNOSIS — I480 Paroxysmal atrial fibrillation: Secondary | ICD-10-CM | POA: Diagnosis not present

## 2019-05-06 DIAGNOSIS — E781 Pure hyperglyceridemia: Secondary | ICD-10-CM

## 2019-05-06 DIAGNOSIS — I5032 Chronic diastolic (congestive) heart failure: Secondary | ICD-10-CM | POA: Diagnosis not present

## 2019-05-06 DIAGNOSIS — F1010 Alcohol abuse, uncomplicated: Secondary | ICD-10-CM

## 2019-05-06 MED ORDER — FUROSEMIDE 40 MG PO TABS: 40.0000 mg | ORAL_TABLET | Freq: Every day | ORAL | 3 refills | Status: DC

## 2019-05-06 NOTE — Patient Instructions (Signed)
Medication Instructions:  Your physician has recommended you make the following change in your medication:   INCREASE: furosemide (lasix) to 40 mg once a day  *If you need a refill on your cardiac medications before your next appointment, please call your pharmacy*  Lab Work: TODAY: CMET, CBC, LIPIDS, BNP  If you have labs (blood work) drawn today and your tests are completely normal, you will receive your results only by: [name] MyChart Message (if you have MyChart) OR . A paper copy in the mail If you have any lab test that is abnormal or we need to change your treatment, we will call you to review the results.  Testing/Procedures: None ordered  Follow-Up: At [facility], you and your health needs are our priority.  As part of our continuing mission to provide you with exceptional heart care, we have created designated Provider Care Teams.  These Care Teams include your primary Cardiologist (physician) and Advanced Practice Providers (APPs -  Physician Assistants and Nurse Practitioners) who all work together to provide you with the care you need, when you need it.  Your next appointment:   12 month(s)  The format for your next appointment:   In Person  Provider:   You may see Ena Dawley, MD or one of the following Advanced Practice Providers on your designated Care Team:    Melina Copa, PA-C  Ermalinda Barrios, PA-C   Other Instructions Decrease you alcohol consumption  Your provider recommends that you maintain 150 minutes per week of moderate aerobic activity.  Two Gram Sodium Diet 2000 mg  What is Sodium? Sodium is a mineral found naturally in many foods. The most significant source of sodium in the diet is table salt, which is about 40% sodium.  Processed, convenience, and preserved foods also contain a large amount of sodium.  The body needs only 500 mg of sodium daily to function,  A normal diet provides more than enough sodium even if you do not use salt.  Why  Limit Sodium? A build up of sodium in the body can cause thirst, increased blood pressure, shortness of breath, and water retention.  Decreasing sodium in the diet can reduce edema and risk of heart attack or stroke associated with high blood pressure.  Keep in mind that there are many other factors involved in these health problems.  Heredity, obesity, lack of exercise, cigarette smoking, stress and what you eat all play a role.  General Guidelines:  Do not add salt at the table or in cooking.  One teaspoon of salt contains over 2 grams of sodium.  Read food labels  Avoid processed and convenience foods  Ask your dietitian before eating any foods not dicussed in the menu planning guidelines  Consult your physician if you wish to use a salt substitute or a sodium containing medication such as antacids.  Limit milk and milk products to 16 oz (2 cups) per day.  Shopping Hints:  READ LABELS!! "Dietetic" does not necessarily mean low sodium.  Salt and other sodium ingredients are often added to foods during processing.   Menu Planning Guidelines Food Group Choose More Often Avoid  Beverages (see also the milk group All fruit juices, low-sodium, salt-free vegetables juices, low-sodium carbonated beverages Regular vegetable or tomato juices, commercially softened water used for drinking or cooking  Breads and Cereals Enriched white, wheat, rye and pumpernickel bread, hard rolls and dinner rolls; muffins, cornbread and waffles; most dry cereals, cooked cereal without added salt; unsalted crackers and  breadsticks; low sodium or homemade bread crumbs Bread, rolls and crackers with salted tops; quick breads; instant hot cereals; pancakes; commercial bread stuffing; self-rising flower and biscuit mixes; regular bread crumbs or cracker crumbs  Desserts and Sweets Desserts and sweets mad with mild should be within allowance Instant pudding mixes and cake mixes  Fats Butter or margarine; vegetable oils;  unsalted salad dressings, regular salad dressings limited to 1 Tbs; light, sour and heavy cream Regular salad dressings containing bacon fat, bacon bits, and salt pork; snack dips made with instant soup mixes or processed cheese; salted nuts  Fruits Most fresh, frozen and canned fruits Fruits processed with salt or sodium-containing ingredient (some dried fruits are processed with sodium sulfites        Vegetables Fresh, frozen vegetables and low- sodium canned vegetables Regular canned vegetables, sauerkraut, pickled vegetables, and others prepared in brine; frozen vegetables in sauces; vegetables seasoned with ham, bacon or salt pork  Condiments, Sauces, Miscellaneous  Salt substitute with physician's approval; pepper, herbs, spices; vinegar, lemon or lime juice; hot pepper sauce; garlic powder, onion powder, low sodium soy sauce (1 Tbs.); low sodium condiments (ketchup, chili sauce, mustard) in limited amounts (1 tsp.) fresh ground horseradish; unsalted tortilla chips, pretzels, potato chips, popcorn, salsa (1/4 cup) Any seasoning made with salt including garlic salt, celery salt, onion salt, and seasoned salt; sea salt, rock salt, kosher salt; meat tenderizers; monosodium glutamate; mustard, regular soy sauce, barbecue, sauce, chili sauce, teriyaki sauce, steak sauce, Worcestershire sauce, and most flavored vinegars; canned gravy and mixes; regular condiments; salted snack foods, olives, picles, relish, horseradish sauce, catsup   Food preparation: Try these seasonings Meats:    Pork Sage, onion Serve with applesauce  Chicken Poultry seasoning, thyme, parsley Serve with cranberry sauce  Lamb Curry powder, rosemary, garlic, thyme Serve with mint sauce or jelly  Veal Marjoram, basil Serve with current jelly, cranberry sauce  Beef Pepper, bay leaf Serve with dry mustard, unsalted chive butter  Fish [place] leaf, dill Serve with unsalted lemon butter, unsalted parsley butter  Vegetables:     Asparagus Lemon juice   Broccoli Lemon juice   Carrots Mustard dressing parsley, mint, nutmeg, glazed with unsalted butter and sugar   Green beans Marjoram, lemon juice, nutmeg,dill seed   Tomatoes Basil, marjoram, onion   Spice /blend for [employer]" 4 tsp ground thyme 1 tsp ground sage 3 tsp ground rosemary 4 tsp ground marjoram   Test your knowledge 1. A product that says "Salt Free" may still contain sodium. True or False 2. Garlic Powder and Hot Pepper Sauce an be used as alternative seasonings.True or False 3. Processed foods have more sodium than fresh foods.  True or False 4. Canned Vegetables have less sodium than froze True or False  WAYS TO DECREASE YOUR SODIUM INTAKE 1. Avoid the use of added salt in cooking and at the table.  Table salt (and other prepared seasonings which contain salt) is probably one of the greatest sources of sodium in the diet.  Unsalted foods can gain flavor from the sweet, sour, and butter taste sensations of herbs and spices.  Instead of using salt for seasoning, try the following seasonings with the foods listed.  Remember: how you use them to enhance natural food flavors is limited only by your creativity... Allspice-Meat, fish, eggs, fruit, peas, red and yellow vegetables Almond Extract-Fruit baked goods Anise Seed-Sweet breads, fruit, carrots, beets, cottage cheese, cookies (tastes like licorice) Basil-Meat, fish, eggs, vegetables, rice, vegetables salads, soups,  sauces Bay Leaf-Meat, fish, stews, poultry Burnet-Salad, vegetables (cucumber-like flavor) Caraway Seed-Bread, cookies, cottage cheese, meat, vegetables, cheese, rice Cardamon-Baked goods, fruit, soups Celery Powder or seed-Salads, salad dressings, sauces, meatloaf, soup, bread.Do not use  celery salt Chervil-Meats, salads, fish, eggs, vegetables, cottage cheese (parsley-like flavor) Chili Power-Meatloaf, chicken cheese, corn, eggplant, egg dishes Chives-Salads cottage cheese, egg  dishes, soups, vegetables, sauces Cilantro-Salsa, casseroles Cinnamon-Baked goods, fruit, pork, lamb, chicken, carrots Cloves-Fruit, baked goods, fish, pot roast, green beans, beets, carrots Coriander-Pastry, cookies, meat, salads, cheese (lemon-orange flavor) Cumin-Meatloaf, fish,cheese, eggs, cabbage,fruit pie (caraway flavor) [employer], fruit, eggs, fish, poultry, cottage cheese, vegetables Dill Seed-Meat, cottage cheese, poultry, vegetables, fish, salads, bread Fennel Seed-Bread, cookies, apples, pork, eggs, fish, beets, cabbage, cheese, Licorice-like flavor Garlic-(buds or powder) Salads, meat, poultry, fish, bread, butter, vegetables, potatoes.Do not  use garlic salt Ginger-Fruit, vegetables, baked goods, meat, fish, poultry Horseradish Root-Meet, vegetables, butter Lemon Juice or Extract-Vegetables, fruit, tea, baked goods, fish salads Mace-Baked goods fruit, vegetables, fish, poultry (taste like nutmeg) Maple Extract-Syrups Marjoram-Meat, chicken, fish, vegetables, breads, green salads (taste like Sage) Mint-Tea, lamb, sherbet, vegetables, desserts, carrots, cabbage Mustard, Dry or Seed-Cheese, eggs, meats, vegetables, poultry Nutmeg-Baked goods, fruit, chicken, eggs, vegetables, desserts Onion Powder-Meat, fish, poultry, vegetables, cheese, eggs, bread, rice salads (Do not use   Onion salt) Orange Extract-Desserts, baked goods Oregano-Pasta, eggs, cheese, onions, pork, lamb, fish, chicken, vegetables, green salads Paprika-Meat, fish, poultry, eggs, cheese, vegetables Parsley Flakes-Butter, vegetables, meat fish, poultry, eggs, bread, salads (certain forms may   Contain sodium Pepper-Meat fish, poultry, vegetables, eggs Peppermint Extract-Desserts, baked goods Poppy Seed-Eggs, bread, cheese, fruit dressings, baked goods, noodles, vegetables, cottage  [employer], poultry, meat, fish, cauliflower, turnips,eggs bread Saffron-Rice,  bread, veal, chicken, fish, eggs Sage-Meat, fish, poultry, onions, eggplant, tomateos, pork, stews Savory-Eggs, salads, poultry, meat, rice, vegetables, soups, pork Tarragon-Meat, poultry, fish, eggs, butter, vegetables (licorice-like flavor)  Thyme-Meat, poultry, fish, eggs, vegetables, (clover-like flavor), sauces, soups Tumeric-Salads, butter, eggs, fish, rice, vegetables (saffron-like flavor) Vanilla Extract-Baked goods, candy Vinegar-Salads, vegetables, meat marinades Walnut Extract-baked goods, candy  2. Choose your Foods Wisely   The following is a list of foods to avoid which are high in sodium:  Meats-Avoid all smoked, canned, salt cured, dried and kosher meat and fish as well as Anchovies   [employer] meats:Bologna, Liverwurst, Pastrami Canned meat or fish  Marinated herring Caviar    Pepperoni Corned Beef   Pizza Dried chipped beef  Salami Frozen breaded fish or meat Salt pork Frankfurters or hot dogs  Sardines Gefilte fish   Sausage Ham (boiled ham, Proscuitto Smoked butt    spiced ham)   Spam      TV Dinners Vegetables Canned vegetables (Regular) Relish Canned mushrooms  Sauerkraut Olives    Tomato juice Pickles  Bakery and Dessert Products Canned puddings  Cream pies Cheesecake   Decorated cakes Cookies  Beverages/Juices Tomato juice, regular  Gatorade   V-8 vegetable juice, regular  Breads and Cereals Biscuit mixes   Salted potato chips, corn chips, pretzels Bread stuffing mixes  Salted crackers and rolls Pancake and waffle mixes Self-rising flour  Seasonings Accent    Meat sauces Barbecue sauce  Meat tenderizer Catsup    Monosodium glutamate (MSG) Celery salt   Onion salt Chili sauce   Prepared mustard Garlic salt   Salt, seasoned salt, sea salt Gravy mixes   Soy sauce Horseradish   Steak sauce Ketchup   Tartar sauce Lite salt    Teriyaki  sauce Marinade mixes   Worcestershire sauce  Others Baking powder   Cocoa and cocoa  mixes Baking soda   Commercial casserole mixes Candy-caramels, chocolate  Dehydrated soups    Bars, fudge,nougats  Instant rice and pasta mixes Canned broth or soup  Maraschino cherries Cheese, aged and processed cheese and cheese spreads  Learning Assessment Quiz  Indicated T (for True) or F (for False) for each of the following statements:  1. _____ Fresh fruits and vegetables and unprocessed grains are generally low in sodium 2. _____ Water may contain a considerable amount of sodium, depending on the source 3. _____ You can always tell if a food is high in sodium by tasting it 4. _____ Certain laxatives my be high in sodium and should be avoided unless prescribed   by a physician or pharmacist 5. _____ Salt substitutes may be used freely by anyone on a sodium restricted diet 6. _____ Sodium is present in table salt, food additives and as a natural component of   most foods 7. _____ Table salt is approximately 90% sodium 8. _____ Limiting sodium intake may help prevent excess fluid accumulation in the body 9. _____ On a sodium-restricted diet, seasonings such as bouillon soy sauce, and    cooking wine should be used in place of table salt 10. _____ On an ingredient list, a product which lists monosodium glutamate as the first   ingredient is an appropriate food to include on a low sodium diet  Circle the best answer(s) to the following statements (Hint: there may be more than one correct answer)  11. On a low-sodium diet, some acceptable snack items are:    A. Olives  F. Bean dip   K. Grapefruit juice    B. Salted Pretzels G. Commercial Popcorn   L. Canned peaches    C. Carrot Sticks  H. Bouillon   M. Unsalted nuts   D. [place] fries  I. Peanut butter crackers N. Salami   E. Sweet pickles J. Tomato Juice   O. Pizza  12.  Seasonings that may be used freely on a reduced - sodium diet include   A. Lemon wedges F.Monosodium glutamate K. Celery seed    B.Soysauce   G. Pepper   L.  Mustard powder   C. Sea salt  H. Cooking wine  M. Onion flakes   D. Vinegar  E. Prepared horseradish N. Salsa   E. Sage   J. Worcestershire sauce  O. Chutney

## 2019-05-07 ENCOUNTER — Telehealth: Payer: Self-pay | Admitting: Pharmacist

## 2019-05-07 LAB — COMPREHENSIVE METABOLIC PANEL
ALT: 18 IU/L (ref 0–44)
AST: 21 IU/L (ref 0–40)
Albumin/Globulin Ratio: 1.9 (ref 1.2–2.2)
Albumin: 4 g/dL (ref 3.8–4.8)
Alkaline Phosphatase: 66 IU/L (ref 39–117)
BUN/Creatinine Ratio: 18 (ref 10–24)
BUN: 14 mg/dL (ref 8–27)
Bilirubin Total: 0.2 mg/dL (ref 0.0–1.2)
CO2: 25 mmol/L (ref 20–29)
Calcium: 9 mg/dL (ref 8.6–10.2)
Chloride: 103 mmol/L (ref 96–106)
Creatinine, Ser: 0.8 mg/dL (ref 0.76–1.27)
GFR calc Af Amer: 111 mL/min/{1.73_m2} (ref >59)
GFR calc non Af Amer: 96 mL/min/{1.73_m2} (ref >59)
Globulin, Total: 2.1 g/dL (ref 1.5–4.5)
Glucose: 139 mg/dL — ABNORMAL HIGH (ref 65–99)
Potassium: 4.7 mmol/L (ref 3.5–5.2)
Sodium: 141 mmol/L (ref 134–144)
Total Protein: 6.1 g/dL (ref 6.0–8.5)

## 2019-05-07 LAB — LIPID PANEL
Chol/HDL Ratio: 4.9 ratio (ref 0.0–5.0)
Cholesterol, Total: 175 mg/dL (ref 100–199)
HDL: 36 mg/dL — ABNORMAL LOW (ref >39)
Triglycerides: 997 mg/dL (ref 0–149)

## 2019-05-07 LAB — PRO B NATRIURETIC PEPTIDE: NT-Pro BNP: 84 pg/mL (ref 0–210)

## 2019-05-07 LAB — CBC
Hematocrit: 44.2 % (ref 37.5–51.0)
Hemoglobin: 14.9 g/dL (ref 13.0–17.7)
MCH: 29.9 pg (ref 26.6–33.0)
MCHC: 33.7 g/dL (ref 31.5–35.7)
MCV: 89 fL (ref 79–97)
Platelets: 204 10*3/uL (ref 150–450)
RBC: 4.98 x10E6/uL (ref 4.14–5.80)
RDW: 13 % (ref 11.6–15.4)
WBC: 5.5 10*3/uL (ref 3.4–10.8)

## 2019-05-07 MED ORDER — ICOSAPENT ETHYL 1 G PO CAPS: 2.0000 g | ORAL_CAPSULE | Freq: Two times a day (BID) | ORAL | 11 refills | Status: DC

## 2019-05-07 NOTE — Telephone Encounter (Addendum)
PA for Vascepa approved through 05/06/2022. I have faxed a copay card over to [facility]. Patient made aware

## 2019-05-07 NOTE — Telephone Encounter (Signed)
Spoke with patient- advised that he speak to his PCP about his blood sugar as they are elevated. Confirmed his labs were fasting and he is still taking fenofibrate. Talked about cutting out carbs/sugars such as soda/breads. Patient states he is working on this. Patient takes OTC fish oil. Will replace this with Vascepa 2g BID. I will call patient with cost once known. Has commercial insurance, hopefully copay card will get cost down.  I have submitted PA for Vascepa. Awaiting determination.

## 2019-06-03 ENCOUNTER — Other Ambulatory Visit: Payer: Self-pay | Admitting: Physician Assistant

## 2019-06-13 ENCOUNTER — Encounter (HOSPITAL_COMMUNITY): Payer: Self-pay | Admitting: Emergency Medicine

## 2019-06-13 ENCOUNTER — Other Ambulatory Visit: Payer: Self-pay

## 2019-06-13 ENCOUNTER — Emergency Department (HOSPITAL_COMMUNITY): Payer: BC Managed Care – PPO

## 2019-06-13 ENCOUNTER — Emergency Department (HOSPITAL_COMMUNITY)
Admission: EM | Admit: 2019-06-13 | Discharge: 2019-06-13 | Disposition: A | Discharge status: Home / Self Care | Payer: BC Managed Care – PPO | Attending: Emergency Medicine | Admitting: Emergency Medicine

## 2019-06-13 DIAGNOSIS — Z79899 Other long term (current) drug therapy: Secondary | ICD-10-CM | POA: Insufficient documentation

## 2019-06-13 DIAGNOSIS — I110 Hypertensive heart disease with heart failure: Secondary | ICD-10-CM | POA: Insufficient documentation

## 2019-06-13 DIAGNOSIS — R438 Other disturbances of smell and taste: Secondary | ICD-10-CM | POA: Insufficient documentation

## 2019-06-13 DIAGNOSIS — R5381 Other malaise: Secondary | ICD-10-CM | POA: Diagnosis present

## 2019-06-13 DIAGNOSIS — I2510 Atherosclerotic heart disease of native coronary artery without angina pectoris: Secondary | ICD-10-CM | POA: Insufficient documentation

## 2019-06-13 DIAGNOSIS — U071 COVID-19: Secondary | ICD-10-CM | POA: Insufficient documentation

## 2019-06-13 DIAGNOSIS — M7918 Myalgia, other site: Secondary | ICD-10-CM | POA: Insufficient documentation

## 2019-06-13 DIAGNOSIS — I5032 Chronic diastolic (congestive) heart failure: Secondary | ICD-10-CM | POA: Insufficient documentation

## 2019-06-13 DIAGNOSIS — R0602 Shortness of breath: Secondary | ICD-10-CM | POA: Diagnosis not present

## 2019-06-13 DIAGNOSIS — E039 Hypothyroidism, unspecified: Secondary | ICD-10-CM | POA: Diagnosis not present

## 2019-06-13 DIAGNOSIS — Z96641 Presence of right artificial hip joint: Secondary | ICD-10-CM | POA: Diagnosis not present

## 2019-06-13 DIAGNOSIS — R05 Cough: Secondary | ICD-10-CM | POA: Insufficient documentation

## 2019-06-13 DIAGNOSIS — Z87891 Personal history of nicotine dependence: Secondary | ICD-10-CM | POA: Diagnosis not present

## 2019-06-13 LAB — CBC
HCT: 44.5 % (ref 39.0–52.0)
Hemoglobin: 13.7 g/dL (ref 13.0–17.0)
MCH: 28.2 pg (ref 26.0–34.0)
MCHC: 30.8 g/dL (ref 30.0–36.0)
MCV: 91.8 fL (ref 80.0–100.0)
Platelets: 208 10*3/uL (ref 150–400)
RBC: 4.85 MIL/uL (ref 4.22–5.81)
RDW: 13.9 % (ref 11.5–15.5)
WBC: 5.5 10*3/uL (ref 4.0–10.5)
nRBC: 0 % (ref 0.0–0.2)

## 2019-06-13 LAB — BASIC METABOLIC PANEL
Anion gap: 10 (ref 5–15)
BUN: 20 mg/dL (ref 8–23)
CO2: 27 mmol/L (ref 22–32)
Calcium: 8.9 mg/dL (ref 8.9–10.3)
Chloride: 101 mmol/L (ref 98–111)
Creatinine, Ser: 1.03 mg/dL (ref 0.61–1.24)
GFR calc Af Amer: >60 mL/min (ref >60)
GFR calc non Af Amer: >60 mL/min (ref >60)
Glucose, Bld: 114 mg/dL — ABNORMAL HIGH (ref 70–99)
Potassium: 4.3 mmol/L (ref 3.5–5.1)
Sodium: 138 mmol/L (ref 135–145)

## 2019-06-13 LAB — PROTIME-INR
INR: 1.1 (ref 0.8–1.2)
Prothrombin Time: 13.9 seconds (ref 11.4–15.2)

## 2019-06-13 LAB — TROPONIN I (HIGH SENSITIVITY): Troponin I (High Sensitivity): <2 ng/L (ref <18)

## 2019-06-13 MED ORDER — DEXAMETHASONE SODIUM PHOSPHATE 10 MG/ML IJ SOLN
8.0000 mg | Freq: Once | INTRAMUSCULAR | Status: AC
  Administered 2019-06-13: 8 mg via INTRAVENOUS
  Filled 2019-06-13: qty 1

## 2019-06-13 MED ORDER — DEXAMETHASONE 6 MG PO TABS: 6.0000 mg | ORAL_TABLET | Freq: Every day | ORAL | 0 refills | Status: DC

## 2019-06-13 MED ORDER — SODIUM CHLORIDE 0.9% FLUSH: 3.0000 mL | Freq: Once | INTRAVENOUS | Status: DC

## 2019-06-13 NOTE — ED Triage Notes (Signed)
Patient reports SOB/chest tightness , loss of taste and productive cough for 2 weeks , denies fever , history of CHF/Afib.

## 2019-06-13 NOTE — Discharge Instructions (Addendum)
It was our pleasure to provide your ER care today - we hope that you feel better.  See COVID instructions/precautions.   Stay active, breath deeply and fully, get adequate nutrition and hydration.   Take decadron as prescribed. We also left message at infusion center to see if you would qualify for any other therapies.   Use your home pulse ox machine.  Return to ER if worse, new symptoms, worsening of breathing/low pulse ox, chest pain, weak/fainting, or other concern.

## 2019-06-13 NOTE — ED Notes (Signed)
Pt reporting that he was COVID positive on January 8th. Has had increasing weakness/fatigue shortness of breath with walking. Pre ambulation, pt oxygen levels 95%, hr 64. Pt walked around in the room for several minutes, oxygen 97%, HR 72 after ambulation. Provider made aware of the same.

## 2019-06-13 NOTE — ED Provider Notes (Addendum)
[facility] EMERGENCY DEPARTMENT Provider Note   CSN: [id number] Arrival date & time: 06/13/19  1847     History Chief Complaint  Patient presents with  . SOB/Cough/Loss of Smell    Carl Humphrey is a 64 y.o. male.  Patient c/o testing positive for covid 2 weeks ago, and with malaise, sob w coughing and activity for past few days. Symptoms acute onset, moderate, persistent. +body aches, +decreased taste and smell. Spouse currently admitted with covid. Denies exertional cp or discomfort. No leg pain or swelling. No pleuritic pain. Denies hx lung disease, is a former smoker.   The history is provided by the patient.       Past Medical History:  Diagnosis Date  . Anxiety   . Arthritis   . CAD (coronary artery disease)    a. nonobstructive by CT 2019.  [name] Chronic diastolic CHF (congestive heart failure) ([place])   . Dysrhythmia   . ETOH abuse   . Former tobacco use   . GERD (gastroesophageal reflux disease)   . Hypertensive heart disease   . Hypertriglyceridemia   . Hypothyroidism   . Obesity   . Obstructive sleep apnea    a. On CPAP x ~ 10 yrs.  [name] PAF (paroxysmal atrial fibrillation) ([place])    a. Dx 08/13/2015, without recurrence.  . Pneumonia     Patient Active Problem List   Diagnosis Date Noted  . OA (osteoarthritis) of hip 11/20/2018  . Statin intolerance 11/29/2017  . Coronary artery calcification seen on CT scan 11/08/2017  . CAD (coronary artery disease) 11/08/2017  . Chronic diastolic CHF (congestive heart failure) ([place]) 10/01/2017  . Claudication ([place]) 09/22/2017  . CAP (community acquired pneumonia) 09/21/2017  . Chest pain 09/21/2017  . Pleural effusion 09/21/2017  . PAF (paroxysmal atrial fibrillation) ([place]) 10/08/2015  . Encounter for long-term (current) use of other high-risk medications 10/08/2015  . Incomplete RBBB 10/08/2015  . Hypertensive heart disease   . Hyperlipidemia   . Obstructive sleep apnea   . Hypothyroidism   . GERD  (gastroesophageal reflux disease)   . ETOH abuse   . Atrial fibrillation ([place])   . Hypertriglyceridemia     Past Surgical History:  Procedure Laterality Date  . COLONOSCOPY    . NO PAST SURGERIES    . no prior surgery    . 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 Hypertension Mother        alive @ 16  . Hypertension Father        died of heart failure @ 22  . Multiple sclerosis Sister        deceased  . Other Sister        A & W  . Other Sister        A & W  . Colon cancer Neg Hx   . Heart attack Neg Hx   . Stroke Neg Hx     Social History   Tobacco Use  . Smoking status: Former Smoker    Quit date: 09/18/1997    Years since quitting: 21.7  . Smokeless tobacco: Former Systems developer    Quit date: 09/18/1997  Substance Use Topics  . Alcohol use: Yes    Alcohol/week: 2.0 standard drinks    Types: 1  Cans of beer, 1 Shots of liquor per week    Comment: He drinks 8-10 beers/day.  . Drug use: Not Currently    Types: Marijuana    Comment:  marijuana 1 x month    Home Medications Prior to Admission medications   Medication Sig Start Date End Date Taking? Authorizing Provider  Alirocumab (PRALUENT) 150 MG/ML SOAJ Inject 150 mg into the skin every 14 (fourteen) days. 01/16/19   Dorothy Spark, MD  ALPRAZolam Duanne Moron) 0.5 MG tablet Take 0.25-0.5 mg by mouth daily as needed for anxiety.  09/05/17   [provider]  fenofibrate 160 MG tablet Take 160 mg by mouth daily.  09/28/12   [provider]  furosemide (LASIX) 40 MG tablet Take 1 tablet (40 mg total) by mouth daily. 05/06/19   Imogene Burn, PA-C  icosapent Ethyl (VASCEPA) 1 g capsule Take 2 capsules (2 g total) by mouth 2 (two) times daily. 05/07/19   Dorothy Spark, MD  levothyroxine (SYNTHROID,  LEVOTHROID) 88 MCG tablet Take 88 mcg by mouth daily before breakfast.  08/08/17   [provider]  meloxicam (MOBIC) 15 MG tablet Take 15 mg by mouth daily. 05/06/19   [provider]  metoprolol tartrate (LOPRESSOR) 50 MG tablet TAKE 1 TABLET BY MOUTH TWICE A DAY 12/06/18   Dorothy Spark, MD  oxymetazoline (AFRIN) 0.05 % nasal spray Place 1 spray into both nostrils 2 (two) times daily. Patient taking differently: Place 1 spray into both nostrils 2 (two) times daily as needed for congestion.  09/23/17   Georgette Shell, MD  pantoprazole (PROTONIX) 40 MG tablet pantoprazole 40 mg tablet,delayed release  TAKE 1 TABLET BY MOUTH EVERY DAY    [provider]  valsartan (DIOVAN) 320 MG tablet Take 320 mg by mouth daily.    [provider]  venlafaxine XR (EFFEXOR-XR) 150 MG 24 hr capsule Take 300 mg by mouth daily.  08/20/15   [provider]  zolpidem (AMBIEN) 10 MG tablet Take 10 mg by mouth at bedtime. 09/05/17   [provider]    Allergies    Pravastatin and Rosuvastatin  Review of Systems   Review of Systems  Constitutional: Positive for fever.  HENT: Negative for sore throat.   Eyes: Negative for redness.  Respiratory: Positive for cough and shortness of breath.   Cardiovascular: Negative for chest pain.  Gastrointestinal: Negative for abdominal pain and vomiting.  Genitourinary: Negative for dysuria and flank pain.  Musculoskeletal: Positive for myalgias. Negative for neck pain and neck stiffness.  Skin: Negative for rash.  Neurological: Negative for headaches.  Hematological: Does not bruise/bleed easily.  Psychiatric/Behavioral: Negative for confusion.    Physical Exam Updated Vital Signs BP 134/79 (BP Location: Left Arm)   Pulse 69   Temp (!) 97.5 F (36.4 C) (Oral)   Resp 20   SpO2 96%   Physical Exam Vitals and nursing note reviewed.  Constitutional:      Appearance: Normal appearance. He is well-developed.    HENT:     Head: Atraumatic.     Nose: Nose normal.     Mouth/Throat:     Mouth: Mucous membranes are moist.     Pharynx: Oropharynx is clear.  Eyes:     General: No scleral icterus.    Conjunctiva/sclera: Conjunctivae normal.  Neck:     Trachea: No tracheal deviation.  Cardiovascular:     Rate and Rhythm: Normal rate and regular rhythm.     Pulses:  Normal pulses.     Heart sounds: Normal heart sounds. No murmur. No friction rub. No gallop.   Pulmonary:     Effort: Pulmonary effort is normal. No accessory muscle usage or respiratory distress.     Breath sounds: Normal breath sounds.  Abdominal:     General: Bowel sounds are normal. There is no distension.     Palpations: Abdomen is soft.     Tenderness: There is no abdominal tenderness. There is no guarding.  Genitourinary:    Comments: No cva tenderness. Musculoskeletal:        General: No swelling or tenderness.     Cervical back: Normal range of motion and neck supple. No rigidity.     Right lower leg: No edema.     Left lower leg: No edema.  Skin:    General: Skin is warm and dry.     Findings: No rash.  Neurological:     Mental Status: He is alert.     Comments: Alert, speech clear.   Psychiatric:        Mood and Affect: Mood normal.     ED Results / Procedures / Treatments   Labs (all labs ordered are listed, but only abnormal results are displayed) Results for orders placed or performed during the hospital encounter of [id number]  Basic metabolic panel  Result Value Ref Range   Sodium 138 135 - 145 mmol/L   Potassium 4.3 3.5 - 5.1 mmol/L   Chloride 101 98 - 111 mmol/L   CO2 27 22 - 32 mmol/L   Glucose, Bld 114 (H) 70 - 99 mg/dL   BUN 20 8 - 23 mg/dL   Creatinine, Ser 1.03 0.61 - 1.24 mg/dL   Calcium 8.9 8.9 - 10.3 mg/dL   GFR calc non Af Amer >60 >60 mL/min   GFR calc Af Amer >60 >60 mL/min   Anion gap 10 5 - 15  CBC  Result Value Ref Range   WBC 5.5 4.0 - 10.5 K/uL   RBC 4.85 4.22 - 5.81 MIL/uL    Hemoglobin 13.7 13.0 - 17.0 g/dL   HCT 44.5 39.0 - 52.0 %   MCV 91.8 80.0 - 100.0 fL   MCH 28.2 26.0 - 34.0 pg   MCHC 30.8 30.0 - 36.0 g/dL   RDW 13.9 11.5 - 15.5 %   Platelets 208 150 - 400 K/uL   nRBC 0.0 0.0 - 0.2 %  Protime-INR (order if Patient is taking Coumadin / Warfarin)  Result Value Ref Range   Prothrombin Time 13.9 11.4 - 15.2 seconds   INR 1.1 0.8 - 1.2  Troponin I (High Sensitivity)  Result Value Ref Range   Troponin I (High Sensitivity) <2 <18 ng/L    EKG EKG Interpretation  Date/Time:  Friday June 13 2019 19:30:17 EST Ventricular Rate:  71 PR Interval:  164 QRS Duration: 100 QT Interval:  410 QTC Calculation: 445 R Axis:   -29 Text Interpretation: Normal sinus rhythm Incomplete right bundle branch block Nonspecific T wave abnormality Confirmed by Lajean Saver [phone]) on 06/13/2019 9:01:26 PM   Radiology DG Chest Portable 1 View  Result Date: 06/13/2019 CLINICAL DATA:  Shortness of breath. Productive cough. Loss of smell. EXAM: PORTABLE CHEST 1 VIEW COMPARISON:  Radiograph 09/21/2017. FINDINGS: Mild cardiomegaly. Unchanged mediastinal contours. Heterogeneous patchy opacities within the periphery of both lungs, mid lower lung zone predominant. Chronic hyperinflation with blunting of the costophrenic angles. No pneumothorax or large pleural effusion. No acute osseous abnormalities are seen.  IMPRESSION: Heterogeneous patchy opacities within the periphery of both lungs, mid lower lung zone predominant. Findings suspicious for multifocal pneumonia, pattern often seen with COVID-19 infection. Electronically Signed   By: Keith Rake M.D.   On: 06/13/2019 22:15    Procedures Procedures (including critical care time)  Medications Ordered in ED Medications  sodium chloride flush (NS) 0.9 % injection 3 mL (has no administration in time range)    ED Course  I have reviewed the triage vital signs and the nursing notes.  Pertinent labs & imaging results that  were available during my care of the patient were reviewed by me and considered in my medical decision making (see chart for details).    MDM Rules/Calculators/A&P                      Labs sent.   Reviewed nursing notes and prior charts for additional history.   Labs reviewed/interpreted by me - after symptoms present, constant, for several days, trop is normal - felt not c/w acs, and more c/w viral/covid syndrome.   Currently, room air pulse ox is 94-95% with no increased wob.   CXR reviewed/interpreted by me - infiltrate c/w covid.  Pulse ox 94%. Pt notes sob with coughing spell, activity.   Decadron dose iv.   Infusion center # called and voice message left to see if patient would qualify for any outpatient therapies - pt informed.   Pt currently appears stable for d/c.   Carl Humphrey was evaluated in Emergency Department on 06/13/2019 for the symptoms described in the history of present illness. He was evaluated in the context of the global COVID-19 pandemic, which necessitated consideration that the patient might be at risk for infection with the SARS-CoV-2 virus that causes COVID-19. Institutional protocols and algorithms that pertain to the evaluation of patients at risk for COVID-19 are in a state of rapid change based on information released by regulatory bodies including the CDC and federal and state organizations. These policies and algorithms were followed during the patient's care in the ED.       Final Clinical Impression(s) / ED Diagnoses Final diagnoses:  None    Rx / DC Orders ED Discharge Orders    None         Lajean Saver, MD 06/13/19 2232

## 2019-06-14 ENCOUNTER — Encounter: Payer: Self-pay | Admitting: Nurse Practitioner

## 2019-06-14 ENCOUNTER — Telehealth (HOSPITAL_COMMUNITY): Payer: Self-pay | Admitting: Nurse Practitioner

## 2019-06-14 NOTE — Telephone Encounter (Signed)
Called to Discuss with patient about Covid symptoms and the use of bamlanivimab, a monoclonal antibody infusion for those with mild to moderate Covid symptoms and at a high risk of hospitalization.     Based on ER note patient would not qualify for infusion at [facility] based on onset of symptoms > 10 days ago. Will speak to patient however to confirm this.      Unable to reach pt. Left message.

## 2019-06-16 ENCOUNTER — Telehealth: Payer: Self-pay | Admitting: Nurse Practitioner

## 2019-06-16 NOTE — Telephone Encounter (Signed)
Called to discuss with Carl Humphrey about Covid symptoms and the use of bamlanivimab, a monoclonal antibody infusion for those with mild to moderate Covid symptoms and at a high risk of hospitalization.     Pt does not qualify for infusion therapy as his symptoms first presented > 10 days prior to timing of infusion. Symptoms tier reviewed as well as criteria for ending isolation. Preventative practices reviewed. Patient verbalized understanding   Patient Active Problem List   Diagnosis Date Noted  . OA (osteoarthritis) of hip 11/20/2018  . Statin intolerance 11/29/2017  . Coronary artery calcification seen on CT scan 11/08/2017  . CAD (coronary artery disease) 11/08/2017  . Chronic diastolic CHF (congestive heart failure) ([place]) 10/01/2017  . Claudication ([place]) 09/22/2017  . CAP (community acquired pneumonia) 09/21/2017  . Chest pain 09/21/2017  . Pleural effusion 09/21/2017  . PAF (paroxysmal atrial fibrillation) ([place]) 10/08/2015  . Encounter for long-term (current) use of other high-risk medications 10/08/2015  . Incomplete RBBB 10/08/2015  . Hypertensive heart disease   . Hyperlipidemia   . Obstructive sleep apnea   . Hypothyroidism   . GERD (gastroesophageal reflux disease)   . ETOH abuse   . Atrial fibrillation ([place])   . Hypertriglyceridemia     Beckey Rutter, DNP, AGNP-C [facility] [email or web address] Main Line: [phone]

## 2019-08-09 ENCOUNTER — Other Ambulatory Visit: Payer: Self-pay | Admitting: Cardiology

## 2019-08-14 ENCOUNTER — Ambulatory Visit: Payer: BC Managed Care – PPO | Attending: Internal Medicine

## 2019-08-14 DIAGNOSIS — Z23 Encounter for immunization: Secondary | ICD-10-CM

## 2019-08-14 NOTE — Progress Notes (Signed)
   Covid-19 Vaccination Clinic  Name:  Carl Humphrey    MRN: [id number] DOB: 1956/01/08  08/14/2019  Carl Humphrey was observed post Covid-19 immunization for 15 minutes without incident. He was provided with Vaccine Information Sheet and instruction to access the V-Safe system.   Carl Humphrey was instructed to call 911 with any severe reactions post vaccine: [name] Difficulty breathing  . Swelling of face and throat  . A fast heartbeat  . A bad rash all over body  . Dizziness and weakness   Immunizations Administered    Name Date Dose VIS Date Route   Pfizer COVID-19 Vaccine 08/14/2019  1:06 PM 0.3 mL 05/02/2019 Intramuscular   Manufacturer: [place]   Lot: [id number]   [place]: [id number]

## 2019-09-08 ENCOUNTER — Ambulatory Visit: Payer: Self-pay | Attending: Internal Medicine

## 2019-09-08 DIAGNOSIS — Z23 Encounter for immunization: Secondary | ICD-10-CM

## 2019-09-08 NOTE — Progress Notes (Signed)
   Covid-19 Vaccination Clinic  Name:  Samuele Sutcliffe    MRN: [id number] DOB: 04-24-1956  09/08/2019  Mr. Fei was observed post Covid-19 immunization for 15 minutes without incident. He was provided with Vaccine Information Sheet and instruction to access the V-Safe system.   Mr. [place] was instructed to call 911 with any severe reactions post vaccine: [name] Difficulty breathing  . Swelling of face and throat  . A fast heartbeat  . A bad rash all over body  . Dizziness and weakness   Immunizations Administered    Name Date Dose VIS Date Route   Pfizer COVID-19 Vaccine 09/08/2019 12:59 PM 0.3 mL 07/16/2018 Intramuscular   Manufacturer: [place]   Lot: [id number]   [place]: [id number]

## 2019-09-10 ENCOUNTER — Other Ambulatory Visit: Payer: Self-pay | Admitting: Cardiology

## 2019-09-24 ENCOUNTER — Other Ambulatory Visit: Payer: Self-pay | Admitting: Cardiology

## 2019-12-15 ENCOUNTER — Other Ambulatory Visit: Payer: Self-pay | Admitting: Cardiology

## 2020-03-27 ENCOUNTER — Other Ambulatory Visit: Payer: Self-pay | Admitting: Cardiology

## 2020-04-12 ENCOUNTER — Ambulatory Visit: Payer: Self-pay | Admitting: Physician Assistant

## 2020-04-20 NOTE — Progress Notes (Signed)
Cardiology Office Note    Date:  04/21/2020   ID:  Carl Humphrey, DOB 10/07/1955, MRN [id number]  PCP:  [place]  Cardiologist: Ena Dawley, MD EPS: None  No chief complaint on file.   History of Present Illness:  Carl Humphrey is a 64 y.o. male with history of PAF in 2017 without recurrence, nonobstructive CAD on coronary CTA 10/2017 70 percentile calcium score, 50 to 69% LAD with negative FFR, HTN, hypertriglyceridemia, chronic diastolic CHF, obesity, OSA on CPAP, heavy EtOH use.  Last echo 2019 LVEF 50% with grade 2 DD   Patient was never anticoagulated for his atrial fib because his chads vas score was one at the time but when I saw him last year his CHA2DS2-VASc was 3.  Zio patch was recommended but he never wore it because the patch kept falling off he got a new one and I asked him to wear this.  He was also having trouble with acute on chronic diastolic CHF that improved with doubling up on Lasix for 2 weeks.  I increase his Lasix to 40 mg daily.  I also asked him to cut back on alcohol intake.  Patient comes in for yearly follow-up.  He never wore Holter monitor. Was just diagnosed with prostate cancer. Doesn't know if he's having surgery or not. Has chronic DOE, hasn't been active. Denies swelling. Walks on occasion but can go a couple miles with his dog if he does. Drinks 6-7 beers daily and 1-2 shots 3-4 days/week. Using his CPAP. 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    Past Medical History:  Diagnosis Date  . Anxiety   . Arthritis   . CAD (coronary artery disease)    a. nonobstructive by CT 2019.  [name] Chronic diastolic CHF (congestive heart failure) ([place])   . Dysrhythmia   . ETOH abuse   . Former tobacco use   . GERD (gastroesophageal reflux disease)   . Hypertensive heart disease   . Hypertriglyceridemia   . Hypothyroidism   . Obesity   . Obstructive sleep apnea    a. On CPAP x ~ 10 yrs.  [name] PAF (paroxysmal atrial  fibrillation) ([place])    a. Dx 08/13/2015, without recurrence.  . Pneumonia     Past Surgical History:  Procedure Laterality Date  . COLONOSCOPY    . NO PAST SURGERIES    . no prior surgery    . TOTAL HIP ARTHROPLASTY Right 11/20/2018   Procedure: TOTAL HIP ARTHROPLASTY ANTERIOR APPROACH;  Surgeon: [name], MD;  Location: [facility];  Service: Orthopedics;  Laterality: Right;  128min    Current Medications: Current Meds  Medication Sig  . ALPRAZolam (XANAX) 0.5 MG tablet Take 0.25-0.5 mg by mouth daily as needed for anxiety.   [name] dexamethasone (DECADRON) 6 MG tablet Take 1 tablet (6 mg total) by mouth daily.  . fenofibrate 160 MG tablet Take 160 mg by mouth daily.   . furosemide (LASIX) 40 MG tablet Take 1 tablet (40 mg total) by mouth daily.  [name] icosapent Ethyl (VASCEPA) 1 g capsule Take 2 capsules (2 g total) by mouth 2 (two) times daily. Please keep upcoming appt in December for future refills. Thank you  . levothyroxine (SYNTHROID, LEVOTHROID) 88 MCG tablet Take 88 mcg by mouth daily before breakfast.   . meloxicam (MOBIC) 15 MG tablet Take 15 mg by mouth daily.  . metoprolol tartrate (LOPRESSOR) 50 MG tablet TAKE 1 TABLET BY MOUTH TWICE A DAY  .  oxymetazoline (AFRIN) 0.05 % nasal spray Place 1 spray into both nostrils 2 (two) times daily. (Patient taking differently: Place 1 spray into both nostrils 2 (two) times daily as needed for congestion. )  . pantoprazole (PROTONIX) 40 MG tablet pantoprazole 40 mg tablet,delayed release  TAKE 1 TABLET BY MOUTH EVERY DAY  . PRALUENT 150 MG/ML SOAJ INJECT 150 MG INTO THE SKIN EVERY 14 DAYS.  [name] valsartan (DIOVAN) 320 MG tablet Take 320 mg by mouth daily.  [name] venlafaxine XR (EFFEXOR-XR) 150 MG 24 hr capsule Take 300 mg by mouth daily.   [name] zolpidem (AMBIEN) 10 MG tablet Take 10 mg by mouth at bedtime.     Allergies:   Pravastatin and Rosuvastatin   Social History   Socioeconomic History  . Marital status: Married    Spouse name: Not on file    . Number of children: Not on file  . Years of education: Not on file  . Highest education level: Not on file  Occupational History  . Not on file  Tobacco Use  . Smoking status: Former Smoker    Quit date: 09/18/1997    Years since quitting: 22.6  . Smokeless tobacco: Former Systems developer    Quit date: 09/18/1997  Vaping Use  . Vaping Use: Never used  Substance and Sexual Activity  . Alcohol use: Yes    Alcohol/week: 2.0 standard drinks    Types: 1 Cans of beer, 1 Shots of liquor per week    Comment: He drinks 8-10 beers/day.  . Drug use: Not Currently    Types: Marijuana    Comment:  marijuana 1 x month  . Sexual activity: Yes  Other Topics Concern  . Not on file  Social History Narrative   Lives in [place] with his wife.  He walks his dog about 2.5 miles/day w/o limitations.  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   Social Determinants of Health   Financial Resource Strain:   . Difficulty of Paying Living Expenses: Not on file  Food Insecurity:   . Worried About Charity fundraiser in the Last Year: Not on file  . Ran Out of Food in the Last Year: Not on file  Transportation Needs:   . Lack of Transportation (Medical): Not on file  . Lack of Transportation (Non-Medical): Not on file  Physical Activity:   . Days of Exercise per Week: Not on file  . Minutes of Exercise per Session: Not on file  Stress:   . Feeling of Stress : Not on file  Social Connections:   . Frequency of Communication with Friends and Family: Not on file  . Frequency of Social Gatherings with Friends and Family: Not on file  . Attends Religious Services: Not on file  . Active Member of Clubs or Organizations: Not on file  . Attends Archivist Meetings: Not on file  . Marital Status: Not on file     Family History:  The patient's   family history includes Hypertension in his father and mother; Multiple sclerosis in his sister; Other in his sister and sister.   ROS:    Please see the history of present illness.    ROS All other systems reviewed and are negative.   PHYSICAL EXAM:   VS:  BP 124/76   Pulse 62   Ht 5\' 7"  (1.702 m)   Wt 237 lb 6.4 oz (107.7 kg)   SpO2 95%   BMI 37.18 kg/m  Physical Exam  GEN: Obese, in no acute distress  Neck: no JVD, carotid bruits, or masses Cardiac:RRR; no murmurs, rubs, or gallops  Respiratory:  clear to auscultation bilaterally, normal work of breathing GI: soft, nontender, nondistended, + BS Ext: without cyanosis, clubbing, or edema, Good distal pulses bilaterally Neuro:  Alert and Oriented x 3 Psych: euthymic mood, full affect  Wt Readings from Last 3 Encounters:  04/21/20 237 lb 6.4 oz (107.7 kg)  05/06/19 239 lb 12.8 oz (108.8 kg)  11/20/18 233 lb 11 oz (106 kg)      Studies/Labs Reviewed:   EKG:  EKG is  ordered today.  The ekg ordered today demonstrates normal sinus rhythm with incomplete RBBB, no change  Recent Labs: 05/06/2019: ALT 18; NT-Pro BNP 84 06/13/2019: BUN 20; Creatinine, Ser 1.03; Hemoglobin 13.7; Platelets 208; Potassium 4.3; Sodium 138   Lipid Panel    Component Value Date/Time   CHOL 175 05/06/2019 1339   TRIG 997 (HH) 05/06/2019 1339   HDL 36 (L) 05/06/2019 1339   CHOLHDL 4.9 05/06/2019 1339   CHOLHDL 2.7 09/22/2017 0521   VLDL 16 09/22/2017 0521   LDLCALC Comment (A) 05/06/2019 1339    Additional studies/ records that were reviewed today include:  2D echo 2019   Study Conclusions   - Left ventricle: The cavity size was mildly dilated. Wall   thickness was increased in a pattern of mild LVH. Systolic   function was at the lower limits of normal. The estimated   ejection fraction was 50%. Wall motion was normal; there were no   regional wall motion abnormalities. Features are consistent with   a pseudonormal left ventricular filling pattern, with concomitant   abnormal relaxation and increased filling pressure (grade 2   diastolic dysfunction). Doppler parameters  are consistent with   high ventricular filling pressure. - Mitral valve: There was mild regurgitation. - Left atrium: The atrium was mildly dilated. - Right atrium: The atrium was mildly dilated. - Atrial septum: No defect or patent foramen ovale was identified.           ASSESSMENT:    1. Paroxysmal atrial fibrillation (HCC)   2. Coronary artery disease involving native coronary artery of native heart without angina pectoris   3. Essential hypertension   4. Chronic diastolic CHF (congestive heart failure) (HCC)   5. Other hyperlipidemia   6. Obstructive sleep apnea   7. ETOH abuse      PLAN:  In order of problems listed above:  PAF in 2017 without recurrence.  CHA2DS2-VASc one at that time but is now 3.  Has never worn monitor provided to rule out recurrence.  Has had no symptoms and is in normal sinus rhythm today.  Continue to to monitor.  CAD 30 to 69% LAD on coronary CTA 10/2017 FFR was negative no angina.  Essential hypertension blood pressure well controlled  Chronic diastolic CHF no evidence of heart failure on exam.  Hyperlipidemia on Praluent and fenofibrate check fasting lipid panel today.  Obesity increase exercise and weight loss.  OSA on CPAP  EtOH discussed decreasing alcohol intake with patient  Prostate cancer diagnosed last week and has appointment in 2 weeks to formulate a plan.  If he does have surgery in the near future he should not need any further cardiac work-up if he has no new symptoms. METS is close to 8 and he has no new cardiac symptoms.  According to the Revised Cardiac Risk Index (RCRI), his Perioperative Risk of Major  Cardiac Event is (%): 6.6  His Functional Capacity in METs is: 7.99 according to the [facility] Activity Status Index (DASI).        Medication Adjustments/Labs and Tests Ordered: Current medicines are reviewed at length with the patient today.  Concerns regarding medicines are outlined above.  Medication changes, Labs  and Tests ordered today are listed in the Patient Instructions below. Patient Instructions  Medication Instructions:  Your physician recommends that you continue on your current medications as directed. Please refer to the Current Medication list given to you today.  *If you need a refill on your cardiac medications before your next appointment, please call your pharmacy*   Lab Work: TODAY: FLP  If you have labs (blood work) drawn today and your tests are completely normal, you will receive your results only by: [name] MyChart Message (if you have MyChart) OR . A paper copy in the mail If you have any lab test that is abnormal or we need to change your treatment, we will call you to review the results.   Testing/Procedures: None   Follow-Up: At [facility], you and your health needs are our priority.  As part of our continuing mission to provide you with exceptional heart care, we have created designated Provider Care Teams.  These Care Teams include your primary Cardiologist (physician) and Advanced Practice Providers (APPs -  Physician Assistants and Nurse Practitioners) who all work together to provide you with the care you need, when you need it.  We recommend signing up for the patient portal called "MyChart".  Sign up information is provided on this After Visit Summary.  MyChart is used to connect with patients for Virtual Visits (Telemedicine).  Patients are able to view lab/test results, encounter notes, upcoming appointments, etc.  Non-urgent messages can be sent to your provider as well.   To learn more about what you can do with MyChart, go to [email or web address].    Your next appointment:   1 year(s)  The format for your next appointment:   In Person  Provider:   You may see Ena Dawley, MD or one of the following Advanced Practice Providers on your designated Care Team:    Melina Copa, PA-C  Ermalinda Barrios, PA-C    Other Instructions Decrease your alcohol  intake     Signed, Ermalinda Barrios, PA-C  04/21/2020 1:03 PM    [facility] [address], [place], [place]  [address] Phone: [phone]; Fax: [phone]

## 2020-04-21 ENCOUNTER — Ambulatory Visit: Payer: BC Managed Care – PPO | Admitting: Physician Assistant

## 2020-04-21 ENCOUNTER — Encounter: Payer: Self-pay | Admitting: Physician Assistant

## 2020-04-21 ENCOUNTER — Other Ambulatory Visit: Payer: Self-pay

## 2020-04-21 VITALS — BP 124/76 | HR 62 | Ht 67.0 in | Wt 237.4 lb

## 2020-04-21 DIAGNOSIS — I5032 Chronic diastolic (congestive) heart failure: Secondary | ICD-10-CM | POA: Diagnosis not present

## 2020-04-21 DIAGNOSIS — I2510 Atherosclerotic heart disease of native coronary artery without angina pectoris: Secondary | ICD-10-CM | POA: Diagnosis not present

## 2020-04-21 DIAGNOSIS — E7849 Other hyperlipidemia: Secondary | ICD-10-CM

## 2020-04-21 DIAGNOSIS — I10 Essential (primary) hypertension: Secondary | ICD-10-CM | POA: Diagnosis not present

## 2020-04-21 DIAGNOSIS — I480 Paroxysmal atrial fibrillation: Secondary | ICD-10-CM

## 2020-04-21 DIAGNOSIS — G4733 Obstructive sleep apnea (adult) (pediatric): Secondary | ICD-10-CM

## 2020-04-21 DIAGNOSIS — F1010 Alcohol abuse, uncomplicated: Secondary | ICD-10-CM

## 2020-04-21 LAB — LIPID PANEL
Chol/HDL Ratio: 2.8 ratio (ref 0.0–5.0)
Cholesterol, Total: 127 mg/dL (ref 100–199)
HDL: 45 mg/dL (ref >39)
LDL Chol Calc (NIH): 9 mg/dL (ref 0–99)
Triglycerides: 578 mg/dL (ref 0–149)
VLDL Cholesterol Cal: 73 mg/dL — ABNORMAL HIGH (ref 5–40)

## 2020-04-21 NOTE — Patient Instructions (Signed)
Medication Instructions:  Your physician recommends that you continue on your current medications as directed. Please refer to the Current Medication list given to you today.  *If you need a refill on your cardiac medications before your next appointment, please call your pharmacy*   Lab Work: TODAY: FLP  If you have labs (blood work) drawn today and your tests are completely normal, you will receive your results only by: [name] MyChart Message (if you have MyChart) OR . A paper copy in the mail If you have any lab test that is abnormal or we need to change your treatment, we will call you to review the results.   Testing/Procedures: None   Follow-Up: At [facility], you and your health needs are our priority.  As part of our continuing mission to provide you with exceptional heart care, we have created designated Provider Care Teams.  These Care Teams include your primary Cardiologist (physician) and Advanced Practice Providers (APPs -  Physician Assistants and Nurse Practitioners) who all work together to provide you with the care you need, when you need it.  We recommend signing up for the patient portal called "MyChart".  Sign up information is provided on this After Visit Summary.  MyChart is used to connect with patients for Virtual Visits (Telemedicine).  Patients are able to view lab/test results, encounter notes, upcoming appointments, etc.  Non-urgent messages can be sent to your provider as well.   To learn more about what you can do with MyChart, go to [email or web address].    Your next appointment:   1 year(s)  The format for your next appointment:   In Person  Provider:   You may see Ena Dawley, MD or one of the following Advanced Practice Providers on your designated Care Team:    Melina Copa, PA-C  Ermalinda Barrios, PA-C    Other Instructions Decrease your alcohol intake

## 2020-04-22 ENCOUNTER — Other Ambulatory Visit: Payer: Self-pay | Admitting: Cardiology

## 2020-04-22 MED ORDER — ICOSAPENT ETHYL 1 G PO CAPS: 2.0000 g | ORAL_CAPSULE | Freq: Two times a day (BID) | ORAL | 3 refills | Status: DC

## 2020-04-29 ENCOUNTER — Other Ambulatory Visit: Payer: Self-pay | Admitting: Physician Assistant

## 2020-04-29 ENCOUNTER — Other Ambulatory Visit: Payer: Self-pay | Admitting: Cardiology

## 2020-05-11 ENCOUNTER — Telehealth: Payer: Self-pay | Admitting: Radiation Oncology

## 2020-05-11 NOTE — Telephone Encounter (Signed)
Received voicemail message from patient requesting to set up a consultation with Dr. Tammi Klippel. Phoned patient back. Explained Dr. Diona Fanti had already made a referral. Provided patient with appointment for 06/08/2020 at 1430 in person with the nurse and 1500 with Dr. Tammi Klippel. Patient verbalized understanding and expressed appreciation for the call.

## 2020-06-08 ENCOUNTER — Encounter: Payer: Self-pay | Admitting: Radiation Oncology

## 2020-06-08 ENCOUNTER — Other Ambulatory Visit: Payer: Self-pay

## 2020-06-08 ENCOUNTER — Ambulatory Visit
Admission: RE | Admit: 2020-06-08 | Discharge: 2020-06-08 | Disposition: A | Discharge status: Home / Self Care | Payer: BC Managed Care – PPO | Source: Ambulatory Visit | Attending: Radiation Oncology | Admitting: Radiation Oncology

## 2020-06-08 DIAGNOSIS — Z720 Tobacco use: Secondary | ICD-10-CM | POA: Insufficient documentation

## 2020-06-08 DIAGNOSIS — C61 Malignant neoplasm of prostate: Secondary | ICD-10-CM

## 2020-06-08 DIAGNOSIS — E559 Vitamin D deficiency, unspecified: Secondary | ICD-10-CM | POA: Insufficient documentation

## 2020-06-08 DIAGNOSIS — I10 Essential (primary) hypertension: Secondary | ICD-10-CM | POA: Insufficient documentation

## 2020-06-08 DIAGNOSIS — F339 Major depressive disorder, recurrent, unspecified: Secondary | ICD-10-CM | POA: Insufficient documentation

## 2020-06-08 DIAGNOSIS — J309 Allergic rhinitis, unspecified: Secondary | ICD-10-CM | POA: Insufficient documentation

## 2020-06-08 DIAGNOSIS — E033 Postinfectious hypothyroidism: Secondary | ICD-10-CM | POA: Insufficient documentation

## 2020-06-08 DIAGNOSIS — N182 Chronic kidney disease, stage 2 (mild): Secondary | ICD-10-CM | POA: Insufficient documentation

## 2020-06-08 DIAGNOSIS — D72829 Elevated white blood cell count, unspecified: Secondary | ICD-10-CM | POA: Insufficient documentation

## 2020-06-08 DIAGNOSIS — F411 Generalized anxiety disorder: Secondary | ICD-10-CM | POA: Insufficient documentation

## 2020-06-08 DIAGNOSIS — F329 Major depressive disorder, single episode, unspecified: Secondary | ICD-10-CM | POA: Insufficient documentation

## 2020-06-08 DIAGNOSIS — M1990 Unspecified osteoarthritis, unspecified site: Secondary | ICD-10-CM | POA: Insufficient documentation

## 2020-06-08 DIAGNOSIS — F419 Anxiety disorder, unspecified: Secondary | ICD-10-CM | POA: Insufficient documentation

## 2020-06-08 DIAGNOSIS — I509 Heart failure, unspecified: Secondary | ICD-10-CM | POA: Insufficient documentation

## 2020-06-08 DIAGNOSIS — G4700 Insomnia, unspecified: Secondary | ICD-10-CM | POA: Insufficient documentation

## 2020-06-08 HISTORY — DX: Malignant neoplasm of prostate: C61

## 2020-06-08 NOTE — Progress Notes (Signed)
GU Location of Tumor / Histology: prostatic adenocarcinoma  If Prostate Cancer, Gleason Score is (3 + 4) and PSA is (6.05). Prostate volume: 19.76  [name] presented with a rising PSA that prompted biopsy.     Biopsies of prostate (if applicable) revealed:   Past/Anticipated interventions by urology, if any: prostate biopsy, referral to Dr. Tammi Klippel to discuss radiation therapy options.  Past/Anticipated interventions by medical oncology, if any: no  Weight changes, if any: no  Bowel/Bladder complaints, if any: IPSS 14. SHIM 1. Denies sexual activity in six months. Denies any bowel complaints. Denies hematuria. Reports brief mild dysuria intermittently in the last 4 weeks. Reports occasional urinary leakage.    Nausea/Vomiting, if any: no  Pain issues, if any:  no  SAFETY ISSUES:  Prior radiation? no  Pacemaker/ICD? no  Possible current pregnancy? no, male patient  Is the patient on methotrexate? no  Current Complaints / other details:  65 year old male. Married. Self employed. Patient tested COVID positive approximately 1 week ago thus consult was changed from in person to a telephone encounter.

## 2020-06-08 NOTE — Progress Notes (Signed)
Radiation Oncology         [phone] ________________________________  Initial Outpatient Consultation - Conducted via Telephone due to current COVID-19 concerns for limiting patient exposure  Name: Carl Humphrey MRN: [id number]  Date: 06/08/2020  DOB: 26-Nov-1955  [id number], [facility]  Franchot Gallo, MD   REFERRING PHYSICIAN: Franchot Gallo, MD  DIAGNOSIS: 65 y.o. gentleman with Stage T1c adenocarcinoma of the prostate with Gleason score of 3+4, and PSA of 6.05.    ICD-10-CM   1. Malignant neoplasm of prostate ([place])  C61     HISTORY OF PRESENT ILLNESS: Carl Humphrey is a 65 y.o. Humphrey with a diagnosis of prostate cancer. He has previously been followed by Dr. McDiarmid for urinary incontinence and had been noted to have an elevated PSA in the past, as high as 5.09 in 2020 but had declined prostate biopsy. More recently, he was noted to have an elevated PSA of 6.96 in 09/2019 by his primary care physician, Dr. Shelia Media.  Accordingly, he was referred to Dr. Diona Fanti in Urology, for further evaluation on 11/12/2019,  digital rectal examination was performed at that time revealing slight irregularity but seemingly benign.  A repeat PSA with Dr. Pennie Banter office in 12/2019 showed a slight decrease but remained elevated at 6.05. The patient proceeded to transrectal ultrasound with 12 biopsies of the prostate on 04/09/2020.  The prostate volume measured 19.76 cc.  Out of 12 core biopsies, 7 were positive.  The maximum Gleason score was 3+4, and this was seen in the right mid lateral, left mid, left base, left apex lateral, and left mid lateral. Additionally, small foci of Gleason 3+3 was seen in the left apex and right apex.  The patient reviewed the biopsy results with his urologist and he has kindly been referred today for discussion of potential radiation treatment options.   PREVIOUS RADIATION THERAPY: No  PAST MEDICAL HISTORY:  Past Medical History:  Diagnosis Date    Anxiety    Arthritis    CAD (coronary artery disease)    a. nonobstructive by CT 2019.   Chronic diastolic CHF (congestive heart failure) (HCC)    Dysrhythmia    ETOH abuse    Former tobacco use    GERD (gastroesophageal reflux disease)    Hypertensive heart disease    Hypertriglyceridemia    Hypothyroidism    Obesity    Obstructive sleep apnea    a. On CPAP x ~ 10 yrs.   PAF (paroxysmal atrial fibrillation) ([place])    a. Dx 08/13/2015, without recurrence.   Pneumonia    Prostate cancer ([place])       PAST SURGICAL HISTORY: Past Surgical History:  Procedure Laterality Date   COLONOSCOPY     NO PAST SURGERIES     no prior surgery     PROSTATE BIOPSY     TOTAL HIP ARTHROPLASTY Right 11/20/2018   Procedure: TOTAL HIP ARTHROPLASTY ANTERIOR APPROACH;  Surgeon: [name], MD;  Location: [facility];  Service: Orthopedics;  Laterality: Right;  168min    FAMILY HISTORY:  Family History  Problem Relation Age of Onset   Hypertension Mother        alive @ 32   Hypertension Father        died of heart failure @ 70   Lung cancer Father        smoker   Multiple sclerosis Sister        deceased   Other Sister        A & W  Other Sister        A & W   Colon cancer Neg Hx    Heart attack Neg Hx    Stroke Neg Hx     SOCIAL HISTORY:  Social History   Socioeconomic History   Marital status: Married    Spouse name: Not on file   Number of children: 2   Years of education: Not on file   Highest education level: Not on file  Occupational History   Occupation: self employed  Tobacco Use   Smoking status: Former Smoker    Packs/day: 2.00    Years: 15.00    Pack years: 30.00    Types: Cigarettes    Quit date: 09/18/1989    Years since quitting: 30.7   Smokeless tobacco: Former Systems developer    Types: [place] date: 09/18/1997  Vaping Use   Vaping Use: Never used  Substance and Sexual Activity   Alcohol use: Yes    Alcohol/week: 2.0 standard  drinks    Types: 1 Cans of beer, 1 Shots of liquor per week    Comment: He drinks 8-10 beers/day.   Drug use: Not Currently    Types: Marijuana    Comment:  marijuana 1 x month   Sexual activity: Yes  Other Topics Concern   Not on file  Social History Narrative   Lives in [place] with his wife.  He walks his dog about 2.5 miles/day w/o limitations.  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   Social Determinants of Health   Financial Resource Strain: Not on file  Food Insecurity: Not on file  Transportation Needs: Not on file  Physical Activity: Not on file  Stress: Not on file  Social Connections: Not on file  Intimate Partner Violence: Not on file    ALLERGIES: Pravastatin and Rosuvastatin  MEDICATIONS:  Current Outpatient Medications  Medication Sig Dispense Refill   albuterol (VENTOLIN HFA) 108 (90 Base) MCG/ACT inhaler Inhale 2 puffs into the lungs every 6 (six) hours as needed.     ALPRAZolam (XANAX) 0.5 MG tablet Take 0.25-0.5 mg by mouth daily as needed for anxiety.   0   ARIPiprazole (ABILIFY) 2 MG tablet Take 1 tablet by mouth daily.     aspirin 81 MG EC tablet 1 tablet     cholecalciferol (VITAMIN D) 25 MCG (1000 UNIT) tablet 1 capsule     fenofibrate 160 MG tablet Take 160 mg by mouth daily.      folic acid (FOLVITE) 1 MG tablet 1 tablet     furosemide (LASIX) 40 MG tablet TAKE 1 TABLET BY MOUTH EVERY DAY 90 tablet 3   icosapent Ethyl (VASCEPA) 1 g capsule Take 2 capsules (2 g total) by mouth 2 (two) times daily. 360 capsule 3   levothyroxine (SYNTHROID, LEVOTHROID) 88 MCG tablet Take 88 mcg by mouth daily before breakfast.   0   meloxicam (MOBIC) 15 MG tablet Take 15 mg by mouth daily.     metoprolol tartrate (LOPRESSOR) 50 MG tablet TAKE 1 TABLET BY MOUTH TWICE A DAY 180 tablet 3   Multiple Vitamin (MULTI-VITAMIN DAILY PO) 1 tablet     Omega-3 Fatty Acids (FISH OIL) 1000 MG CAPS 2 capsule     oxymetazoline (AFRIN) 0.05  % nasal spray Place 1 spray into both nostrils 2 (two) times daily. (Patient taking differently: Place 1 spray into both nostrils 2 (two) times daily as needed for congestion.) 30 mL 0  potassium chloride (KLOR-CON) 10 MEQ tablet 1 tablet     PRALUENT 150 MG/ML SOAJ INJECT 150 MG INTO THE SKIN EVERY 14 DAYS. 2 pen 11   thiamine 100 MG tablet 1 tablet     valsartan (DIOVAN) 320 MG tablet Take 320 mg by mouth daily.     venlafaxine XR (EFFEXOR-XR) 150 MG 24 hr capsule Take 300 mg by mouth daily.   4   zolpidem (AMBIEN) 10 MG tablet Take 10 mg by mouth at bedtime.  0   pantoprazole (PROTONIX) 40 MG tablet pantoprazole 40 mg tablet,delayed release  TAKE 1 TABLET BY MOUTH EVERY DAY (Patient not taking: Reported on 06/08/2020)     No current facility-administered medications for this encounter.    REVIEW OF SYSTEMS:  On review of systems, the patient reports that he is doing well overall. He denies any chest pain, shortness of breath, cough, fevers, chills, night sweats, unintended weight changes. He denies any bowel disturbances, and denies abdominal pain, nausea or vomiting. He denies any new musculoskeletal or joint aches or pains. His IPSS was 14, indicating moderate urinary symptoms. He reports intermittent mild dysuria in the last 4 weeks and occasional urinary leakage. His SHIM was 1, indicating he has severe erectile dysfunction. A complete review of systems is obtained and is otherwise negative.    PHYSICAL EXAM:  Wt Readings from Last 3 Encounters:  06/08/20 222 lb (100.7 kg)  04/21/20 237 lb 6.4 oz (107.7 kg)  05/06/19 239 lb 12.8 oz (108.8 kg)   Temp Readings from Last 3 Encounters:  06/13/19 (!) 97.5 F (36.4 C) (Oral)  11/21/18 97.7 F (36.5 C) (Oral)  11/13/18 98.4 F (36.9 C) (Oral)   BP Readings from Last 3 Encounters:  04/21/20 124/76  06/13/19 134/79  05/06/19 120/64   Pulse Readings from Last 3 Encounters:  04/21/20 62  06/13/19 (!) 52  05/06/19 72   Pain  Assessment Pain Score: 0-No pain/10  Physical exam not performed in light of telephone consult visit format.   KPS = 90  100 - Normal; no complaints; no evidence of disease. 90   - Able to carry on normal activity; minor signs or symptoms of disease. 80   - Normal activity with effort; some signs or symptoms of disease. 69   - Cares for self; unable to carry on normal activity or to do active work. 60   - Requires occasional assistance, but is able to care for most of his personal needs. 50   - Requires considerable assistance and frequent medical care. 66   - Disabled; requires special care and assistance. 40   - Severely disabled; hospital admission is indicated although death not imminent. 39   - Very sick; hospital admission necessary; active supportive treatment necessary. 10   - Moribund; fatal processes progressing rapidly. 0     - Dead  Karnofsky DA, Abelmann [place], Craver LS and Burchenal [facility] [phone]) The use of the nitrogen mustards in the palliative treatment of carcinoma: with particular reference to bronchogenic carcinoma Cancer 1 [phone]  LABORATORY DATA:  Lab Results  Component Value Date   WBC 5.5 06/13/2019   HGB 13.7 06/13/2019   HCT 44.5 06/13/2019   MCV 91.8 06/13/2019   PLT 208 06/13/2019   Lab Results  Component Value Date   NA 138 06/13/2019   K 4.3 06/13/2019   CL 101 06/13/2019   CO2 27 06/13/2019   Lab Results  Component Value Date   ALT 18 05/06/2019  AST 21 05/06/2019   ALKPHOS 66 05/06/2019   BILITOT 0.2 05/06/2019     RADIOGRAPHY: No results found.    IMPRESSION/PLAN: This visit was conducted via Telephone to spare the patient unnecessary potential exposure in the healthcare setting during the current COVID-19 pandemic. 1. 65 y.o. gentleman with Stage T1c adenocarcinoma of the prostate with Gleason Score of 3+4, and PSA of 6.05. We discussed the patient's workup and outlined the nature of prostate cancer in this setting. The patient's T stage,  Gleason's score, and PSA put him into the favorable intermediate risk group. Accordingly, he is eligible for a variety of potential treatment options including brachytherapy, 5.5 weeks of external radiation, or prostatectomy. We discussed the available radiation techniques, and focused on the details and logistics and delivery. We discussed and outlined the risks, benefits, short and long-term effects associated with radiotherapy and compared and contrasted these with prostatectomy. We discussed the role of SpaceOAR in reducing the rectal toxicity associated with radiotherapy. He appears to have a good understanding of his disease and our treatment recommendations which are of curative intent.  He was encouraged to ask questions that were answered to his stated satisfaction.  At the end of the conversation, the patient is interested in moving forward with brachytherapy and use of SpaceOAR gel to reduce rectal toxicity from radiotherapy.  We will share our discussion with Dr. Diona Fanti and move forward with scheduling his CT [facility] planning appointment in the near future.  The patient will be contacted by Romie Jumper in our office who will be working closely with him to coordinate OR scheduling and pre and post procedure appointments.  We will contact the pharmaceutical rep to ensure that Carl Humphrey is available at the time of procedure.  We enjoyed meeting him today and look forward to continuing to participate in his care.   Given current concerns for patient exposure during the COVID-19 pandemic, this encounter was conducted via telephone. The patient was notified in advance and was offered a MyChart meeting to allow for face to face communication but unfortunately reported that he did not have the appropriate resources/technology to support such a visit and instead preferred to proceed with telephone consult. The patient has given verbal consent for this type of encounter. The time spent during this  encounter was 60 minutes. The attendants for this meeting include Tyler Pita MD, Ashlyn Bruning PA-C, Katie Daubenspeck- scribe, and patient Carl Humphrey and his wife. During the encounter, Tyler Pita MD, Ashlyn Bruning PA-C, and scribe, Wilburn Mylar were located at [place].  Patient Carl Humphrey and his wife were located at home.    Nicholos Johns, PA-C    Tyler Pita, MD  [facility] Direct Dial: [phone]   Fax: [phone] [email or web address]   Skype   LinkedIn  This document serves as a record of services personally performed by Tyler Pita, MD and [name], PA-C. It was created on their behalf by Wilburn Mylar, a trained medical scribe. The creation of this record is based on the scribe's personal observations and the provider's statements to them. This document has been checked and approved by the attending provider.

## 2020-06-10 DIAGNOSIS — C61 Malignant neoplasm of prostate: Secondary | ICD-10-CM | POA: Insufficient documentation

## 2020-06-11 ENCOUNTER — Telehealth: Payer: Self-pay | Admitting: *Deleted

## 2020-06-11 NOTE — Telephone Encounter (Signed)
CALLED PATIENT TO ASK QUESTIONS, UNABLE TO LEAVE MESSAGE DUE TO VM BEING FULL

## 2020-06-15 ENCOUNTER — Other Ambulatory Visit: Payer: Self-pay | Admitting: Urology

## 2020-06-16 ENCOUNTER — Telehealth: Payer: Self-pay | Admitting: *Deleted

## 2020-06-16 NOTE — Telephone Encounter (Signed)
Called patient to inform of pre-seed appts. for 07-08-20 and his implant on 08-09-20 @ 7:30 am, spoke with patient and he is aware of these appts.

## 2020-06-25 ENCOUNTER — Encounter: Payer: Self-pay | Admitting: Medical Oncology

## 2020-06-25 NOTE — Progress Notes (Signed)
Left message to introduce myself as the prostate nurse navigator and discuss my role. I was unable to meet him 1/18, when he consulted with Dr. Tammi Klippel. He has chosen brachytherapy to treat his cancer and is scheduled for CT simulation 2/11. I gave him my contact information and asked him to call me back with questions or concerns.

## 2020-07-07 ENCOUNTER — Telehealth: Payer: Self-pay | Admitting: *Deleted

## 2020-07-07 NOTE — Telephone Encounter (Signed)
CALLED PATIENT TO REMIND OF PRE-SEED APPTS. FOR 07-08-20, SPOKE WITH PATIENT AND HE IS AWARE OF THESE APPTS.

## 2020-07-08 ENCOUNTER — Ambulatory Visit
Admission: RE | Admit: 2020-07-08 | Discharge: 2020-07-08 | Disposition: A | Discharge status: Home / Self Care | Payer: BC Managed Care – PPO | Source: Ambulatory Visit | Attending: Urology | Admitting: Urology

## 2020-07-08 ENCOUNTER — Ambulatory Visit
Admission: RE | Admit: 2020-07-08 | Discharge: 2020-07-08 | Disposition: A | Discharge status: Home / Self Care | Payer: BC Managed Care – PPO | Source: Ambulatory Visit | Attending: Radiation Oncology | Admitting: Radiation Oncology

## 2020-07-08 ENCOUNTER — Encounter (HOSPITAL_COMMUNITY)
Admission: RE | Admit: 2020-07-08 | Discharge: 2020-07-08 | Disposition: A | Discharge status: Home / Self Care | Payer: BC Managed Care – PPO | Source: Ambulatory Visit | Attending: Urology | Admitting: Urology

## 2020-07-08 ENCOUNTER — Ambulatory Visit (HOSPITAL_COMMUNITY)
Admission: RE | Admit: 2020-07-08 | Discharge: 2020-07-08 | Disposition: A | Discharge status: Home / Self Care | Payer: BC Managed Care – PPO | Source: Ambulatory Visit | Attending: Urology | Admitting: Urology

## 2020-07-08 ENCOUNTER — Other Ambulatory Visit: Payer: Self-pay

## 2020-07-08 DIAGNOSIS — Z01818 Encounter for other preprocedural examination: Secondary | ICD-10-CM | POA: Diagnosis present

## 2020-07-08 DIAGNOSIS — C61 Malignant neoplasm of prostate: Secondary | ICD-10-CM | POA: Diagnosis present

## 2020-07-08 DIAGNOSIS — Z510 Encounter for antineoplastic radiation therapy: Secondary | ICD-10-CM | POA: Insufficient documentation

## 2020-07-08 NOTE — Progress Notes (Signed)
  Radiation Oncology         [phone] ________________________________  Name: Carl Humphrey MRN: [id number]  Date: 07/08/2020  DOB: November 15, 1955  SIMULATION AND TREATMENT PLANNING NOTE PUBIC ARCH STUDY  AE:[id number], [facility]  Franchot Gallo, MD  DIAGNOSIS: 65 y.o. gentleman with Stage T1c adenocarcinoma of the prostate with Gleason score of 3+4, and PSA of 6.05.  Oncology History  Malignant neoplasm of prostate ([place])  04/09/2020 Cancer Staging   Staging form: Prostate, AJCC 8th Edition - Clinical stage from 04/09/2020: Stage IIB (cT1c, cN0, cM0, PSA: 6.1, Grade Group: 2) - Signed by [name], PA-C on 06/10/2020   06/10/2020 Initial Diagnosis   Malignant neoplasm of prostate ([place])       ICD-10-CM   1. Malignant neoplasm of prostate ([place])  C61     COMPLEX SIMULATION:  The patient presented today for evaluation for possible prostate seed implant. He was brought to the radiation planning suite and placed supine on the CT couch. A 3-dimensional image study set was obtained in upload to the planning computer. There, on each axial slice, I contoured the prostate gland. Then, using three-dimensional radiation planning tools I reconstructed the prostate in view of the structures from the transperineal needle pathway to assess for possible pubic arch interference. In doing so, I did not appreciate any pubic arch interference. Also, the patient's prostate volume was estimated based on the drawn structure. The volume was 19 cc.  Given the pubic arch appearance and prostate volume, patient remains a good candidate to proceed with prostate seed implant. Today, he freely provided informed written consent to proceed.    PLAN: The patient will undergo prostate seed implant.   ________________________________  Sheral Apley. Tammi Klippel, M.D.

## 2020-07-27 ENCOUNTER — Other Ambulatory Visit: Payer: Self-pay | Admitting: Cardiology

## 2020-07-27 DIAGNOSIS — E7849 Other hyperlipidemia: Secondary | ICD-10-CM

## 2020-08-03 ENCOUNTER — Encounter (HOSPITAL_BASED_OUTPATIENT_CLINIC_OR_DEPARTMENT_OTHER): Payer: Self-pay | Admitting: Urology

## 2020-08-03 ENCOUNTER — Other Ambulatory Visit: Payer: Self-pay

## 2020-08-03 NOTE — Progress Notes (Signed)
Spoke w/ via phone for pre-op interview---pt and wife judy Lab needs dos----     none          Lab results------has lab appt 08-06-2020 1030 for cbc cmp pt ptt COVID test ------08-06-2020 1200 Arrive at -------530 am 08-09-2020 NPO after MN NO Solid Food.  Clear liquids from MN until---430 am then npo Med rec completed Medications to take morning of surgery -----metoprolol tartrate, albuterol inhaler prn/bring inhaler, alprazolam prn, abilify, fenofibrate, levothyroxine, pantapazole, venlafaxine Diabetic medication -----n/a Patient instructed to bring photo id and insurance card day of surgery Patient aware to have Driver (ride ) / caregiver driver wife lynn will stay    for 24 hours after surgery  Patient Special Instructions -----fleets enema am of surgeyr Pre-Op special Istructions -----none Patient verbalized understanding of instructions that were given at this phone interview. Patient denies shortness of breath, chest pain, fever, cough at this phone interview.  ekg 07-01-2019 epic Chest xray 07-08-2020 epic Echo 09-23-2017 epic lov cardiology 04-21-2020 epic cardiologist dr Meda Coffee Pt to stop 81 mg aspirin per dr Diona Fanti instructions

## 2020-08-04 ENCOUNTER — Telehealth: Payer: Self-pay | Admitting: *Deleted

## 2020-08-04 NOTE — Telephone Encounter (Signed)
CALLED PATIENT TO REMIND OF LAB AND COVID TESTING FOR 08-06-20, LVM FOR A RETURN CALL

## 2020-08-06 ENCOUNTER — Other Ambulatory Visit (HOSPITAL_COMMUNITY)
Admission: RE | Admit: 2020-08-06 | Discharge: 2020-08-06 | Disposition: A | Discharge status: Home / Self Care | Payer: BC Managed Care – PPO | Source: Ambulatory Visit | Attending: Urology | Admitting: Urology

## 2020-08-06 ENCOUNTER — Other Ambulatory Visit: Payer: Self-pay

## 2020-08-06 ENCOUNTER — Telehealth: Payer: Self-pay | Admitting: *Deleted

## 2020-08-06 ENCOUNTER — Encounter (HOSPITAL_COMMUNITY)
Admission: RE | Admit: 2020-08-06 | Discharge: 2020-08-06 | Disposition: A | Discharge status: Home / Self Care | Payer: BC Managed Care – PPO | Source: Ambulatory Visit | Attending: Urology | Admitting: Urology

## 2020-08-06 DIAGNOSIS — Z8269 Family history of other diseases of the musculoskeletal system and connective tissue: Secondary | ICD-10-CM | POA: Diagnosis not present

## 2020-08-06 DIAGNOSIS — Z01812 Encounter for preprocedural laboratory examination: Secondary | ICD-10-CM | POA: Insufficient documentation

## 2020-08-06 DIAGNOSIS — Z96641 Presence of right artificial hip joint: Secondary | ICD-10-CM | POA: Diagnosis not present

## 2020-08-06 DIAGNOSIS — Z8616 Personal history of COVID-19: Secondary | ICD-10-CM | POA: Diagnosis not present

## 2020-08-06 DIAGNOSIS — Z87891 Personal history of nicotine dependence: Secondary | ICD-10-CM | POA: Diagnosis not present

## 2020-08-06 DIAGNOSIS — Z801 Family history of malignant neoplasm of trachea, bronchus and lung: Secondary | ICD-10-CM | POA: Diagnosis not present

## 2020-08-06 DIAGNOSIS — Z8249 Family history of ischemic heart disease and other diseases of the circulatory system: Secondary | ICD-10-CM | POA: Diagnosis not present

## 2020-08-06 DIAGNOSIS — Z888 Allergy status to other drugs, medicaments and biological substances status: Secondary | ICD-10-CM | POA: Diagnosis not present

## 2020-08-06 DIAGNOSIS — Z20822 Contact with and (suspected) exposure to covid-19: Secondary | ICD-10-CM | POA: Insufficient documentation

## 2020-08-06 DIAGNOSIS — C61 Malignant neoplasm of prostate: Secondary | ICD-10-CM | POA: Diagnosis present

## 2020-08-06 LAB — COMPREHENSIVE METABOLIC PANEL
ALT: 25 U/L (ref 0–44)
AST: 22 U/L (ref 15–41)
Albumin: 4 g/dL (ref 3.5–5.0)
Alkaline Phosphatase: 38 U/L (ref 38–126)
Anion gap: 11 (ref 5–15)
BUN: 32 mg/dL — ABNORMAL HIGH (ref 8–23)
CO2: 25 mmol/L (ref 22–32)
Calcium: 9.3 mg/dL (ref 8.9–10.3)
Chloride: 104 mmol/L (ref 98–111)
Creatinine, Ser: 1.31 mg/dL — ABNORMAL HIGH (ref 0.61–1.24)
GFR, Estimated: >60 mL/min (ref >60)
Glucose, Bld: 132 mg/dL — ABNORMAL HIGH (ref 70–99)
Potassium: 4.2 mmol/L (ref 3.5–5.1)
Sodium: 140 mmol/L (ref 135–145)
Total Bilirubin: 0.9 mg/dL (ref 0.3–1.2)
Total Protein: 7 g/dL (ref 6.5–8.1)

## 2020-08-06 LAB — PROTIME-INR
INR: 1.5 — ABNORMAL HIGH (ref 0.8–1.2)
Prothrombin Time: 17.4 seconds — ABNORMAL HIGH (ref 11.4–15.2)

## 2020-08-06 LAB — CBC
HCT: 48.4 % (ref 39.0–52.0)
Hemoglobin: 16 g/dL (ref 13.0–17.0)
MCH: 29.4 pg (ref 26.0–34.0)
MCHC: 33.1 g/dL (ref 30.0–36.0)
MCV: 88.8 fL (ref 80.0–100.0)
Platelets: 192 10*3/uL (ref 150–400)
RBC: 5.45 MIL/uL (ref 4.22–5.81)
RDW: 13.9 % (ref 11.5–15.5)
WBC: 8.4 10*3/uL (ref 4.0–10.5)
nRBC: 0 % (ref 0.0–0.2)

## 2020-08-06 LAB — APTT: aPTT: 34 seconds (ref 24–36)

## 2020-08-06 LAB — SARS CORONAVIRUS 2 (TAT 6-24 HRS): SARS Coronavirus 2: NEGATIVE

## 2020-08-06 NOTE — Progress Notes (Signed)
Routed abnormal PT/INR result done today to Dr Diona Fanti.

## 2020-08-06 NOTE — Telephone Encounter (Signed)
CALLED PATIENT TO REMIND OF PROCEDURE FOR 08-09-20, SPOKE WITH PATIENT AND HE IS AWARE OF THIS PROCEDURE

## 2020-08-08 NOTE — Anesthesia Preprocedure Evaluation (Addendum)
Anesthesia Evaluation  Patient identified by MRN, date of birth, ID band Patient awake    Reviewed: Allergy & Precautions, NPO status , Patient's Chart, lab work & pertinent test results  Airway Mallampati: III  TM Distance: >3 FB Neck ROM: Limited   Comment: Redundant neck tissue Dental no notable dental hx.    Pulmonary sleep apnea and Continuous Positive Airway Pressure Ventilation , former smoker,    Pulmonary exam normal breath sounds clear to auscultation       Cardiovascular hypertension, +CHF  Normal cardiovascular exam+ dysrhythmias Atrial Fibrillation  Rhythm:Regular Rate:Normal  09-Aug-2020 06:39:19 [place] ROUTINE RECORD Normal sinus rhythm Possible Left atrial enlargement Left axis deviation Incomplete right bundle branch block Abnormal ECG   Neuro/Psych  Headaches, PSYCHIATRIC DISORDERS Anxiety Depression    GI/Hepatic GERD  ,INR 1.5   Endo/Other  Hypothyroidism prediagetes  Renal/GU Renal InsufficiencyRenal disease (Cr 1.3)  negative genitourinary   Musculoskeletal  (+) Arthritis ,   Abdominal   Peds  Hematology negative hematology ROS (+)   Anesthesia Other Findings Prostate ca  Reproductive/Obstetrics negative OB ROS                            Anesthesia Physical Anesthesia Plan  ASA: III  Anesthesia Plan: General   Post-op Pain Management:    Induction: Intravenous  PONV Risk Score and Plan: 2  Airway Management Planned: LMA  Additional Equipment: None  Intra-op Plan:   Post-operative Plan: Extubation in OR  Informed Consent: I have reviewed the patients History and Physical, chart, labs and discussed the procedure including the risks, benefits and alternatives for the proposed anesthesia with the patient or authorized representative who has indicated his/her understanding and acceptance.     Dental advisory given  Plan  Discussed with: CRNA, Anesthesiologist and Surgeon  Anesthesia Plan Comments: (GA/LMA. GETA as backup plan (glidescope available if intubation needed). Norton Blizzard, MD  )       Anesthesia Quick Evaluation

## 2020-08-09 ENCOUNTER — Encounter (HOSPITAL_BASED_OUTPATIENT_CLINIC_OR_DEPARTMENT_OTHER): Payer: Self-pay | Admitting: Urology

## 2020-08-09 ENCOUNTER — Other Ambulatory Visit: Payer: Self-pay

## 2020-08-09 ENCOUNTER — Ambulatory Visit (HOSPITAL_COMMUNITY): Payer: BC Managed Care – PPO

## 2020-08-09 ENCOUNTER — Ambulatory Visit (HOSPITAL_BASED_OUTPATIENT_CLINIC_OR_DEPARTMENT_OTHER)
Admission: RE | Admit: 2020-08-09 | Discharge: 2020-08-09 | Disposition: A | Discharge status: Home / Self Care | Payer: BC Managed Care – PPO | Source: Ambulatory Visit | Attending: Urology | Admitting: Urology

## 2020-08-09 ENCOUNTER — Encounter (HOSPITAL_BASED_OUTPATIENT_CLINIC_OR_DEPARTMENT_OTHER)
Admission: RE | Disposition: A | Discharge status: Home / Self Care | Payer: Self-pay | Source: Ambulatory Visit | Attending: Urology

## 2020-08-09 ENCOUNTER — Ambulatory Visit (HOSPITAL_BASED_OUTPATIENT_CLINIC_OR_DEPARTMENT_OTHER): Payer: BC Managed Care – PPO | Admitting: Anesthesiology

## 2020-08-09 DIAGNOSIS — Z8616 Personal history of COVID-19: Secondary | ICD-10-CM | POA: Insufficient documentation

## 2020-08-09 DIAGNOSIS — Z01818 Encounter for other preprocedural examination: Secondary | ICD-10-CM

## 2020-08-09 DIAGNOSIS — C61 Malignant neoplasm of prostate: Secondary | ICD-10-CM | POA: Diagnosis not present

## 2020-08-09 DIAGNOSIS — Z888 Allergy status to other drugs, medicaments and biological substances status: Secondary | ICD-10-CM | POA: Insufficient documentation

## 2020-08-09 DIAGNOSIS — Z801 Family history of malignant neoplasm of trachea, bronchus and lung: Secondary | ICD-10-CM | POA: Insufficient documentation

## 2020-08-09 DIAGNOSIS — Z8269 Family history of other diseases of the musculoskeletal system and connective tissue: Secondary | ICD-10-CM | POA: Insufficient documentation

## 2020-08-09 DIAGNOSIS — Z87891 Personal history of nicotine dependence: Secondary | ICD-10-CM | POA: Insufficient documentation

## 2020-08-09 DIAGNOSIS — Z8249 Family history of ischemic heart disease and other diseases of the circulatory system: Secondary | ICD-10-CM | POA: Insufficient documentation

## 2020-08-09 DIAGNOSIS — Z96641 Presence of right artificial hip joint: Secondary | ICD-10-CM | POA: Insufficient documentation

## 2020-08-09 HISTORY — DX: Headache, unspecified: R51.9

## 2020-08-09 HISTORY — DX: Prediabetes: R73.03

## 2020-08-09 HISTORY — DX: COVID-19: U07.1

## 2020-08-09 HISTORY — DX: Presence of spectacles and contact lenses: Z97.3

## 2020-08-09 HISTORY — DX: Depression, unspecified: F32.A

## 2020-08-09 HISTORY — PX: RADIOACTIVE SEED IMPLANT: SHX5150

## 2020-08-09 SURGERY — INSERTION, RADIATION SOURCE, PROSTATE
Anesthesia: General

## 2020-08-09 MED ORDER — ONDANSETRON HCL 4 MG/2ML IJ SOLN: 4.0000 mg | Freq: Once | INTRAMUSCULAR | Status: DC | PRN

## 2020-08-09 MED ORDER — LIDOCAINE 2% (20 MG/ML) 5 ML SYRINGE
INTRAMUSCULAR | Status: AC
  Filled 2020-08-09: qty 5

## 2020-08-09 MED ORDER — MIDAZOLAM HCL 2 MG/2ML IJ SOLN
INTRAMUSCULAR | Status: DC | PRN
  Administered 2020-08-09: 2 mg via INTRAVENOUS

## 2020-08-09 MED ORDER — CEFAZOLIN SODIUM-DEXTROSE 2-4 GM/100ML-% IV SOLN
INTRAVENOUS | Status: AC
  Filled 2020-08-09: qty 100

## 2020-08-09 MED ORDER — MIDAZOLAM HCL 2 MG/2ML IJ SOLN
INTRAMUSCULAR | Status: AC
  Filled 2020-08-09: qty 2

## 2020-08-09 MED ORDER — EPHEDRINE SULFATE-NACL 50-0.9 MG/10ML-% IV SOSY
PREFILLED_SYRINGE | INTRAVENOUS | Status: DC | PRN
  Administered 2020-08-09: 10 mg via INTRAVENOUS
  Administered 2020-08-09: 5 mg via INTRAVENOUS
  Administered 2020-08-09: 15 mg via INTRAVENOUS

## 2020-08-09 MED ORDER — TRAMADOL HCL 50 MG PO TABS: 50.0000 mg | ORAL_TABLET | Freq: Four times a day (QID) | ORAL | 0 refills | Status: DC | PRN

## 2020-08-09 MED ORDER — FENTANYL CITRATE (PF) 100 MCG/2ML IJ SOLN
INTRAMUSCULAR | Status: AC
  Filled 2020-08-09: qty 2

## 2020-08-09 MED ORDER — PHENYLEPHRINE HCL (PRESSORS) 10 MG/ML IV SOLN
INTRAVENOUS | Status: AC
  Filled 2020-08-09: qty 2

## 2020-08-09 MED ORDER — OXYCODONE HCL 5 MG PO TABS
5.0000 mg | ORAL_TABLET | Freq: Once | ORAL | Status: DC | PRN
Start: 2020-08-09 — End: 2020-08-09

## 2020-08-09 MED ORDER — SODIUM CHLORIDE 0.9 % IV SOLN
INTRAVENOUS | Status: AC | PRN
  Administered 2020-08-09: 1000 mL

## 2020-08-09 MED ORDER — ACETAMINOPHEN 500 MG PO TABS
1000.0000 mg | ORAL_TABLET | Freq: Once | ORAL | Status: AC
  Administered 2020-08-09: 1000 mg via ORAL

## 2020-08-09 MED ORDER — ONDANSETRON HCL 4 MG/2ML IJ SOLN
INTRAMUSCULAR | Status: AC
  Filled 2020-08-09: qty 2

## 2020-08-09 MED ORDER — FLEET ENEMA 7-19 GM/118ML RE ENEM: 1.0000 | ENEMA | Freq: Once | RECTAL | Status: DC

## 2020-08-09 MED ORDER — AMISULPRIDE (ANTIEMETIC) 5 MG/2ML IV SOLN: 10.0000 mg | Freq: Once | INTRAVENOUS | Status: DC | PRN

## 2020-08-09 MED ORDER — LACTATED RINGERS IV SOLN: INTRAVENOUS | Status: DC

## 2020-08-09 MED ORDER — PHENYLEPHRINE 40 MCG/ML (10ML) SYRINGE FOR IV PUSH (FOR BLOOD PRESSURE SUPPORT)
PREFILLED_SYRINGE | INTRAVENOUS | Status: DC | PRN
  Administered 2020-08-09 (×2): 80 ug via INTRAVENOUS
  Administered 2020-08-09: 40 ug via INTRAVENOUS
  Administered 2020-08-09: 80 ug via INTRAVENOUS
  Administered 2020-08-09: 40 ug via INTRAVENOUS

## 2020-08-09 MED ORDER — PROPOFOL 10 MG/ML IV BOLUS
INTRAVENOUS | Status: DC | PRN
  Administered 2020-08-09: 200 mg via INTRAVENOUS

## 2020-08-09 MED ORDER — CEFAZOLIN SODIUM-DEXTROSE 2-4 GM/100ML-% IV SOLN
2.0000 g | Freq: Once | INTRAVENOUS | Status: AC
  Administered 2020-08-09: 2 g via INTRAVENOUS

## 2020-08-09 MED ORDER — ONDANSETRON HCL 4 MG/2ML IJ SOLN
INTRAMUSCULAR | Status: DC | PRN
  Administered 2020-08-09: 4 mg via INTRAVENOUS

## 2020-08-09 MED ORDER — LIDOCAINE 2% (20 MG/ML) 5 ML SYRINGE
INTRAMUSCULAR | Status: DC | PRN
  Administered 2020-08-09: 80 mg via INTRAVENOUS

## 2020-08-09 MED ORDER — FENTANYL CITRATE (PF) 100 MCG/2ML IJ SOLN
INTRAMUSCULAR | Status: DC | PRN
  Administered 2020-08-09: 50 ug via INTRAVENOUS
  Administered 2020-08-09: 25 ug via INTRAVENOUS

## 2020-08-09 MED ORDER — DEXAMETHASONE SODIUM PHOSPHATE 10 MG/ML IJ SOLN
INTRAMUSCULAR | Status: AC
  Filled 2020-08-09: qty 1

## 2020-08-09 MED ORDER — OXYCODONE HCL 5 MG/5ML PO SOLN: 5.0000 mg | Freq: Once | ORAL | Status: DC | PRN

## 2020-08-09 MED ORDER — DEXAMETHASONE SODIUM PHOSPHATE 10 MG/ML IJ SOLN
INTRAMUSCULAR | Status: DC | PRN
  Administered 2020-08-09: 5 mg via INTRAVENOUS

## 2020-08-09 MED ORDER — SODIUM CHLORIDE (PF) 0.9 % IJ SOLN: INTRAMUSCULAR | Status: DC | PRN

## 2020-08-09 MED ORDER — ACETAMINOPHEN 500 MG PO TABS
ORAL_TABLET | ORAL | Status: AC
  Filled 2020-08-09: qty 2

## 2020-08-09 MED ORDER — IOHEXOL 300 MG/ML  SOLN
INTRAMUSCULAR | Status: DC | PRN
  Administered 2020-08-09: 7 mL

## 2020-08-09 MED ORDER — PROPOFOL 10 MG/ML IV BOLUS
INTRAVENOUS | Status: AC
  Filled 2020-08-09: qty 40

## 2020-08-09 MED ORDER — PHENYLEPHRINE 40 MCG/ML (10ML) SYRINGE FOR IV PUSH (FOR BLOOD PRESSURE SUPPORT)
PREFILLED_SYRINGE | INTRAVENOUS | Status: AC
  Filled 2020-08-09: qty 10

## 2020-08-09 MED ORDER — FENTANYL CITRATE (PF) 100 MCG/2ML IJ SOLN
25.0000 ug | INTRAMUSCULAR | Status: DC | PRN
Start: 2020-08-09 — End: 2020-08-09
  Administered 2020-08-09 (×2): 25 ug via INTRAVENOUS

## 2020-08-09 MED ORDER — STERILE WATER FOR IRRIGATION IR SOLN
Status: DC | PRN
  Administered 2020-08-09: 500 mL

## 2020-08-09 MED ORDER — EPHEDRINE 5 MG/ML INJ
INTRAVENOUS | Status: AC
  Filled 2020-08-09: qty 20

## 2020-08-09 SURGICAL SUPPLY — 35 items
BAG DECANTER FOR FLEXI CONT (MISCELLANEOUS) ×2 IMPLANT
BAG DRN RND TRDRP ANRFLXCHMBR (UROLOGICAL SUPPLIES) ×1
BAG URINE DRAIN 2000ML AR STRL (UROLOGICAL SUPPLIES) ×2 IMPLANT
BLADE CLIPPER SENSICLIP SURGIC (BLADE) ×2 IMPLANT
CATH FOLEY 2WAY SLVR  5CC 16FR (CATHETERS) ×2
CATH FOLEY 2WAY SLVR 5CC 16FR (CATHETERS) ×2 IMPLANT
CATH ROBINSON RED A/P 16FR (CATHETERS) IMPLANT
CATH ROBINSON RED A/P 20FR (CATHETERS) ×2 IMPLANT
CLOTH BEACON ORANGE TIMEOUT ST (SAFETY) ×2 IMPLANT
CNTNR URN SCR LID CUP LEK RST (MISCELLANEOUS) ×2 IMPLANT
CONT SPEC 4OZ STRL OR WHT (MISCELLANEOUS) ×4
COVER BACK TABLE 60X90IN (DRAPES) ×2 IMPLANT
COVER MAYO STAND STRL (DRAPES) ×2 IMPLANT
DRAPE C-ARM 35X43 STRL (DRAPES) ×2 IMPLANT
DRSG TEGADERM 4X4.75 (GAUZE/BANDAGES/DRESSINGS) ×2 IMPLANT
DRSG TEGADERM 8X12 (GAUZE/BANDAGES/DRESSINGS) ×2 IMPLANT
GAUZE SPONGE 4X4 12PLY STRL (GAUZE/BANDAGES/DRESSINGS) ×2 IMPLANT
GLOVE SURG ENC MOIS LTX SZ6.5 (GLOVE) ×2 IMPLANT
GLOVE SURG ENC MOIS LTX SZ7.5 (GLOVE) IMPLANT
GLOVE SURG ENC MOIS LTX SZ8 (GLOVE) ×4 IMPLANT
GLOVE SURG ORTHO LTX SZ8.5 (GLOVE) IMPLANT
GLOVE SURG POLYISO LF SZ6.5 (GLOVE) IMPLANT
GOWN STRL REUS W/TWL XL LVL3 (GOWN DISPOSABLE) ×2 IMPLANT
HOLDER FOLEY CATH W/STRAP (MISCELLANEOUS) ×2 IMPLANT
IV NS 1000ML (IV SOLUTION) ×2
IV NS 1000ML BAXH (IV SOLUTION) ×1 IMPLANT
KIT TURNOVER CYSTO (KITS) ×2 IMPLANT
MARKER SKIN DUAL TIP RULER LAB (MISCELLANEOUS) ×2 IMPLANT
PACK CYSTO (CUSTOM PROCEDURE TRAY) ×2 IMPLANT
SUT BONE WAX W31G (SUTURE) IMPLANT
SYR 10ML LL (SYRINGE) ×2 IMPLANT
TOWEL OR 17X26 10 PK STRL BLUE (TOWEL DISPOSABLE) ×2 IMPLANT
UNDERPAD 30X36 HEAVY ABSORB (UNDERPADS AND DIAPERS) ×4 IMPLANT
WATER STERILE IRR 500ML POUR (IV SOLUTION) ×2 IMPLANT
i-seed agx100 ×136 IMPLANT

## 2020-08-09 NOTE — Progress Notes (Signed)
Once patient entered OR, had to restart IV as it was lost when he was in the bathroom.

## 2020-08-09 NOTE — Progress Notes (Signed)
  Radiation Oncology         [phone] ________________________________  Name: Carl Humphrey MRN: [id number]  Date: 08/09/2020  DOB: 22-Jul-1955       Prostate Seed Implant  JY:NWGNF, Thayer Jew, MD  No ref. provider found  DIAGNOSIS:   65 y.o. gentleman with Stage T1c adenocarcinoma of the prostate with Gleason score of 3+4, and PSA of 6.05  Oncology History  Malignant neoplasm of prostate ([place])  04/09/2020 Cancer Staging   Staging form: Prostate, AJCC 8th Edition - Clinical stage from 04/09/2020: Stage IIB (cT1c, cN0, cM0, PSA: 6.1, Grade Group: 2) - Signed by [name], PA-C on 06/10/2020   06/10/2020 Initial Diagnosis   Malignant neoplasm of prostate ([place])       ICD-10-CM   1. Preop testing  Z01.818     PROCEDURE: Insertion of radioactive I-125 seeds into the prostate gland.  RADIATION DOSE: 145 Gy, definitive therapy.  TECHNIQUE: [name] was brought to the operating room with the urologist. He was placed in the dorsolithotomy position. He was catheterized and a rectal tube was inserted. The perineum was shaved, prepped and draped. The ultrasound probe was then introduced into the rectum to see the prostate gland.  TREATMENT DEVICE: A needle grid was attached to the ultrasound probe stand and anchor needles were placed.  3D PLANNING: The prostate was imaged in 3D using a sagittal sweep of the prostate probe. These images were transferred to the planning computer. There, the prostate, urethra and rectum were defined on each axial reconstructed image. Then, the software created an optimized 3D plan and a few seed positions were adjusted. The quality of the plan was reviewed using [facility] information for the target and the following two organs at risk:  Urethra and Rectum.  Then the accepted plan was printed and handed off to the radiation therapist.  Under my supervision, the custom loading of the seeds and spacers was carried out and loaded into sealed vicryl sleeves.  These  pre-loaded needles were then placed into the needle holder.[name]  PROSTATE VOLUME STUDY:  Using transrectal ultrasound the volume of the prostate was verified to be 23 cc.  SPECIAL TREATMENT PROCEDURE/SUPERVISION AND HANDLING: The pre-loaded needles were then delivered under sagittal guidance. A total of 15 needles were used to deposit 68 seeds in the prostate gland. The individual seed activity was 0.310 mCi.  SpaceOAR:  No  COMPLEX SIMULATION: At the end of the procedure, an anterior radiograph of the pelvis was obtained to document seed positioning and count. Cystoscopy was performed to check the urethra and bladder.  MICRODOSIMETRY: At the end of the procedure, the patient was emitting 0.05 mR/hr at 1 meter. Accordingly, he was considered safe for hospital discharge.  PLAN: The patient will return to the radiation oncology clinic for post implant CT dosimetry in three weeks.   ________________________________  Sheral Apley Tammi Klippel, M.D.

## 2020-08-09 NOTE — Anesthesia Postprocedure Evaluation (Signed)
Anesthesia Post Note  Patient: Carl Humphrey  Procedure(s) Performed: RADIOACTIVE SEED IMPLANT/BRACHYTHERAPY IMPLANT (N/A )     Patient location during evaluation: PACU Anesthesia Type: General Level of consciousness: awake and alert Pain management: pain level controlled Vital Signs Assessment: post-procedure vital signs reviewed and stable Respiratory status: spontaneous breathing, nonlabored ventilation and respiratory function stable Cardiovascular status: blood pressure returned to baseline and stable Postop Assessment: no apparent nausea or vomiting Anesthetic complications: no   No complications documented.  Last Vitals:  Vitals:   08/09/20 0945 08/09/20 1000  BP: (!) 141/84 138/85  Pulse: 71 69  Resp: 15 16  Temp:    SpO2: 94% 93%    Last Pain:  Vitals:   08/09/20 1000  TempSrc:   PainSc: 4                  Candra R Lyda Colcord

## 2020-08-09 NOTE — Discharge Instructions (Signed)
Radioactive Seed Implant Home Care Instructions   Activity:   Rest for the remainder of the day.  Do not drive or operate equipment today.  You may resume normal activities in a few days as instructed by your physician, without risk of harmful radiation exposure to those around you, provided you follow the time and distance precautions on the Radiation Oncology Instruction Sheet.   Meals:  Drink plenty of lipuids and eat light foods, such as gelatin or soup this evening .  You may return to normal meal plan tomorrow.  Return To Work: You may return to work as instructed by Naval architect.  Call your physician if any of these symptoms occur:   Persistent or heavy bleeding  Urine stream diminishes or stops completely after catheter is removed  Fever equal to or greater than 101 degrees F  Cloudy urine with a strong foul odor  Severe pain  You may feel some burning pain and/or hesitancy when you urinate after the catheter is removed.  These symptoms may increase over the next few weeks, but should diminish within forur to six weeks.  Applying moist heat to the lower abdomen or a hot tub bath may help relieve the pain.  If the discomfort becomes severe, please call your physician for additional medications.   Post Anesthesia Home Care Instructions  Activity: Get plenty of rest for the remainder of the day. A responsible individual must stay with you for 24 hours following the procedure.  For the next 24 hours, DO NOT: -Drive a car -Paediatric nurse -Drink alcoholic beverages -Take any medication unless instructed by your physician -Make any legal decisions or sign important papers.  Meals: Start with liquid foods such as gelatin or soup. Progress to regular foods as tolerated. Avoid greasy, spicy, heavy foods. If nausea and/or vomiting occur, drink only clear liquids until the nausea and/or vomiting subsides. Call your physician if vomiting continues.  Special  Instructions/Symptoms: Your throat may feel dry or sore from the anesthesia or the breathing tube placed in your throat during surgery. If this causes discomfort, gargle with warm salt water. The discomfort should disappear within 24 hours.

## 2020-08-09 NOTE — Transfer of Care (Signed)
Immediate Anesthesia Transfer of Care Note  Patient: Carl Humphrey  Procedure(s) Performed: Procedure(s) (LRB): RADIOACTIVE SEED IMPLANT/BRACHYTHERAPY IMPLANT (N/A)  Patient Location: PACU  Anesthesia Type: General  Level of Consciousness: awake, oriented, sedated and patient cooperative  Airway & Oxygen Therapy: Patient Spontanous Breathing and Patient connected to face mask oxygen  Post-op Assessment: Report given to PACU RN and Post -op Vital signs reviewed and stable  Post vital signs: Reviewed and stable  Complications: No apparent anesthesia complications  Last Vitals:  Vitals Value Taken Time  BP 122/65 08/09/20 1039  Temp 36.4 C 08/09/20 0920  Pulse 64 08/09/20 1039  Resp 17 08/09/20 1039  SpO2 97 % 08/09/20 1039    Last Pain:  Vitals:   08/09/20 1039  TempSrc:   PainSc: 0-No pain      Patients Stated Pain Goal: 5 (62/22/97 9892)  Complications: No complications documented.

## 2020-08-09 NOTE — Anesthesia Procedure Notes (Signed)
Procedure Name: LMA Insertion Date/Time: 08/09/2020 7:55 AM Performed by: Suan Halter, CRNA Pre-anesthesia Checklist: Patient identified, Emergency Drugs available, Suction available and Patient being monitored Patient Re-evaluated:Patient Re-evaluated prior to induction Oxygen Delivery Method: Circle system utilized Preoxygenation: Pre-oxygenation with 100% oxygen Induction Type: IV induction Ventilation: Mask ventilation without difficulty LMA: LMA inserted LMA Size: 4.0 Number of attempts: 1 Airway Equipment and Method: Bite block Placement Confirmation: positive ETCO2 Tube secured with: Tape Dental Injury: Teeth and Oropharynx as per pre-operative assessment

## 2020-08-09 NOTE — H&P (Signed)
H&P  Chief Complaint: PCa   History of Present Illness: 65 yo male presents for I 125 brachytherapy for PCa  Past Medical History:  Diagnosis Date  . Anxiety   . Arthritis    back lower pain at times  . CAD (coronary artery disease)    a. nonobstructive by CT 2019.  [name] Chronic diastolic CHF (congestive heart failure) ([place])   . COVID jan 2021, dec  2021   rapid home test dec 2021 test due to exposure no symptoms  . Depression   . Dysrhythmia    AFIB  . ETOH abuse   . Former tobacco use   . GERD (gastroesophageal reflux disease)   . Headache   . Hypertensive heart disease   . Hypertriglyceridemia   . Hypothyroidism   . Obesity   . Obstructive sleep apnea    a. On CPAP x ~ 10 yrs.  [name] PAF (paroxysmal atrial fibrillation) ([place])    a. Dx 08/13/2015, without recurrence.  . Pneumonia 2018  . Pre-diabetes   . Prostate cancer ([place])   . Wears glasses     Past Surgical History:  Procedure Laterality Date  . COLONOSCOPY  age 32  . NO PAST SURGERIES    . PROSTATE BIOPSY    . TOTAL HIP ARTHROPLASTY Right 11/20/2018   Procedure: TOTAL HIP ARTHROPLASTY ANTERIOR APPROACH;  Surgeon: [name], MD;  Location: [facility];  Service: Orthopedics;  Laterality: Right;  163min    Home Medications:    Allergies:  Allergies  Allergen Reactions  . Pravastatin Other (See Comments)    Reports causes muscle aches and joint pain  . Rosuvastatin Other (See Comments)    Pt reports causes muscle/joint pain    Family History  Problem Relation Age of Onset  . Hypertension Mother        alive @ 9  . Hypertension Father        died of heart failure @ 16  . Lung cancer Father        smoker  . Multiple sclerosis Sister        deceased  . Other Sister        A & W  . Other Sister        A & W  . Colon cancer Neg Hx   . Heart attack Neg Hx   . Stroke Neg Hx     Social History:  reports that he quit smoking about 30 years ago. His smoking use included cigarettes. He has a 60.00  pack-year smoking history. He quit smokeless tobacco use about 22 years ago.  His smokeless tobacco use included chew. He reports current alcohol use of about 2.0 standard drinks of alcohol per week. He reports previous drug use. Drug: Marijuana.  ROS: A complete review of systems was performed.  All systems are negative except for pertinent findings as noted.  Physical Exam:  Vital signs in last 24 hours: BP 135/69   Pulse 65   Temp (!) 97.1 F (36.2 C) (Oral)   Resp 18   Ht 5\' 8"  (1.727 m)   Wt 103.7 kg   SpO2 99%   BMI 34.76 kg/m  Constitutional:  Alert and oriented, No acute distress Cardiovascular: Regular rate  Respiratory: Normal respiratory effort GI: Abdomen is soft, nontender, nondistended, no abdominal masses. No CVAT.  Genitourinary: Normal male phallus, testes are descended bilaterally and non-tender and without masses, scrotum is normal in appearance without lesions or masses, perineum is normal on inspection.  Lymphatic: No lymphadenopathy Neurologic: Grossly intact, no focal deficits Psychiatric: Normal mood and affect  Laboratory Data:  Recent Labs    08/06/20 1103  WBC 8.4  HGB 16.0  HCT 48.4  PLT 192    Recent Labs    08/06/20 1103  NA 140  K 4.2  CL 104  GLUCOSE 132*  BUN 32*  CALCIUM 9.3  CREATININE 1.31*     No results found for this or any previous visit (from the past 24 hour(s)). Recent Results (from the past 240 hour(s))  SARS CORONAVIRUS 2 (TAT 6-24 HRS) Nasopharyngeal Nasopharyngeal Swab     Status: None   Collection Time: 08/06/20 12:30 PM   Specimen: Nasopharyngeal Swab  Result Value Ref Range Status   SARS Coronavirus 2 NEGATIVE NEGATIVE Final    Comment: (NOTE) SARS-CoV-2 target nucleic acids are NOT DETECTED.  The SARS-CoV-2 RNA is generally detectable in upper and lower respiratory specimens during the acute phase of infection. Negative results do not preclude SARS-CoV-2 infection, do not rule out co-infections with other  pathogens, and should not be used as the sole basis for treatment or other patient management decisions. Negative results must be combined with clinical observations, patient history, and epidemiological information. The expected result is Negative.  Fact Sheet for Patients: SugarRoll.be  Fact Sheet for Healthcare Providers: https://www.woods-mathews.com/  This test is not yet approved or cleared by the [place] FDA and  has been authorized for detection and/or diagnosis of SARS-CoV-2 by FDA under an Emergency Use Authorization (EUA). This EUA will remain  in effect (meaning this test can be used) for the duration of the COVID-19 declaration under Se ction 564(b)(1) of the Act, 21 U.S.C. section 360bbb-3(b)(1), unless the authorization is terminated or revoked sooner.  Performed at [facility], [place] [address] [place], [place] [address]     Renal Function: Recent Labs    08/06/20 1103  CREATININE 1.31*   Estimated Creatinine Clearance: 67.4 mL/min (A) (by C-G formula based on SCr of 1.31 mg/dL (H)).  Radiologic Imaging: No results found.  Impression/Assessment:  PCa  Plan:  I 125 brachytherapy

## 2020-08-09 NOTE — Op Note (Signed)
Preoperative diagnosis: Clinical stage TI C adenocarcinoma the prostate   Postoperative diagnosis: Same   Procedure: I-125 prostate seed implantation, flexible cystoscopy  Surgeon: Lillette Boxer. Dahlstedt M.D.  Radiation Oncologist: Tyler Pita, M.D.  Anesthesia: Gen.   Indications: Patient  was diagnosed with clinical stage TI C prostate cancer. We had extensive discussion with him about treatment options versus. He elected to proceed with seed implantation. He underwent consultation my office as well as with Dr. Tammi Klippel. He appeared to understand the advantages disadvantages potential risks of this treatment option. Full informed consent has been obtained.   Technique and findings: Patient was brought the operating room where he had successful induction of general anesthesia. He was placed in dorso-lithotomy position and prepped and draped in usual manner. Appropriate surgical timeout was performed. Radiation oncology department placed a transrectal ultrasound probe anchoring stand. Foley catheter with contrast in the balloon was inserted without difficulty. Anchoring needles were placed within the prostate. Rectal tube was placed. Real-time contouring of the urethra prostate and rectum were performed and the dosing parameters were established. Targeted dose was 145 gray.  I was then called  to the operating suite suite for placement of the needles. A second timeout was performed. All needle passage was done with real-time transrectal ultrasound guidance with the sagittal plane. A total of 15 needles were placed.  68 active seeds were implanted.   The Foley catheter was removed and flexible cystoscopy failed to show any seeds outside the prostate.  The patient was brought to recovery room in stable condition, having tolerated the procedure well.[name]

## 2020-08-10 ENCOUNTER — Encounter (HOSPITAL_BASED_OUTPATIENT_CLINIC_OR_DEPARTMENT_OTHER): Payer: Self-pay | Admitting: Urology

## 2020-09-15 ENCOUNTER — Telehealth: Payer: Self-pay | Admitting: *Deleted

## 2020-09-15 NOTE — Telephone Encounter (Signed)
CALLED PATIENT TO REMIND OF POST SEED APPTS. FOR 09-16-20, LVM FOR A RETURN CALL

## 2020-09-16 ENCOUNTER — Ambulatory Visit
Admission: RE | Admit: 2020-09-16 | Discharge: 2020-09-16 | Disposition: A | Discharge status: Home / Self Care | Payer: BC Managed Care – PPO | Source: Ambulatory Visit | Attending: Radiation Oncology | Admitting: Radiation Oncology

## 2020-09-16 ENCOUNTER — Other Ambulatory Visit: Payer: Self-pay

## 2020-09-16 ENCOUNTER — Encounter: Payer: Self-pay | Admitting: Urology

## 2020-09-16 ENCOUNTER — Ambulatory Visit
Admission: RE | Admit: 2020-09-16 | Discharge: 2020-09-16 | Disposition: A | Discharge status: Home / Self Care | Payer: BC Managed Care – PPO | Source: Ambulatory Visit | Attending: Urology | Admitting: Urology

## 2020-09-16 VITALS — BP 143/71 | HR 64 | Temp 97.7°F | Resp 20 | Ht 68.0 in | Wt 231.0 lb

## 2020-09-16 DIAGNOSIS — Z7982 Long term (current) use of aspirin: Secondary | ICD-10-CM | POA: Diagnosis not present

## 2020-09-16 DIAGNOSIS — R351 Nocturia: Secondary | ICD-10-CM | POA: Insufficient documentation

## 2020-09-16 DIAGNOSIS — R531 Weakness: Secondary | ICD-10-CM | POA: Diagnosis not present

## 2020-09-16 DIAGNOSIS — R3911 Hesitancy of micturition: Secondary | ICD-10-CM | POA: Diagnosis not present

## 2020-09-16 DIAGNOSIS — Z79899 Other long term (current) drug therapy: Secondary | ICD-10-CM | POA: Insufficient documentation

## 2020-09-16 DIAGNOSIS — Z510 Encounter for antineoplastic radiation therapy: Secondary | ICD-10-CM | POA: Insufficient documentation

## 2020-09-16 DIAGNOSIS — R350 Frequency of micturition: Secondary | ICD-10-CM | POA: Diagnosis not present

## 2020-09-16 DIAGNOSIS — C61 Malignant neoplasm of prostate: Secondary | ICD-10-CM

## 2020-09-16 DIAGNOSIS — Z923 Personal history of irradiation: Secondary | ICD-10-CM | POA: Diagnosis not present

## 2020-09-16 NOTE — Progress Notes (Signed)
  Radiation Oncology         [phone] ________________________________  Name: Carl Humphrey MRN: [id number]  Date: 09/16/2020  DOB: [birth year]  COMPLEX SIMULATION NOTE  NARRATIVE:  The patient was brought to the [facility] today following prostate seed implantation approximately one month ago.  Identity was confirmed.  All relevant records and images related to the planned course of therapy were reviewed.  Then, the patient was set-up supine.  CT images were obtained.  The CT images were loaded into the planning software.  Then the prostate and rectum were contoured.  Treatment planning then occurred.  The implanted iodine 125 seeds were identified by the physics staff for projection of radiation distribution  I have requested : 3D Simulation  I have requested a DVH of the following structures: Prostate and rectum.    ________________________________  Sheral Apley Tammi Klippel, M.D.

## 2020-09-16 NOTE — Progress Notes (Signed)
Radiation Oncology         [phone] ________________________________  Name: Carl Humphrey MRN: [id number]  Date: 09/16/2020  DOB: May 29, 1955  Post-Seed Follow-Up Visit Note  CC: Deland Pretty, MD  Franchot Gallo, MD  Diagnosis:   65 y.o. gentleman with Stage T1c adenocarcinoma of the prostate with Gleason score of 3+4, and PSA of 6.05    ICD-10-CM   1. Malignant neoplasm of prostate ([place])  C61     Interval Since Last Radiation:  5.5 weeks 08/09/20:  Insertion of radioactive I-125 seeds into the prostate gland; 145 Gy, definitive therapy without placement of SpaceOAR gel.  Narrative:  The patient returns today for routine follow-up.  He is complaining of increased urinary frequency and urinary hesitation symptoms. He filled out a questionnaire regarding urinary function today providing and overall IPSS score of 14 characterizing his symptoms as moderate with increased frequency, weak flow and nocturia x4/night. He continues taking Flomax in the evenings as prescribed.  His pre-implant score was 14. He denies any abdominal pain or bowel symptoms. He has noticed a decrease in his stamina but has been able to remain active. Overall, he is pleased with his progress to date.  ALLERGIES:  is allergic to pravastatin and rosuvastatin.  Meds: Current Outpatient Medications  Medication Sig Dispense Refill  . albuterol (VENTOLIN HFA) 108 (90 Base) MCG/ACT inhaler Inhale 2 puffs into the lungs every 6 (six) hours as needed.    . ALPRAZolam (XANAX) 0.5 MG tablet Take 0.25-0.5 mg by mouth daily as needed for anxiety.   0  . ARIPiprazole (ABILIFY) 2 MG tablet Take 1 tablet by mouth daily.    [name] aspirin 81 MG EC tablet 1 tablet    . cholecalciferol (VITAMIN D) 25 MCG (1000 UNIT) tablet 1 capsule    . fenofibrate 160 MG tablet Take 200 mg by mouth daily.    . furosemide (LASIX) 40 MG tablet TAKE 1 TABLET BY MOUTH EVERY DAY 90 tablet 3  . icosapent Ethyl (VASCEPA) 1 g capsule Take 2 capsules (2  g total) by mouth 2 (two) times daily. 360 capsule 3  . levothyroxine (SYNTHROID, LEVOTHROID) 88 MCG tablet Take 100 mcg by mouth daily before breakfast.  0  . metoprolol tartrate (LOPRESSOR) 50 MG tablet TAKE 1 TABLET BY MOUTH TWICE A DAY (Patient taking differently: 2 (two) times daily.) 180 tablet 3  . Multiple Vitamin (MULTI-VITAMIN DAILY PO) 1 tablet    . naproxen sodium (ALEVE) 220 MG tablet Take 440 mg by mouth as needed.    [name] oxymetazoline (AFRIN) 0.05 % nasal spray Place 1 spray into both nostrils 2 (two) times daily. (Patient taking differently: Place 1 spray into both nostrils 2 (two) times daily as needed for congestion.) 30 mL 0  . pantoprazole (PROTONIX) 40 MG tablet Take 40 mg by mouth daily.    [name] PRALUENT 150 MG/ML SOAJ INJECT 150 MG INTO THE SKIN EVERY 14 DAYS. 6 mL 3  . traMADol (ULTRAM) 50 MG tablet Take 1 tablet (50 mg total) by mouth every 6 (six) hours as needed for moderate pain. 6 tablet 0  . valsartan (DIOVAN) 320 MG tablet Take 320 mg by mouth daily.    [name] venlafaxine XR (EFFEXOR-XR) 150 MG 24 hr capsule Take 300 mg by mouth daily.   4  . zolpidem (AMBIEN) 10 MG tablet Take 10 mg by mouth at bedtime.  0   No current facility-administered medications for this encounter.    Physical Findings: In general this  is a well appearing Caucasian male in no acute distress. He's alert and oriented x4 and appropriate throughout the examination. Cardiopulmonary assessment is negative for acute distress and he exhibits normal effort.   Lab Findings: Lab Results  Component Value Date   WBC 8.4 08/06/2020   HGB 16.0 08/06/2020   HCT 48.4 08/06/2020   MCV 88.8 08/06/2020   PLT 192 08/06/2020    Radiographic Findings:  Patient underwent CT imaging in our clinic for post implant dosimetry. The CT will be reviewed by Dr. Tammi Klippel to confirm there is an adequate distribution of radioactive seeds throughout the prostate gland and ensure that there are no seeds in or near the rectum. We  suspect the final radiation plan and dosimetry will show appropriate coverage of the prostate gland. He understands that we will call and inform him of any unexpected findings on further review of his imaging and dosimetry.  Impression/Plan: 65 y.o. gentleman with Stage T1c adenocarcinoma of the prostate with Gleason score of 3+4, and PSA of 6.05 The patient is recovering from the effects of radiation. His urinary symptoms should gradually improve over the next 4-6 months. We talked about this today. He is encouraged by his improvement already and is otherwise pleased with his outcome. We also talked about long-term follow-up for prostate cancer following seed implant. He understands that ongoing PSA determinations and digital rectal exams will help perform surveillance to rule out disease recurrence. He saw Jiles Crocker, NP on 09/02/20 and has a follow up appointment scheduled with Dahlstedt on 12/15/20 with labs prior. He understands what to expect with his PSA measures. Patient was also educated today about some of the long-term effects from radiation including a small risk for rectal bleeding and possibly erectile dysfunction. We talked about some of the general management approaches to these potential complications. However, I did encourage the patient to contact our office or return at any point if he has questions or concerns related to his previous radiation and prostate cancer.    Nicholos Johns, PA-C

## 2020-09-16 NOTE — Progress Notes (Signed)
Patient is here today for a post -seed follow-up. Patient reports  Dysuria none Hematuria none Nocturia 4-5  Leakage no Urgency yes Emptying bladder Most of the time Stream moderate Push /strain to start stream no Stop /go during urination no Constipation/Diarrhea no  Next appointment with urologist  June 27  Today's Vitals   09/16/20 0923  BP: (!) 143/71  Pulse: 64  Resp: 20  Temp: 97.7 F (36.5 C)  SpO2: 98%  Weight: 104.8 kg  Height: 5\' 8"  (1.727 m)  PainSc: 0-No pain   Body mass index is 35.12 kg/m.  [place]

## 2020-10-01 ENCOUNTER — Ambulatory Visit
Admission: RE | Admit: 2020-10-01 | Discharge: 2020-10-01 | Disposition: A | Discharge status: Home / Self Care | Payer: BC Managed Care – PPO | Source: Ambulatory Visit | Attending: Radiation Oncology | Admitting: Radiation Oncology

## 2020-10-01 ENCOUNTER — Encounter: Payer: Self-pay | Admitting: Radiation Oncology

## 2020-10-01 DIAGNOSIS — C61 Malignant neoplasm of prostate: Secondary | ICD-10-CM | POA: Diagnosis present

## 2020-10-02 NOTE — Progress Notes (Signed)
  Radiation Oncology         [phone] ________________________________  Name: Carl Humphrey MRN: [id number]  Date: 10/01/2020  DOB: 1955-11-27  3D Planning Note   Prostate Brachytherapy Post-Implant Dosimetry  Diagnosis: 65 y.o. gentleman with Stage T1c adenocarcinoma of the prostate with Gleason score of 3+4, and PSA of 6.05.  Narrative: On a previous date, [name] returned following prostate seed implantation for post implant planning. He underwent CT scan complex simulation to delineate the three-dimensional structures of the pelvis and demonstrate the radiation distribution.  Since that time, the seed localization, and complex isodose planning with dose volume histograms have now been completed.  Results:   Prostate Coverage - The dose of radiation delivered to the 90% or more of the prostate gland (D90) was 102.75% of the prescription dose. This exceeds our goal of greater than 90%. Rectal Sparing - The volume of rectal tissue receiving the prescription dose or higher was 0.0 cc. This falls under our thresholds tolerance of 1.0 cc.  Impression: The prostate seed implant appears to show adequate target coverage and appropriate rectal sparing.  Plan:  The patient will continue to follow with urology for ongoing PSA determinations. I would anticipate a high likelihood for local tumor control with minimal risk for rectal morbidity.  ________________________________  Sheral Apley Tammi Klippel, M.D.

## 2021-02-15 ENCOUNTER — Other Ambulatory Visit: Payer: Self-pay

## 2021-02-15 MED ORDER — ICOSAPENT ETHYL 1 G PO CAPS: 2.0000 g | ORAL_CAPSULE | Freq: Two times a day (BID) | ORAL | 0 refills | Status: DC

## 2021-03-23 ENCOUNTER — Other Ambulatory Visit: Payer: Self-pay | Admitting: Nurse Practitioner

## 2021-03-23 MED ORDER — METOPROLOL TARTRATE 50 MG PO TABS: 50.0000 mg | ORAL_TABLET | Freq: Two times a day (BID) | ORAL | 0 refills | Status: DC

## 2021-04-19 NOTE — Progress Notes (Signed)
Cardiology Office Note    Date:  04/26/2021   ID:  Carl Humphrey, DOB June 06, 1955, MRN [id number]   PCP:  Deland Pretty, [place]  Cardiologist:  Ena Dawley, MD (Inactive)   Advanced Practice Provider:  No care team member to display Electrophysiologist:  None   [phone]   No chief complaint on file.   History of Present Illness:  Carl Humphrey is a 65 y.o. male with history of PAF in 2017 without recurrence, nonobstructive CAD on coronary CTA 10/2017 70 percentile calcium score, 50 to 69% LAD with negative FFR, HTN, hypertriglyceridemia, chronic diastolic CHF, obesity, OSA on CPAP, heavy EtOH use.  Last echo 2019 LVEF 50% with grade 2 DD.   Patient was never anticoagulated for his atrial fib 2017 because his chads vas score was one at the time but when I saw him last year his CHA2DS2-VASc was 3.  Zio patch was  recommended but he never wore it   He was also having trouble with acute on chronic diastolic CHF that improved with doubling up on Lasix for 2 weeks.  I increase his Lasix to 40 mg daily.  I also asked him to cut back on alcohol intake.  I saw the patient 04/22/20 and chronic DOE, excessive ETOH.  Since here last he had a hip replacement. Walks some. 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 Denies chest pain, palpitations, edema. He thinks he has an umbilical hernia. DOE has improved with lasix. Drinking same amount of alcohol-30 beers weekly and occasional shot. No symptoms of Afib or irreg heart.      Past Medical History:  Diagnosis Date   Anxiety    Arthritis    back lower pain at times   CAD (coronary artery disease)    a. nonobstructive by CT 2019.   Chronic diastolic CHF (congestive heart failure) ([place])    COVID jan 2021, dec  2021   rapid home test dec 2021 test due to exposure no symptoms   Depression    Dysrhythmia    AFIB   ETOH abuse    Former tobacco use    GERD (gastroesophageal  reflux disease)    Headache    Hypertensive heart disease    Hypertriglyceridemia    Hypothyroidism    Obesity    Obstructive sleep apnea    a. On CPAP x ~ 10 yrs.   PAF (paroxysmal atrial fibrillation) ([place])    a. Dx 08/13/2015, without recurrence.   Pneumonia 2018   Pre-diabetes    Prostate cancer ([place])    Wears glasses     Past Surgical History:  Procedure Laterality Date   COLONOSCOPY  age 58   NO PAST SURGERIES     PROSTATE BIOPSY     RADIOACTIVE SEED IMPLANT N/A 08/09/2020   Procedure: RADIOACTIVE SEED IMPLANT/BRACHYTHERAPY IMPLANT;  Surgeon: Franchot Gallo, MD;  Location: [facility];  Service: Urology;  Laterality: N/A;  [place] Right 11/20/2018   Procedure: TOTAL HIP ARTHROPLASTY ANTERIOR APPROACH;  Surgeon: [name], MD;  Location: [facility];  Service: Orthopedics;  Laterality: Right;  154mn    Current Medications: Current Meds  Medication Sig   ALPRAZolam (XANAX) 0.5 MG tablet Take 0.25-0.5 mg by mouth daily as needed for anxiety.    ARIPiprazole (ABILIFY) 2 MG tablet Take 1 tablet by mouth daily.   aspirin 81 MG EC tablet 1 tablet  buPROPion (WELLBUTRIN XL) 150 MG 24 hr tablet Take 1 tablet by mouth every morning.   chlorhexidine (PERIDEX) 0.12 % solution    cholecalciferol (VITAMIN D) 25 MCG (1000 UNIT) tablet 1 capsule   fenofibrate 160 MG tablet Take 200 mg by mouth daily.   fenofibrate micronized (LOFIBRA) 200 MG capsule Take 200 mg by mouth daily.   furosemide (LASIX) 40 MG tablet TAKE 1 TABLET BY MOUTH EVERY DAY   icosapent Ethyl (VASCEPA) 1 g capsule Take 2 capsules (2 g total) by mouth 2 (two) times daily. Please keep upcoming appt.with Ermalinda Barrios in Dec. In order to receive future refills. Thank you   levothyroxine (SYNTHROID) 100 MCG tablet Take 1 tablet by mouth daily.   metoprolol tartrate (LOPRESSOR) 50 MG tablet Take 1 tablet (50 mg total) by mouth 2 (two) times daily.   Multiple Vitamin (MULTI-VITAMIN  DAILY PO) 1 tablet   naproxen sodium (ALEVE) 220 MG tablet Take 440 mg by mouth as needed.   oxymetazoline (AFRIN) 0.05 % nasal spray Place 1 spray into both nostrils 2 (two) times daily. (Patient taking differently: Place 1 spray into both nostrils 2 (two) times daily as needed for congestion.)   pantoprazole (PROTONIX) 40 MG tablet Take 40 mg by mouth daily.   PRALUENT 150 MG/ML SOAJ INJECT 150 MG INTO THE SKIN EVERY 14 DAYS.   tamsulosin (FLOMAX) 0.4 MG CAPS capsule SMARTSIG:1 Capsule(s) By Mouth Every Evening   traMADol (ULTRAM) 50 MG tablet Take 1 tablet (50 mg total) by mouth every 6 (six) hours as needed for moderate pain.   valsartan (DIOVAN) 320 MG tablet Take 320 mg by mouth daily.   venlafaxine XR (EFFEXOR-XR) 150 MG 24 hr capsule Take 300 mg by mouth daily.    XIIDRA 5 % SOLN    zolpidem (AMBIEN) 10 MG tablet Take 10 mg by mouth at bedtime.     Allergies:   Pravastatin and Rosuvastatin   Social History   Socioeconomic History   Marital status: Married    Spouse name: Not on file   Number of children: 2   Years of education: Not on file   Highest education level: Not on file  Occupational History   Occupation: self employed  Tobacco Use   Smoking status: Former    Packs/day: 4.00    Years: 15.00    Pack years: 60.00    Types: Cigarettes    Quit date: 09/18/1989    Years since quitting: 31.6   Smokeless tobacco: Former    Types: Chew    Quit date: 09/18/1997  Vaping Use   Vaping Use: Never used  Substance and Sexual Activity   Alcohol use: Yes    Alcohol/week: 2.0 standard drinks    Types: 1 Cans of beer, 1 Shots of liquor per week    Comment: He drinks  6 beers/day.   Drug use: Not Currently    Types: Marijuana    Comment:  marijuana last used 1 week ago 3 as of 08-03-2020   Sexual activity: Yes  Other Topics Concern   Not on file  Social History Narrative   Lives in [place] with his wife.  He walks his dog about 2.5 miles/day w/o limitations.  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   Social Determinants of Health   Financial Resource Strain: Not on file  Food Insecurity: Not on file  Transportation Needs: Not on file  Physical Activity: Not on file  Stress: Not on file  Social Connections: Not on file     Family History:  The patient's  family history includes Hypertension in his father and mother; Lung cancer in his father; Multiple sclerosis in his sister; Other in his sister and sister.   ROS:   Please see the history of present illness.    ROS All other systems reviewed and are negative.   PHYSICAL EXAM:   VS:  BP 134/70   Pulse (!) 58   Ht _0  (1.727 m)   Wt 227 lb (103 kg)   SpO2 98%   BMI 34.52 kg/m   Physical Exam  GEN: Obese, in no acute distress  Neck: no JVD, carotid bruits, or masses Cardiac:RRR; no murmurs, rubs, or gallops  Respiratory:  clear to auscultation bilaterally, normal work of breathing GI: umbilical hernia,soft, nontender, nondistended, + BS Ext: without cyanosis, clubbing, or edema, Good distal pulses bilaterally Neuro:  Alert and Oriented x 3 Psych: euthymic mood, full affect  Wt Readings from Last 3 Encounters:  04/26/21 227 lb (103 kg)  09/16/20 231 lb (104.8 kg)  08/09/20 228 lb 9.6 oz (103.7 kg)      Studies/Labs Reviewed:   EKG:  EKG is not ordered today.     Recent Labs: 08/06/2020: ALT 25; BUN 32; Creatinine, Ser 1.31; Hemoglobin 16.0; Platelets 192; Potassium 4.2; Sodium 140   Lipid Panel    Component Value Date/Time   CHOL 127 04/21/2020 1304   TRIG 578 (HH) 04/21/2020 1304   HDL 45 04/21/2020 1304   CHOLHDL 2.8 04/21/2020 1304   CHOLHDL 2.7 09/22/2017 0521   VLDL 16 09/22/2017 0521   LDLCALC 9 04/21/2020 1304    Additional studies/ records that were reviewed today include:  2D echo 2019   Study Conclusions   - Left ventricle: The cavity size was mildly dilated. Wall   thickness was increased in a pattern of mild LVH. Systolic    function was at the lower limits of normal. The estimated   ejection fraction was 50%. Wall motion was normal; there were no   regional wall motion abnormalities. Features are consistent with   a pseudonormal left ventricular filling pattern, with concomitant   abnormal relaxation and increased filling pressure (grade 2   diastolic dysfunction). Doppler parameters are consistent with   high ventricular filling pressure. - Mitral valve: There was mild regurgitation. - Left atrium: The atrium was mildly dilated. - Right atrium: The atrium was mildly dilated. - Atrial septum: No defect or patent foramen ovale was identified.     Risk Assessment/Calculations:    CHA2DS2-VASc Score = 3   This indicates a 3.2% annual risk of stroke. The patient's score is based upon: CHF History: 1 HTN History: 1 Diabetes History: 0 Stroke History: 0 Vascular Disease History: 1 Age Score: 0 Gender Score: 0       ASSESSMENT:    1. Paroxysmal atrial fibrillation (HCC)   2. Coronary artery disease involving native coronary artery of native heart without angina pectoris   3. Chronic diastolic CHF (congestive heart failure) ([place])   4. Hyperlipidemia, unspecified hyperlipidemia type   5. Morbid obesity ([place])   6. Obstructive sleep apnea   7. ETOH abuse      PLAN:  In order of problems listed above:  PAF in 2017 without recurrence.  CHA2DS2-VASc one at that time but is now 3.  Has never worn monitor provided to rule out recurrence.  Has had no symptoms and will continue to monitor.   CAD  50 to 69% LAD on coronary CTA 10/2017 FFR was negative no angina.   Essential hypertension blood pressure well controlled   Chronic diastolic CHF no evidence of heart failure on exam. Edema controlled with Lasix. Labs in Aug normal   Hyperlipidemia on Praluent and fenofibrate Trig>600 in Aug, LDL not calculated  Obesity increase exercise and weight loss.   OSA compliant with CPAP   EtOH discussed  decreasing alcohol intake with patient   Prostate cancer  s/p seed implants   Shared Decision Making/Informed Consent        Medication Adjustments/Labs and Tests Ordered: Current medicines are reviewed at length with the patient today.  Concerns regarding medicines are outlined above.  Medication changes, Labs and Tests ordered today are listed in the Patient Instructions below. Patient Instructions  Medication Instructions:    Your physician recommends that you continue on your current medications as directed. Please refer to the Current Medication list given to you today.  *If you need a refill on your cardiac medications before your next appointment, please call your pharmacy*   Lab Work: [place]   If you have labs (blood work) drawn today and your tests are completely normal, you will receive your results only by: [place] (if you have MyChart) OR A paper copy in the mail If you have any lab test that is abnormal or we need to change your treatment, we will call you to review the results.   Testing/Procedures: NONE ORDERED  TODAY    Follow-Up: At [facility], you and your health needs are our priority.  As part of our continuing mission to provide you with exceptional heart care, we have created designated Provider Care Teams.  These Care Teams include your primary Cardiologist (physician) and Advanced Practice Providers (APPs -  Physician Assistants and Nurse Practitioners) who all work together to provide you with the care you need, when you need it.  We recommend signing up for the patient portal called "MyChart".  Sign up information is provided on this After Visit Summary.  MyChart is used to connect with patients for Virtual Visits (Telemedicine).  Patients are able to view lab/test results, encounter notes, upcoming appointments, etc.  Non-urgent messages can be sent to your provider as well.   To learn more about what you can do with MyChart, go  to [email or web address].    Your next appointment:   1 year(s)  The format for your next appointment:   In Person  Provider:   Dr Gwyndolyn Kaufman   Cutback on Alcohol intake   1}  Exercise Information for Aging Adults Staying physically active is important as you age. Physical activity and exercise can help in maintaining quality of life, health, physical function, and reducing falls. The four types of exercises that are best for older adults are endurance, strength, balance, and flexibility. Contact your health care provider before you start any exercise routine. Ask your health care provider what activities are safe for you. What are the risks? Risks associated with exercising include: Overdoing it. This may lead to sore muscles or fatigue. Falls. Injuries. Dehydration. How to do these exercises Endurance exercises Endurance (aerobic) exercises raise your breathing rate and heart rate. Increasing your endurance helps you do everyday tasks and stay healthy. By improving the health of your body system that includes your heart, lungs, and blood vessels (circulatory system), you may also delay or prevent diseases such as heart disease, diabetes, and weak bones (osteoporosis). Types of endurance  exercises include: Sports. Indoor activities, such as using gym equipment, doing water aerobics, or dancing. Outdoor activities, such as biking or jogging. Tasks around the house, such as gardening, yard work, and heavy household chores like cleaning. Walking, such as hiking or walking around your neighborhood. When doing endurance exercises, make sure you: Are aware of your surroundings. Use safety equipment as directed. Dress in layers when exercising outdoors. Drink plenty of water to stay well hydrated. Build up endurance slowly. Start with 10 minutes at a time, and gradually build up to doing 30 minutes at a time. Unless your health care provider gave you different instructions, aim  to exercise for a total of 150 minutes a week. Spread out that time so you are working on endurance 3 or more days a week. Strength exercises Lifting, pulling, or pushing weights helps to strengthen muscles. Having stronger muscles makes it easier to do everyday activities, such as getting up from a chair, climbing stairs, carrying groceries, and playing with grandchildren. Strength exercises include arm and leg exercises that may be done: With weights. Without weights (using your own body weight). With a resistance band. When doing strength exercises: Move smoothly and steadily. Do not suddenly thrust or jerk the weights, the resistance band, or your body. Start with no weights or with light weights, and gradually add more weight over time. Eventually, aim to use weights that are hard or very hard for you to lift. This means that you are able to do 8 repetitions with the weight, and the last few repetitions are very challenging. Lift or push weights into position for 3 seconds, hold the position for 1 second, and then take 3 seconds to return to your starting position. Breathe out (exhale) during difficult movements, like lifting or pushing weights. Breathe in (inhale) to relax your muscles before the next repetition. Consider alternating arms or legs, especially when you first start strength exercises. Expect some slight muscle soreness after each session. Do strength exercises on 2 or more days a week, for 30 minutes at a time. Avoid exercising the same muscle groups two days in a row. For example, if you work on your leg muscles one day, work on your arm muscles the next day. When you can do two sets of 10-15 repetitions with a certain weight, increase the amount of weight. Balance exercises Balance exercises can help to prevent falls. Balance exercises include: Standing on one foot. Heel-to-toe walk. Balance walk. Tai chi. Make sure you have something sturdy to hold onto while doing balance  exercises, such as a sturdy chair. As your balance improves, challenge yourself by holding on to the chair with one hand instead of two, and then with no hands. Trying exercises with your eyes closed also challenges your balance, but be sure to have a sturdy surface (like a countertop) close by in case you need it. Do balance exercises as often as you want, or as often as directed by your health care provider. Flexibility exercises Flexibility exercises improve how far you can bend, straighten, move, or rotate parts of your body (range of motion). These exercises also help you do everyday activities such as getting dressed or reaching for objects. Flexibility exercises include stretching different parts of the body, and they may be done in a standing or seated position or on the floor. When stretching, make sure you: Keep a slight bend in your arms and legs. Avoid completely straightening ("locking") your joints. Do not stretch so far that you feel  pain. You should feel a mild stretching feeling. You may try stretching farther as you become more flexible over time. Relax and breathe between stretches. Hold on to something sturdy for balance as needed. Hold each stretch for 10-30 seconds. Repeat each stretch 3-5 times. General safety tips Exercise in well-lit areas. Do not hold your breath during exercises or stretches. Warm up before exercising, and cool down after exercising. This can help prevent injury. Drink plenty of water during exercise or any activity that makes you sweat. If you are not sure if an exercise is safe for you, or you are not sure how to do an exercise, talk with your health care provider. This is especially important if you have had surgery on muscles, bones, or joints (orthopedic surgery). Where to find more information You can find more information about exercise for older adults from: Your local health department, fitness center, or community center. These facilities may  have programs for aging adults. [employer] on Aging: http://kim-miller.com/ National Council on Aging: www.ncoa.org Summary Staying physically active is important as you age. Doing endurance, strength, balance, and flexibility exercises can help in maintaining quality of life, health, physical function, and reducing falls. Make sure to contact your health care provider before you start any exercise routine. Ask your health care provider what activities are safe for you. This information is not intended to replace advice given to you by your health care provider. Make sure you discuss any questions you have with your health care provider. Document Revised: 09/20/2020 Document Reviewed: 09/20/2020 Elsevier Patient Education  2022 [place], Ermalinda Barrios, [place]  04/26/2021 1:44 PM    [facility] [place], [place], [place]  [address] Phone: [phone]; Fax: [phone]

## 2021-04-26 ENCOUNTER — Ambulatory Visit: Payer: BC Managed Care – PPO | Admitting: Physician Assistant

## 2021-04-26 ENCOUNTER — Other Ambulatory Visit: Payer: Self-pay

## 2021-04-26 ENCOUNTER — Encounter: Payer: Self-pay | Admitting: Physician Assistant

## 2021-04-26 VITALS — BP 134/70 | HR 58 | Ht 68.0 in | Wt 227.0 lb

## 2021-04-26 DIAGNOSIS — F1010 Alcohol abuse, uncomplicated: Secondary | ICD-10-CM

## 2021-04-26 DIAGNOSIS — E785 Hyperlipidemia, unspecified: Secondary | ICD-10-CM | POA: Diagnosis not present

## 2021-04-26 DIAGNOSIS — I480 Paroxysmal atrial fibrillation: Secondary | ICD-10-CM

## 2021-04-26 DIAGNOSIS — G4733 Obstructive sleep apnea (adult) (pediatric): Secondary | ICD-10-CM

## 2021-04-26 DIAGNOSIS — I5032 Chronic diastolic (congestive) heart failure: Secondary | ICD-10-CM

## 2021-04-26 DIAGNOSIS — I2510 Atherosclerotic heart disease of native coronary artery without angina pectoris: Secondary | ICD-10-CM

## 2021-04-26 NOTE — Patient Instructions (Addendum)
Medication Instructions:    Your physician recommends that you continue on your current medications as directed. Please refer to the Current Medication list given to you today.  *If you need a refill on your cardiac medications before your next appointment, please call your pharmacy*   Lab Work: [place]   If you have labs (blood work) drawn today and your tests are completely normal, you will receive your results only by: [place] (if you have MyChart) OR A paper copy in the mail If you have any lab test that is abnormal or we need to change your treatment, we will call you to review the results.   Testing/Procedures: NONE ORDERED  TODAY    Follow-Up: At [facility], you and your health needs are our priority.  As part of our continuing mission to provide you with exceptional heart care, we have created designated Provider Care Teams.  These Care Teams include your primary Cardiologist (physician) and Advanced Practice Providers (APPs -  Physician Assistants and Nurse Practitioners) who all work together to provide you with the care you need, when you need it.  We recommend signing up for the patient portal called "MyChart".  Sign up information is provided on this After Visit Summary.  MyChart is used to connect with patients for Virtual Visits (Telemedicine).  Patients are able to view lab/test results, encounter notes, upcoming appointments, etc.  Non-urgent messages can be sent to your provider as well.   To learn more about what you can do with MyChart, go to [email or web address].    Your next appointment:   1 year(s)  The format for your next appointment:   In Person  Provider:   Dr Gwyndolyn Kaufman   Cutback on Alcohol intake   1}  Exercise Information for Aging Adults Staying physically active is important as you age. Physical activity and exercise can help in maintaining quality of life, health, physical function, and reducing falls. The four  types of exercises that are best for older adults are endurance, strength, balance, and flexibility. Contact your health care provider before you start any exercise routine. Ask your health care provider what activities are safe for you. What are the risks? Risks associated with exercising include: Overdoing it. This may lead to sore muscles or fatigue. Falls. Injuries. Dehydration. How to do these exercises Endurance exercises Endurance (aerobic) exercises raise your breathing rate and heart rate. Increasing your endurance helps you do everyday tasks and stay healthy. By improving the health of your body system that includes your heart, lungs, and blood vessels (circulatory system), you may also delay or prevent diseases such as heart disease, diabetes, and weak bones (osteoporosis). Types of endurance exercises include: Sports. Indoor activities, such as using gym equipment, doing water aerobics, or dancing. Outdoor activities, such as biking or jogging. Tasks around the house, such as gardening, yard work, and heavy household chores like cleaning. Walking, such as hiking or walking around your neighborhood. When doing endurance exercises, make sure you: Are aware of your surroundings. Use safety equipment as directed. Dress in layers when exercising outdoors. Drink plenty of water to stay well hydrated. Build up endurance slowly. Start with 10 minutes at a time, and gradually build up to doing 30 minutes at a time. Unless your health care provider gave you different instructions, aim to exercise for a total of 150 minutes a week. Spread out that time so you are working on endurance 3 or more days a week. Strength exercises Lifting,  pulling, or pushing weights helps to strengthen muscles. Having stronger muscles makes it easier to do everyday activities, such as getting up from a chair, climbing stairs, carrying groceries, and playing with grandchildren. Strength exercises include arm and leg  exercises that may be done: With weights. Without weights (using your own body weight). With a resistance band. When doing strength exercises: Move smoothly and steadily. Do not suddenly thrust or jerk the weights, the resistance band, or your body. Start with no weights or with light weights, and gradually add more weight over time. Eventually, aim to use weights that are hard or very hard for you to lift. This means that you are able to do 8 repetitions with the weight, and the last few repetitions are very challenging. Lift or push weights into position for 3 seconds, hold the position for 1 second, and then take 3 seconds to return to your starting position. Breathe out (exhale) during difficult movements, like lifting or pushing weights. Breathe in (inhale) to relax your muscles before the next repetition. Consider alternating arms or legs, especially when you first start strength exercises. Expect some slight muscle soreness after each session. Do strength exercises on 2 or more days a week, for 30 minutes at a time. Avoid exercising the same muscle groups two days in a row. For example, if you work on your leg muscles one day, work on your arm muscles the next day. When you can do two sets of 10-15 repetitions with a certain weight, increase the amount of weight. Balance exercises Balance exercises can help to prevent falls. Balance exercises include: Standing on one foot. Heel-to-toe walk. Balance walk. Tai chi. Make sure you have something sturdy to hold onto while doing balance exercises, such as a sturdy chair. As your balance improves, challenge yourself by holding on to the chair with one hand instead of two, and then with no hands. Trying exercises with your eyes closed also challenges your balance, but be sure to have a sturdy surface (like a countertop) close by in case you need it. Do balance exercises as often as you want, or as often as directed by your health care  provider. Flexibility exercises Flexibility exercises improve how far you can bend, straighten, move, or rotate parts of your body (range of motion). These exercises also help you do everyday activities such as getting dressed or reaching for objects. Flexibility exercises include stretching different parts of the body, and they may be done in a standing or seated position or on the floor. When stretching, make sure you: Keep a slight bend in your arms and legs. Avoid completely straightening ("locking") your joints. Do not stretch so far that you feel pain. You should feel a mild stretching feeling. You may try stretching farther as you become more flexible over time. Relax and breathe between stretches. Hold on to something sturdy for balance as needed. Hold each stretch for 10-30 seconds. Repeat each stretch 3-5 times. General safety tips Exercise in well-lit areas. Do not hold your breath during exercises or stretches. Warm up before exercising, and cool down after exercising. This can help prevent injury. Drink plenty of water during exercise or any activity that makes you sweat. If you are not sure if an exercise is safe for you, or you are not sure how to do an exercise, talk with your health care provider. This is especially important if you have had surgery on muscles, bones, or joints (orthopedic surgery). Where to find more information You  can find more information about exercise for older adults from: Your local health department, fitness center, or community center. These facilities may have programs for aging adults. [employer] on Aging: http://kim-miller.com/ National Council on Aging: www.ncoa.org Summary Staying physically active is important as you age. Doing endurance, strength, balance, and flexibility exercises can help in maintaining quality of life, health, physical function, and reducing falls. Make sure to contact your health care provider before you start any  exercise routine. Ask your health care provider what activities are safe for you. This information is not intended to replace advice given to you by your health care provider. Make sure you discuss any questions you have with your health care provider. Document Revised: 09/20/2020 Document Reviewed: 09/20/2020 Elsevier Patient Education  [place].

## 2021-05-17 ENCOUNTER — Other Ambulatory Visit: Payer: Self-pay | Admitting: Physician Assistant

## 2021-05-18 ENCOUNTER — Other Ambulatory Visit: Payer: Self-pay

## 2021-05-18 DIAGNOSIS — E7849 Other hyperlipidemia: Secondary | ICD-10-CM

## 2021-05-18 MED ORDER — PRALUENT 150 MG/ML ~~LOC~~ SOAJ: SUBCUTANEOUS | 3 refills | Status: DC

## 2021-05-18 MED ORDER — FUROSEMIDE 40 MG PO TABS: 40.0000 mg | ORAL_TABLET | Freq: Every day | ORAL | 3 refills | Status: DC

## 2021-06-15 NOTE — Progress Notes (Signed)
This encounter was created in error - please disregard.

## 2021-10-18 ENCOUNTER — Other Ambulatory Visit: Payer: Self-pay | Admitting: *Deleted

## 2021-10-18 MED ORDER — METOPROLOL TARTRATE 50 MG PO TABS: 50.0000 mg | ORAL_TABLET | Freq: Two times a day (BID) | ORAL | 2 refills | Status: DC

## 2022-02-25 ENCOUNTER — Other Ambulatory Visit: Payer: Self-pay

## 2022-02-25 ENCOUNTER — Emergency Department (HOSPITAL_COMMUNITY)
Admission: EM | Admit: 2022-02-25 | Discharge: 2022-02-25 | Disposition: A | Discharge status: Home / Self Care | Payer: Medicare PPO | Attending: Emergency Medicine | Admitting: Emergency Medicine

## 2022-02-25 ENCOUNTER — Encounter (HOSPITAL_COMMUNITY): Payer: Self-pay | Admitting: Emergency Medicine

## 2022-02-25 DIAGNOSIS — W268XXA Contact with other sharp object(s), not elsewhere classified, initial encounter: Secondary | ICD-10-CM | POA: Diagnosis not present

## 2022-02-25 DIAGNOSIS — Z7982 Long term (current) use of aspirin: Secondary | ICD-10-CM | POA: Diagnosis not present

## 2022-02-25 DIAGNOSIS — Z79899 Other long term (current) drug therapy: Secondary | ICD-10-CM | POA: Insufficient documentation

## 2022-02-25 DIAGNOSIS — I4891 Unspecified atrial fibrillation: Secondary | ICD-10-CM | POA: Insufficient documentation

## 2022-02-25 DIAGNOSIS — S6991XA Unspecified injury of right wrist, hand and finger(s), initial encounter: Secondary | ICD-10-CM | POA: Diagnosis present

## 2022-02-25 DIAGNOSIS — Z23 Encounter for immunization: Secondary | ICD-10-CM | POA: Insufficient documentation

## 2022-02-25 DIAGNOSIS — I10 Essential (primary) hypertension: Secondary | ICD-10-CM | POA: Insufficient documentation

## 2022-02-25 DIAGNOSIS — E039 Hypothyroidism, unspecified: Secondary | ICD-10-CM | POA: Insufficient documentation

## 2022-02-25 DIAGNOSIS — S61411A Laceration without foreign body of right hand, initial encounter: Secondary | ICD-10-CM | POA: Diagnosis not present

## 2022-02-25 MED ORDER — TETANUS-DIPHTH-ACELL PERTUSSIS 5-2.5-18.5 LF-MCG/0.5 IM SUSY
0.5000 mL | PREFILLED_SYRINGE | Freq: Once | INTRAMUSCULAR | Status: AC
  Administered 2022-02-25: 0.5 mL via INTRAMUSCULAR
  Filled 2022-02-25: qty 0.5

## 2022-02-25 NOTE — ED Triage Notes (Signed)
Patient here w/ laceration the the R hand. Accidentally cut it on a pet cage. Unknown last tetanus vaccine.

## 2022-02-25 NOTE — Discharge Instructions (Signed)
Cover the hand anytime it will come into contact with dirt or could be contaminated.  It is okay to take a shower and it can get wet just do not soak it.  You can take the bandage off tomorrow that was placed today.  Just placed some Vaseline over the wound and it should heal without problems.  You had 13 dissolvable stitches.  You should be able to pull those out within 7 days.

## 2022-02-25 NOTE — ED Provider Notes (Signed)
[facility] EMERGENCY DEPARTMENT Provider Note   CSN: [id number] Arrival date & time: 02/25/22  1318     History  Chief Complaint  Patient presents with   Laceration    Carl Humphrey is a 66 y.o. male.  Patient is a 66 year old male with a history of GERD, hypertension, hypothyroidism, paroxysmal atrial fibrillation without anticoagulation, CAD, CHF and prediabetes who is presenting today after a fall and injury to his right hand.  Patient reports he was getting out of bed this morning when he stepped on a pee pad that his dog had used and slipped causing him to fall hitting his hand on a safety gait and then falling to the ground hitting his head on the floor.  This occurred approximately 9 hours ago.  He initially went to an urgent care who recommended he come here for further evaluation.  He is not having any weakness or numbness of his hand.  He denies any pain to his wrist elbow or shoulder.  He has not had any headaches, visual changes or neck pain.  He denies any nausea, vomiting, balance issues.  The history is provided by the patient.  Laceration      Home Medications Prior to Admission medications   Medication Sig Start Date End Date Taking? Authorizing Provider  albuterol (VENTOLIN HFA) 108 (90 Base) MCG/ACT inhaler Inhale 2 puffs into the lungs every 6 (six) hours as needed. Patient not taking: Reported on 09/16/2020    [provider]  Alirocumab (PRALUENT) 150 MG/ML SOAJ INJECT 150 MG INTO THE SKIN EVERY 14 DAYS. 05/18/21   Imogene Burn, PA-C  ALPRAZolam Duanne Moron) 0.5 MG tablet Take 0.25-0.5 mg by mouth daily as needed for anxiety.  09/05/17   [provider]  ARIPiprazole (ABILIFY) 2 MG tablet Take 1 tablet by mouth daily.    [provider]  aspirin 81 MG EC tablet 1 tablet    [provider]  buPROPion (WELLBUTRIN XL) 150 MG 24 hr tablet Take 1 tablet by mouth every morning. 08/05/20   [provider]   chlorhexidine (PERIDEX) 0.12 % solution  08/16/20   [provider]  cholecalciferol (VITAMIN D) 25 MCG (1000 UNIT) tablet 1 capsule    [provider]  fenofibrate 160 MG tablet Take 200 mg by mouth daily. 09/28/12   [provider]  fenofibrate micronized (LOFIBRA) 200 MG capsule Take 200 mg by mouth daily. 07/14/20   [provider]  furosemide (LASIX) 40 MG tablet Take 1 tablet (40 mg total) by mouth daily. 05/18/21   Freada Bergeron, MD  icosapent Ethyl (VASCEPA) 1 g capsule Take 2 capsules (2 g total) by mouth 2 (two) times daily. 05/18/21   Freada Bergeron, MD  levothyroxine (SYNTHROID) 100 MCG tablet Take 100 mcg by mouth daily. Patient not taking: Reported on 04/26/2021 07/13/20   [provider]  levothyroxine (SYNTHROID) 100 MCG tablet Take 1 tablet by mouth daily.    [provider]  levothyroxine (SYNTHROID, LEVOTHROID) 88 MCG tablet Take 100 mcg by mouth daily before breakfast. 08/08/17   [provider]  metoprolol tartrate (LOPRESSOR) 50 MG tablet Take 1 tablet (50 mg total) by mouth 2 (two) times daily. 10/18/21   Freada Bergeron, MD  Multiple Vitamin (MULTI-VITAMIN DAILY PO) 1 tablet    [provider]  naproxen sodium (ALEVE) 220 MG tablet Take 440 mg by mouth as needed.    [provider]  oxymetazoline (AFRIN) 0.05 %  nasal spray Place 1 spray into both nostrils 2 (two) times daily. Patient taking differently: Place 1 spray into both nostrils 2 (two) times daily as needed for congestion. 09/23/17   Georgette Shell, MD  pantoprazole (PROTONIX) 40 MG tablet Take 40 mg by mouth daily.    [provider]  tamsulosin (FLOMAX) 0.4 MG CAPS capsule SMARTSIG:1 Capsule(s) By Mouth Every Evening 07/29/20   [provider]  traMADol (ULTRAM) 50 MG tablet Take 1 tablet (50 mg total) by mouth every 6 (six) hours as needed for moderate pain. 08/09/20   Franchot Gallo, MD  valsartan  (DIOVAN) 320 MG tablet Take 320 mg by mouth daily.    [provider]  venlafaxine XR (EFFEXOR-XR) 150 MG 24 hr capsule Take 300 mg by mouth daily.  08/20/15   [provider]  XIIDRA 5 % SOLN  07/06/20   [provider]  zolpidem (AMBIEN) 10 MG tablet Take 10 mg by mouth at bedtime. 09/05/17   [provider]      Allergies    Pravastatin and Rosuvastatin    Review of Systems   Review of Systems  Physical Exam Updated Vital Signs BP (!) 140/70   Pulse 68   Temp 99.2 F (37.3 C) (Oral)   Resp 16   SpO2 97%  Physical Exam Vitals and nursing note reviewed.  Constitutional:      General: He is not in acute distress.    Appearance: Normal appearance.  HENT:     Head: Normocephalic and atraumatic.  Eyes:     Pupils: Pupils are equal, round, and reactive to light.  Cardiovascular:     Rate and Rhythm: Normal rate.  Pulmonary:     Effort: Pulmonary effort is normal.  Musculoskeletal:        General: Tenderness present.       Hands:     Cervical back: Normal range of motion and neck supple. No tenderness.  Skin:    General: Skin is warm and dry.  Neurological:     Mental Status: He is alert.  Psychiatric:        Mood and Affect: Mood normal.     ED Results / Procedures / Treatments   Labs (all labs ordered are listed, but only abnormal results are displayed) Labs Reviewed - No data to display  EKG None  Radiology No results found.  Procedures Procedures   LACERATION REPAIR Performed by: [employer] Authorized by: Blanchie Dessert Consent: Verbal consent obtained. Risks and benefits: risks, benefits and alternatives were discussed Consent given by: patient Patient identity confirmed: provided demographic data Prepped and Draped in normal sterile fashion Wound explored  Laceration Location: right hand  Laceration Length: 5cm  No Foreign Bodies seen or palpated  Anesthesia: local infiltration  Local anesthetic:  lidocaine 2% with epinephrine  Anesthetic total: 5 ml  Irrigation method: syringe Amount of cleaning: standard  Skin closure: 13  Number of sutures: 4.0 prolene  Technique: simple interrupted  Patient tolerance: Patient tolerated the procedure well with no immediate complications.  Medications Ordered in ED Medications  Tdap (BOOSTRIX) injection 0.5 mL (0.5 mLs Intramuscular Given 02/25/22 1445)    ED Course/ Medical Decision Making/ A&P                           Medical Decision Making Risk Prescription drug management.   Pt with multiple medical problems and comorbidities and presenting today with a complaint that  caries a high risk for morbidity and mortality.  Here today with laceration to the right hand.  Patient does not appear to have any tendon or nerve injury at this time and has full function of all fingers of the right hand.  Tetanus shot was updated.  Wound was repaired.  Low suspicion for foreign body or fracture.  No indication for x-ray at this time.  Patient is awake alert and does report hitting his head today when he fell but has been asymptomatic since the fall and does not take any anticoagulation.  Do not feel that patient warrants a CT of his head today.  There is no obvious trauma to the head either.  Findings discussed with the patient.  Wound care discussed.  Patient given return precautions.  Discharged home in good condition.         Final Clinical Impression(s) / ED Diagnoses Final diagnoses:  Laceration of right hand without foreign body, initial encounter    Rx / DC Orders ED Discharge Orders     None         Blanchie Dessert, MD 02/25/22 1458

## 2022-03-07 ENCOUNTER — Other Ambulatory Visit: Payer: Self-pay | Admitting: Physician Assistant

## 2022-03-07 MED ORDER — METOPROLOL TARTRATE 50 MG PO TABS: 50.0000 mg | ORAL_TABLET | Freq: Two times a day (BID) | ORAL | 0 refills | Status: DC

## 2022-03-28 ENCOUNTER — Other Ambulatory Visit: Payer: Self-pay | Admitting: Physician Assistant

## 2022-03-28 DIAGNOSIS — E7849 Other hyperlipidemia: Secondary | ICD-10-CM

## 2022-04-16 ENCOUNTER — Other Ambulatory Visit: Payer: Self-pay | Admitting: Physician Assistant

## 2022-04-16 DIAGNOSIS — E7849 Other hyperlipidemia: Secondary | ICD-10-CM

## 2022-04-18 ENCOUNTER — Other Ambulatory Visit: Payer: Self-pay

## 2022-04-18 MED ORDER — FUROSEMIDE 40 MG PO TABS: 40.0000 mg | ORAL_TABLET | Freq: Every day | ORAL | 0 refills | Status: DC

## 2022-06-04 ENCOUNTER — Other Ambulatory Visit: Payer: Self-pay | Admitting: Physician Assistant

## 2022-06-04 DIAGNOSIS — E7849 Other hyperlipidemia: Secondary | ICD-10-CM

## 2022-06-05 ENCOUNTER — Other Ambulatory Visit: Payer: Self-pay

## 2022-06-05 MED ORDER — METOPROLOL TARTRATE 50 MG PO TABS: 50.0000 mg | ORAL_TABLET | Freq: Two times a day (BID) | ORAL | 0 refills | Status: AC

## 2022-06-22 ENCOUNTER — Other Ambulatory Visit: Payer: Self-pay

## 2022-06-22 MED ORDER — ICOSAPENT ETHYL 1 G PO CAPS: ORAL_CAPSULE | ORAL | 0 refills | Status: DC

## 2022-07-19 DIAGNOSIS — E785 Hyperlipidemia, unspecified: Secondary | ICD-10-CM | POA: Diagnosis not present

## 2022-07-19 DIAGNOSIS — I2510 Atherosclerotic heart disease of native coronary artery without angina pectoris: Secondary | ICD-10-CM | POA: Diagnosis not present

## 2022-07-19 DIAGNOSIS — R7303 Prediabetes: Secondary | ICD-10-CM | POA: Diagnosis not present

## 2022-07-26 DIAGNOSIS — N182 Chronic kidney disease, stage 2 (mild): Secondary | ICD-10-CM | POA: Diagnosis not present

## 2022-07-26 DIAGNOSIS — I10 Essential (primary) hypertension: Secondary | ICD-10-CM | POA: Diagnosis not present

## 2022-07-26 DIAGNOSIS — R7303 Prediabetes: Secondary | ICD-10-CM | POA: Diagnosis not present

## 2022-07-26 DIAGNOSIS — I2510 Atherosclerotic heart disease of native coronary artery without angina pectoris: Secondary | ICD-10-CM | POA: Diagnosis not present

## 2022-07-26 DIAGNOSIS — E039 Hypothyroidism, unspecified: Secondary | ICD-10-CM | POA: Diagnosis not present

## 2022-07-26 DIAGNOSIS — Z8546 Personal history of malignant neoplasm of prostate: Secondary | ICD-10-CM | POA: Diagnosis not present

## 2022-07-26 DIAGNOSIS — E785 Hyperlipidemia, unspecified: Secondary | ICD-10-CM | POA: Diagnosis not present

## 2022-07-26 DIAGNOSIS — G4700 Insomnia, unspecified: Secondary | ICD-10-CM | POA: Diagnosis not present

## 2022-08-29 NOTE — Progress Notes (Signed)
Cardiology Office Note:    Date:  09/11/2022   ID:  Carl Humphrey, DOB November 28, 1955, MRN [id number]  PCP:  Carl Brunette, MD   HeartCare Providers Cardiologist:  Carl Alexander, MD     Referring MD: Carl Brunette, MD   Chief Complaint:  Follow-up     History of Present Illness:   Carl Humphrey is a 67 y.o. male  with history of PAF in 2017 without recurrence, nonobstructive CAD on coronary CTA 10/2017 70 percentile calcium score, 50 to 69% LAD with negative FFR, HTN, hypertriglyceridemia, chronic diastolic CHF, obesity, OSA on CPAP, heavy EtOH use.  Last echo 2019 LVEF 50% with grade 2 DD.   Patient was never anticoagulated for his atrial fib 2017 because his chads vas score was one at the time but when I saw him last year his CHA2DS2-VASc was 3.  Zio patch was  recommended but he never wore it   He was also having trouble with acute on chronic diastolic CHF that improved with doubling up on Lasix for 2 weeks.  I increase his Lasix to 40 mg daily.  I also asked him to cut back on alcohol intake.   I saw the patient 04/22/20 and chronic DOE, excessive ETOH.  I last saw the patient 04/2021 and overall stable.   Patient comes in for regular f/u. Chronic DOE. Denies chest pain, palpitations, edema. He doesn't do any regular exercise. He walked 2 miles a couple weeks ago. Dyspnea to start but levels out. Some dizziness with change of positions. Drinks 6 beers and a shot daily.  No chest pain, uses cpap.  Had prostate CA since he was last here treated with seed implants.  Had lab work a month ago. Eats out a lot some fast food and pizza.            Past Medical History:  Diagnosis Date   Anxiety    Arthritis    back lower pain at times   CAD (coronary artery disease)    a. nonobstructive by CT 2019.   Chronic diastolic CHF (congestive heart failure)    COVID jan 2021, dec  2021   rapid home test dec 2021 test due to exposure no symptoms   Depression    Dysrhythmia    AFIB    ETOH abuse    Former tobacco use    GERD (gastroesophageal reflux disease)    Headache    Hypertensive heart disease    Hypertriglyceridemia    Hypothyroidism    Obesity    Obstructive sleep apnea    a. On CPAP x ~ 10 yrs.   PAF (paroxysmal atrial fibrillation)    a. Dx 08/13/2015, without recurrence.   Pneumonia 2018   Pre-diabetes    Prostate cancer    Wears glasses    Current Medications: Current Meds  Medication Sig   Alirocumab (PRALUENT) 150 MG/ML SOAJ INJECT 150MG  INTO THE SKIN EVERY 14 DAYS   ARIPiprazole (ABILIFY) 2 MG tablet Take 1 tablet by mouth daily.   buPROPion (WELLBUTRIN XL) 150 MG 24 hr tablet Take 1 tablet by mouth every morning.   chlorhexidine (PERIDEX) 0.12 % solution    fenofibrate micronized (LOFIBRA) 200 MG capsule Take 200 mg by mouth daily.   furosemide (LASIX) 40 MG tablet Take 1 tablet (40 mg total) by mouth daily.   icosapent Ethyl (VASCEPA) 1 g capsule TAKE 2 CAPSULES 2 TIMES A DAY. PLEASE KEEP APPT   levothyroxine (SYNTHROID) 100 MCG tablet Take 1  tablet by mouth daily.   metoprolol tartrate (LOPRESSOR) 50 MG tablet Take 1 tablet (50 mg total) by mouth 2 (two) times daily.   pantoprazole (PROTONIX) 40 MG tablet Take 40 mg by mouth daily.   tamsulosin (FLOMAX) 0.4 MG CAPS capsule SMARTSIG:1 Capsule(s) By Mouth Every Evening   traMADol (ULTRAM) 50 MG tablet Take 1 tablet (50 mg total) by mouth every 6 (six) hours as needed for moderate pain.   valsartan (DIOVAN) 320 MG tablet Take 320 mg by mouth daily.   venlafaxine XR (EFFEXOR-XR) 150 MG 24 hr capsule Take 300 mg by mouth daily.    XIIDRA 5 % SOLN     Allergies:   Pravastatin and Rosuvastatin   Social History   Tobacco Use   Smoking status: Former    Packs/day: 4.00    Years: 15.00    Additional pack years: 0.00    Total pack years: 60.00    Types: Cigarettes    Quit date: 09/18/1989    Years since quitting: 33.0   Smokeless tobacco: Former    Types: Chew    Quit date: 09/18/1997   Vaping Use   Vaping Use: Never used  Substance Use Topics   Alcohol use: Yes    Alcohol/week: 2.0 standard drinks of alcohol    Types: 1 Cans of beer, 1 Shots of liquor per week    Comment: He drinks  6 beers/day.   Drug use: Not Currently    Types: Marijuana    Comment:  marijuana last used 1 week ago 3 as of 08-03-2020    Family Hx: The patient's family history includes Hypertension in his father and mother; Lung cancer in his father; Multiple sclerosis in his sister; Other in his sister and sister. There is no history of Colon cancer, Heart attack, or Stroke.  ROS     Physical Exam:    VS:  BP 138/88   Pulse (!) 58   Ht  (1.727 m)   Wt 231 lb (104.8 kg)   BMI 35.12 kg/m     Wt Readings from Last 3 Encounters:  09/11/22 231 lb (104.8 kg)  04/26/21 227 lb (103 kg)  09/16/20 231 lb (104.8 kg)    Physical Exam  GEN: Obesity, in no acute distress  Neck: no JVD, carotid bruits, or masses Cardiac:RRR; no murmurs, rubs, or gallops  Respiratory:  clear to auscultation bilaterally, normal work of breathing GI: soft, nontender, nondistended, + BS Ext: without cyanosis, clubbing, or edema, Good distal pulses bilaterally Neuro:  Alert and Oriented x 3,  Psych: euthymic mood, full affect        EKGs/Labs/Other Test Reviewed:    EKG:  EKG is  ordered today.  The ekg ordered today demonstrates sinus brady 58/m. Incomplete RBBB no acute change.  Recent Labs: No results found for requested labs within last 365 days.   Recent Lipid Panel No results for input(s): "CHOL", "TRIG", "HDL", "VLDL", "LDLCALC", "LDLDIRECT" in the last 8760 hours.   Prior CV Studies:    2D echo 2019   Study Conclusions   - Left ventricle: The cavity size was mildly dilated. Wall   thickness was increased in a pattern of mild LVH. Systolic   function was at the lower limits of normal. The estimated   ejection fraction was 50%. Wall motion was normal; there were no   regional wall motion  abnormalities. Features are consistent with   a pseudonormal left ventricular filling pattern, with concomitant   abnormal  relaxation and increased filling pressure (grade 2   diastolic dysfunction). Doppler parameters are consistent with   high ventricular filling pressure. - Mitral valve: There was mild regurgitation. - Left atrium: The atrium was mildly dilated. - Right atrium: The atrium was mildly dilated. - Atrial septum: No defect or patent foramen ovale was identified.   Risk Assessment/Calculations/Metrics:              ASSESSMENT & PLAN:   No problem-specific Assessment & Plan notes found for this encounter.   PAF in 2017 without recurrence.  CHA2DS2-VASc one at that time but is now 3.  Has never worn monitor provided to rule out recurrence.  Has had no symptoms and will continue to monitor.   CAD 50 to 69% LAD on coronary CTA 10/2017 FFR was negative no angina. Restart baby ASA   Essential hypertension blood pressure up initially and but repeat is good.   Chronic diastolic CHF no evidence of heart failure on exam. Edema controlled with Lasix but some dizziness when changing positions. Will try to decrease lasix 20 mg daily and can take an extra lasix if needed.    Hyperlipidemia on Praluent and fenofibrate will request labs from PCP   Obesity increase exercise and weight loss.   OSA compliant with CPAP   EtOH discussed decreasing alcohol intake with patient   Prostate cancer  s/p seed implants             Dispo:  No follow-ups on file.   Medication Adjustments/Labs and Tests Ordered: Current medicines are reviewed at length with the patient today.  Concerns regarding medicines are outlined above.  Tests Ordered: Orders Placed This Encounter  Procedures   EKG 12-Lead   Medication Changes: Meds ordered this encounter  Medications   fenofibrate 160 MG tablet    Sig: Take 200 mg daily by mouth   aspirin EC 81 MG tablet    Sig: 1 tablet    Dispense:  30  tablet   Signed, Jacolyn Reedy, PA-C  09/11/2022 2:52 PM    [facility] [address] [place], [place], [place]  [address] Phone: [phone]; Fax: [phone]

## 2022-09-11 ENCOUNTER — Encounter: Payer: Self-pay | Admitting: Physician Assistant

## 2022-09-11 ENCOUNTER — Ambulatory Visit: Payer: Medicare PPO | Attending: Physician Assistant | Admitting: Physician Assistant

## 2022-09-11 VITALS — BP 138/88 | HR 58 | Ht 68.0 in | Wt 231.0 lb

## 2022-09-11 DIAGNOSIS — I480 Paroxysmal atrial fibrillation: Secondary | ICD-10-CM

## 2022-09-11 DIAGNOSIS — I5032 Chronic diastolic (congestive) heart failure: Secondary | ICD-10-CM

## 2022-09-11 DIAGNOSIS — I2510 Atherosclerotic heart disease of native coronary artery without angina pectoris: Secondary | ICD-10-CM | POA: Diagnosis not present

## 2022-09-11 DIAGNOSIS — I10 Essential (primary) hypertension: Secondary | ICD-10-CM

## 2022-09-11 DIAGNOSIS — E7849 Other hyperlipidemia: Secondary | ICD-10-CM | POA: Diagnosis not present

## 2022-09-11 MED ORDER — ASPIRIN 81 MG PO TBEC: DELAYED_RELEASE_TABLET | ORAL | Status: AC

## 2022-09-11 MED ORDER — FENOFIBRATE 160 MG PO TABS: ORAL_TABLET | ORAL | Status: AC

## 2022-09-11 NOTE — Patient Instructions (Signed)
Medication Instructions:  Your physician has recommended you make the following change in your medication:  RESTART ASPIRIN 81 MG DAILY.  *If you need a refill on your cardiac medications before your next appointment, please call your pharmacy*   Lab Work: NONE If you have labs (blood work) drawn today and your tests are completely normal, you will receive your results only by: MyChart Message (if you have MyChart) OR A paper copy in the mail If you have any lab test that is abnormal or we need to change your treatment, we will call you to review the results.   Testing/Procedures: NONE   Follow-Up: At [facility], you and your health needs are our priority.  As part of our continuing mission to provide you with exceptional heart care, we have created designated Provider Care Teams.  These Care Teams include your primary Cardiologist (physician) and Advanced Practice Providers (APPs -  Physician Assistants and Nurse Practitioners) who all work together to provide you with the care you need, when you need it.  We recommend signing up for the patient portal called "MyChart".  Sign up information is provided on this After Visit Summary.  MyChart is used to connect with patients for Virtual Visits (Telemedicine).  Patients are able to view lab/test results, encounter notes, upcoming appointments, etc.  Non-urgent messages can be sent to your provider as well.   To learn more about what you can do with MyChart, go to [email or web address].    Your next appointment:   1 year(s)  Provider:   DR. Laurance Flatten

## 2022-09-13 DIAGNOSIS — H2513 Age-related nuclear cataract, bilateral: Secondary | ICD-10-CM | POA: Diagnosis not present

## 2022-09-13 DIAGNOSIS — H25013 Cortical age-related cataract, bilateral: Secondary | ICD-10-CM | POA: Diagnosis not present

## 2022-09-13 DIAGNOSIS — H04123 Dry eye syndrome of bilateral lacrimal glands: Secondary | ICD-10-CM | POA: Diagnosis not present

## 2022-09-22 ENCOUNTER — Other Ambulatory Visit: Payer: Self-pay

## 2022-09-22 MED ORDER — FUROSEMIDE 40 MG PO TABS: 40.0000 mg | ORAL_TABLET | Freq: Every day | ORAL | 3 refills | Status: DC

## 2022-10-18 ENCOUNTER — Other Ambulatory Visit: Payer: Self-pay | Admitting: *Deleted

## 2022-10-18 MED ORDER — ICOSAPENT ETHYL 1 G PO CAPS: 2.0000 g | ORAL_CAPSULE | Freq: Two times a day (BID) | ORAL | 2 refills | Status: DC

## 2022-10-19 DIAGNOSIS — C61 Malignant neoplasm of prostate: Secondary | ICD-10-CM | POA: Diagnosis not present

## 2022-10-19 DIAGNOSIS — Z8546 Personal history of malignant neoplasm of prostate: Secondary | ICD-10-CM | POA: Diagnosis not present

## 2022-10-25 DIAGNOSIS — N5201 Erectile dysfunction due to arterial insufficiency: Secondary | ICD-10-CM | POA: Diagnosis not present

## 2022-10-25 DIAGNOSIS — Z8546 Personal history of malignant neoplasm of prostate: Secondary | ICD-10-CM | POA: Diagnosis not present

## 2023-01-18 DIAGNOSIS — E039 Hypothyroidism, unspecified: Secondary | ICD-10-CM | POA: Diagnosis not present

## 2023-01-18 DIAGNOSIS — E785 Hyperlipidemia, unspecified: Secondary | ICD-10-CM | POA: Diagnosis not present

## 2023-01-18 DIAGNOSIS — E559 Vitamin D deficiency, unspecified: Secondary | ICD-10-CM | POA: Diagnosis not present

## 2023-01-18 DIAGNOSIS — R7309 Other abnormal glucose: Secondary | ICD-10-CM | POA: Diagnosis not present

## 2023-01-18 DIAGNOSIS — Z125 Encounter for screening for malignant neoplasm of prostate: Secondary | ICD-10-CM | POA: Diagnosis not present

## 2023-01-18 DIAGNOSIS — I129 Hypertensive chronic kidney disease with stage 1 through stage 4 chronic kidney disease, or unspecified chronic kidney disease: Secondary | ICD-10-CM | POA: Diagnosis not present

## 2023-01-18 LAB — LAB REPORT - SCANNED
A1c: 5.9
EGFR: 91

## 2023-01-24 DIAGNOSIS — E785 Hyperlipidemia, unspecified: Secondary | ICD-10-CM | POA: Diagnosis not present

## 2023-01-24 DIAGNOSIS — E559 Vitamin D deficiency, unspecified: Secondary | ICD-10-CM | POA: Diagnosis not present

## 2023-01-24 DIAGNOSIS — I10 Essential (primary) hypertension: Secondary | ICD-10-CM | POA: Diagnosis not present

## 2023-01-24 DIAGNOSIS — K429 Umbilical hernia without obstruction or gangrene: Secondary | ICD-10-CM | POA: Diagnosis not present

## 2023-01-24 DIAGNOSIS — R7303 Prediabetes: Secondary | ICD-10-CM | POA: Diagnosis not present

## 2023-01-24 DIAGNOSIS — Z0000 Encounter for general adult medical examination without abnormal findings: Secondary | ICD-10-CM | POA: Diagnosis not present

## 2023-01-24 DIAGNOSIS — E039 Hypothyroidism, unspecified: Secondary | ICD-10-CM | POA: Diagnosis not present

## 2023-01-24 DIAGNOSIS — I2510 Atherosclerotic heart disease of native coronary artery without angina pectoris: Secondary | ICD-10-CM | POA: Diagnosis not present

## 2023-01-24 DIAGNOSIS — G4733 Obstructive sleep apnea (adult) (pediatric): Secondary | ICD-10-CM | POA: Diagnosis not present

## 2023-01-25 ENCOUNTER — Encounter: Payer: Self-pay | Admitting: Internal Medicine

## 2023-03-29 ENCOUNTER — Encounter: Payer: Self-pay | Admitting: Gastroenterology

## 2023-04-04 ENCOUNTER — Other Ambulatory Visit: Payer: Self-pay

## 2023-04-04 ENCOUNTER — Other Ambulatory Visit (HOSPITAL_COMMUNITY): Payer: Self-pay

## 2023-04-04 MED ORDER — WEGOVY 0.5 MG/0.5ML ~~LOC~~ SOAJ
0.5000 mg | SUBCUTANEOUS | 3 refills | Status: DC
  Filled 2023-04-04: qty 2, 28d supply, fill #0

## 2023-04-04 MED ORDER — SEMAGLUTIDE-WEIGHT MANAGEMENT 0.25 MG/0.5ML ~~LOC~~ SOAJ
0.2500 mg | SUBCUTANEOUS | 0 refills | Status: DC
  Filled 2023-04-04 (×2): qty 2, 28d supply, fill #0

## 2023-04-05 ENCOUNTER — Other Ambulatory Visit (HOSPITAL_COMMUNITY): Payer: Self-pay

## 2023-04-06 ENCOUNTER — Other Ambulatory Visit (HOSPITAL_COMMUNITY): Payer: Self-pay

## 2023-04-23 ENCOUNTER — Other Ambulatory Visit: Payer: Self-pay | Admitting: Pharmacist

## 2023-04-23 ENCOUNTER — Other Ambulatory Visit: Payer: Self-pay

## 2023-04-23 DIAGNOSIS — H02112 Cicatricial ectropion of right lower eyelid: Secondary | ICD-10-CM | POA: Diagnosis not present

## 2023-04-23 DIAGNOSIS — H16212 Exposure keratoconjunctivitis, left eye: Secondary | ICD-10-CM | POA: Diagnosis not present

## 2023-04-23 DIAGNOSIS — H0279 Other degenerative disorders of eyelid and periocular area: Secondary | ICD-10-CM | POA: Diagnosis not present

## 2023-04-23 DIAGNOSIS — H02535 Eyelid retraction left lower eyelid: Secondary | ICD-10-CM | POA: Diagnosis not present

## 2023-04-23 DIAGNOSIS — H02135 Senile ectropion of left lower eyelid: Secondary | ICD-10-CM | POA: Diagnosis not present

## 2023-04-23 DIAGNOSIS — H02115 Cicatricial ectropion of left lower eyelid: Secondary | ICD-10-CM | POA: Diagnosis not present

## 2023-04-23 DIAGNOSIS — E7849 Other hyperlipidemia: Secondary | ICD-10-CM

## 2023-04-23 DIAGNOSIS — H04123 Dry eye syndrome of bilateral lacrimal glands: Secondary | ICD-10-CM | POA: Diagnosis not present

## 2023-04-23 DIAGNOSIS — H0520 Unspecified exophthalmos: Secondary | ICD-10-CM | POA: Diagnosis not present

## 2023-04-23 DIAGNOSIS — H04522 Eversion of left lacrimal punctum: Secondary | ICD-10-CM | POA: Diagnosis not present

## 2023-04-23 MED ORDER — PRALUENT 150 MG/ML ~~LOC~~ SOAJ: SUBCUTANEOUS | 3 refills | Status: AC

## 2023-04-23 MED ORDER — ICOSAPENT ETHYL 1 G PO CAPS: 2.0000 g | ORAL_CAPSULE | Freq: Two times a day (BID) | ORAL | 1 refills | Status: DC

## 2023-05-04 DIAGNOSIS — H02532 Eyelid retraction right lower eyelid: Secondary | ICD-10-CM | POA: Diagnosis not present

## 2023-05-04 DIAGNOSIS — H16223 Keratoconjunctivitis sicca, not specified as Sjogren's, bilateral: Secondary | ICD-10-CM | POA: Diagnosis not present

## 2023-05-04 DIAGNOSIS — H04213 Epiphora due to excess lacrimation, bilateral lacrimal glands: Secondary | ICD-10-CM | POA: Diagnosis not present

## 2023-05-04 DIAGNOSIS — H02535 Eyelid retraction left lower eyelid: Secondary | ICD-10-CM | POA: Diagnosis not present

## 2023-05-04 DIAGNOSIS — H0279 Other degenerative disorders of eyelid and periocular area: Secondary | ICD-10-CM | POA: Diagnosis not present

## 2023-07-24 DIAGNOSIS — R7303 Prediabetes: Secondary | ICD-10-CM | POA: Diagnosis not present

## 2023-07-24 LAB — LAB REPORT - SCANNED
A1c: 5
EGFR: 70

## 2023-07-31 DIAGNOSIS — K219 Gastro-esophageal reflux disease without esophagitis: Secondary | ICD-10-CM | POA: Diagnosis not present

## 2023-07-31 DIAGNOSIS — I10 Essential (primary) hypertension: Secondary | ICD-10-CM | POA: Diagnosis not present

## 2023-07-31 DIAGNOSIS — N182 Chronic kidney disease, stage 2 (mild): Secondary | ICD-10-CM | POA: Diagnosis not present

## 2023-07-31 DIAGNOSIS — I2510 Atherosclerotic heart disease of native coronary artery without angina pectoris: Secondary | ICD-10-CM | POA: Diagnosis not present

## 2023-07-31 DIAGNOSIS — E785 Hyperlipidemia, unspecified: Secondary | ICD-10-CM | POA: Diagnosis not present

## 2023-07-31 DIAGNOSIS — J321 Chronic frontal sinusitis: Secondary | ICD-10-CM | POA: Diagnosis not present

## 2023-09-23 ENCOUNTER — Other Ambulatory Visit: Payer: Self-pay | Admitting: Physician Assistant

## 2023-10-18 DIAGNOSIS — L821 Other seborrheic keratosis: Secondary | ICD-10-CM | POA: Diagnosis not present

## 2023-10-18 DIAGNOSIS — D225 Melanocytic nevi of trunk: Secondary | ICD-10-CM | POA: Diagnosis not present

## 2023-10-18 DIAGNOSIS — Z85828 Personal history of other malignant neoplasm of skin: Secondary | ICD-10-CM | POA: Diagnosis not present

## 2023-10-18 DIAGNOSIS — R233 Spontaneous ecchymoses: Secondary | ICD-10-CM | POA: Diagnosis not present

## 2023-10-18 DIAGNOSIS — L570 Actinic keratosis: Secondary | ICD-10-CM | POA: Diagnosis not present

## 2023-10-18 DIAGNOSIS — L814 Other melanin hyperpigmentation: Secondary | ICD-10-CM | POA: Diagnosis not present

## 2023-10-18 DIAGNOSIS — Z08 Encounter for follow-up examination after completed treatment for malignant neoplasm: Secondary | ICD-10-CM | POA: Diagnosis not present

## 2023-10-20 ENCOUNTER — Other Ambulatory Visit: Payer: Self-pay | Admitting: Physician Assistant

## 2023-10-23 ENCOUNTER — Telehealth: Payer: Self-pay | Admitting: Physician Assistant

## 2023-10-23 NOTE — Telephone Encounter (Signed)
 Call back to patient who states he has been experiencing worsening SOB on exertion for 6 months. It is sometimes accompanied by poor balance, but he is also having episodes of poor balance independently of the SOB on exertion. He states his SOB is relieved when he rests for 10 mins. He states he has to [employer] of a rail or a wall/piece of furniture when he gets dizzy, or sit down.  He denies any chest pain or leg swelling. He gives his weight as 193 lb and endorses taking his lasix  and valsartan daily. He states he does not frequently track his BP or HR at home but his most recent BP was 130/50. He is still performing all ADL's such as driving and going to work, and he also endorses that he drinks 5 beers a day. Last seen April 2024. Appt made with DOD for 10/25/23. ED precautions discussed, patient verbalizes understanding to call 911 or go to ED if balance progresses to weakness especially on one side of the body or syncope. Also advised to call 911 or go to ED if SOB is not relieved with rest, especially if it is accompanied by chest pain.

## 2023-10-23 NOTE — Telephone Encounter (Signed)
 Pt c/o Shortness Of Breath: STAT if SOB developed within the last 24 hours or pt is noticeably SOB on the phone  1. Are you currently SOB (can you hear that pt is SOB on the phone)? No   2. How long have you been experiencing SOB?  Past 6 months   3. Are you SOB when sitting or when up moving around?  With exertion  4. Are you currently experiencing any other symptoms?  Dizziness the past 6 months--pt hasn't fainted or checked BP/HR.

## 2023-10-24 NOTE — Progress Notes (Unsigned)
 Cardiology Office Note:   Date:  10/25/2023  ID:  Carl Humphrey, DOB 1955/11/21, MRN [id number] PCP: Imelda Man, MD  [facility] Providers Cardiologist:  Christoper Crafts, MD {  History of Present Illness:   Carl Humphrey is a 68 y.o. male I have not seen him before.  He previously saw Dr. Nicholette Barley and Reesa Cannon. PAc.  He has a history of PAF in 2017 without recurrence, nonobstructive CAD on coronary CTA with  50 to 69% LAD with negative FFR, and normal EF on echo in 2019.  He has had a history of chronic dyspnea on exertion.  He was drinking 6 beers and shot daily.  He reports that this is five days per week.    He has been having increased shortness of breath with activity.  He has been noticing this doing routine activities like walking on level ground.  He says he would be able to walk up the stairs without being significantly short of breath.  He tries to avoid them at his house.  He is not having any resting shortness of breath, PND or orthopnea.  He is not having any palpitations, presyncope or syncope.  He feels extremely fatigued when he is doing activity.  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  His biggest issue has been getting very dizzy just when he is standing.  He is avoided falls.  ROS: As stated in the HPI and negative for all other systems.  Studies Reviewed:    EKG:   EKG Interpretation Date/Time:  Thursday October 25 2023 15:19:29 EDT Ventricular Rate:  65 PR Interval:  174 QRS Duration:  110 QT Interval:  414 QTC Calculation: 430 R Axis:   -43  Text Interpretation: Normal sinus rhythm Left axis deviation Incomplete right bundle branch block Nonspecific T wave abnormality When compared with ECG of 09-Aug-2020 06:39, No significant change was found Confirmed by Eilleen Grates ([id number]) on 10/25/2023 3:40:36 PM     Risk Assessment/Calculations:     Physical Exam:   VS:  BP (!) 146/74 (BP Location: Left Arm,  Patient Position: Sitting, Cuff Size: Normal)   Pulse 64   Ht 5\' 8"  (1.727 m)   Wt 190 lb (86.2 kg)   SpO2 98%   BMI 28.89 kg/m    Wt Readings from Last 3 Encounters:  10/25/23 190 lb (86.2 kg)  09/11/22 231 lb (104.8 kg)  04/26/21 227 lb (103 kg)     GEN: Well nourished, well developed in no acute distress NECK: No JVD; No carotid bruits CARDIAC: RRR, no murmurs, rubs, gallops RESPIRATORY:  Clear to auscultation without rales, wheezing or rhonchi  ABDOMEN: Soft, non-tender, non-distended EXTREMITIES:  No edema; No deformity   ASSESSMENT AND PLAN:   PAF: He has not had any recurrence of this.  No change in therapy.  CAD: He has progressive shortness of breath with nonobstructive disease previously.  He needs aggressive risk reduction.  He also needs to be screened for progressive disease with PET testing.  He would be able to walk on a treadmill.    Essential hypertension: The blood pressure is at target.  No change in therapy      Chronic diastolic CHF :    He has had a mildly reduced ejection fraction.  We can assess this at the time of his PET. I would also like to check a BNP level.    Hyperlipidemia : He has  an excellent lipid profile.  No change in therapy.  OSA : He has previously been compliant with CPAP.   EtOH : I suggest a dramatic decrease in his alcohol intake.       Follow up with me in 1 year or sooner based on results of the above.  Signed, Eilleen Grates, MD

## 2023-10-25 ENCOUNTER — Encounter: Payer: Self-pay | Admitting: Cardiology

## 2023-10-25 ENCOUNTER — Ambulatory Visit: Attending: Cardiology | Admitting: Cardiology

## 2023-10-25 VITALS — BP 146/74 | HR 64 | Ht 68.0 in | Wt 190.0 lb

## 2023-10-25 DIAGNOSIS — E785 Hyperlipidemia, unspecified: Secondary | ICD-10-CM

## 2023-10-25 DIAGNOSIS — I480 Paroxysmal atrial fibrillation: Secondary | ICD-10-CM | POA: Diagnosis not present

## 2023-10-25 DIAGNOSIS — I2510 Atherosclerotic heart disease of native coronary artery without angina pectoris: Secondary | ICD-10-CM | POA: Diagnosis not present

## 2023-10-25 DIAGNOSIS — I10 Essential (primary) hypertension: Secondary | ICD-10-CM | POA: Diagnosis not present

## 2023-10-25 DIAGNOSIS — R0602 Shortness of breath: Secondary | ICD-10-CM

## 2023-10-25 NOTE — Patient Instructions (Addendum)
 Medication Instructions:  Your physician recommends that you continue on your current medications as directed. Please refer to the Current Medication list given to you today.  *If you need a refill on your cardiac medications before your next appointment, please call your pharmacy*  Lab Work: BNP today If you have labs (blood work) drawn today and your tests are completely normal, you will receive your results only by: MyChart Message (if you have MyChart) OR A paper copy in the mail If you have any lab test that is abnormal or we need to change your treatment, we will call you to review the results.  Testing/Procedures: Pet/CT Stress Test  Follow-Up: At [facility], you and your health needs are our priority.  As part of our continuing mission to provide you with exceptional heart care, our providers are all part of one team.  This team includes your primary Cardiologist (physician) and Advanced Practice Providers or APPs (Physician Assistants and Nurse Practitioners) who all work together to provide you with the care you need, when you need it.  Your next appointment:   1 year  Provider:   Lavonne Prairie, MD  We recommend signing up for the patient portal called "MyChart".  Sign up information is provided on this After Visit Summary.  MyChart is used to connect with patients for Virtual Visits (Telemedicine).  Patients are able to view lab/test results, encounter notes, upcoming appointments, etc.  Non-urgent messages can be sent to your provider as well.   To learn more about what you can do with MyChart, go to [email or web address].   Other Instructions    Please report to Radiology at the [facility] Main Entrance 30 minutes early for your test.  [address] [place], [place] [address]  How to Prepare for Your Cardiac PET/CT Stress Test:  Nothing to eat or drink, except water , 3 hours prior to arrival time.  NO caffeine/decaffeinated products, or chocolate  12 hours prior to arrival. (Please note decaffeinated beverages (teas/coffees) still contain caffeine).  If you have caffeine within 12 hours prior, the test will need to be rescheduled.  Medication instructions: Do not take erectile dysfunction medications for 72 hours prior to test (sildenafil, tadalafil) Do not take nitrates (isosorbide mononitrate, Ranexa) the day before or day of test Do not take tamsulosin the day before or morning of test Hold theophylline containing medications for 12 hours. Hold Dipyridamole 48 hours prior to the test.  Diabetic Preparation: If able to eat breakfast prior to 3 hour fasting, you may take all medications, including your insulin. Do not worry if you miss your breakfast dose of insulin - start at your next meal. If you do not eat prior to 3 hour fast-Hold all diabetes (oral and insulin) medications. Patients who wear a continuous glucose monitor MUST remove the device prior to scanning.  You may take your remaining medications with water .  NO perfume, cologne or lotion on chest or abdomen area. FEMALES - Please avoid wearing dresses to this appointment.  Total time is 1 to 2 hours; you may want to bring reading material for the waiting time.  IF YOU THINK YOU MAY BE PREGNANT, OR ARE NURSING PLEASE INFORM THE TECHNOLOGIST.  In preparation for your appointment, medication and supplies will be purchased.  Appointment availability is limited, so if you need to cancel or reschedule, please call the Radiology Department Scheduler at [phone] 24 hours in advance to avoid a cancellation fee of $100.00  What to Expect When  you Arrive:  Once you arrive and check in for your appointment, you will be taken to a preparation room within the Radiology Department.  A technologist or Nurse will obtain your medical history, verify that you are correctly prepped for the exam, and explain the procedure.  Afterwards, an IV will be started in your arm and electrodes  will be placed on your skin for EKG monitoring during the stress portion of the exam. Then you will be escorted to the PET/CT scanner.  There, staff will get you positioned on the scanner and obtain a blood pressure and EKG.  During the exam, you will continue to be connected to the EKG and blood pressure machines.  A small, safe amount of a radioactive tracer will be injected in your IV to obtain a series of pictures of your heart along with an injection of a stress agent.    After your Exam:  It is recommended that you eat a meal and drink a caffeinated beverage to counter act any effects of the stress agent.  Drink plenty of fluids for the remainder of the day and urinate frequently for the first couple of hours after the exam.  Your doctor will inform you of your test results within 7-10 business days.  For more information and frequently asked questions, please visit our website: [email or web address]  For questions about your test or how to prepare for your test, please call: Cardiac Imaging Nurse Navigators Office: [phone]

## 2023-10-26 ENCOUNTER — Telehealth: Payer: Self-pay | Admitting: *Deleted

## 2023-10-26 LAB — PRO B NATRIURETIC PEPTIDE: NT-Pro BNP: 568 pg/mL — ABNORMAL HIGH (ref 0–376)

## 2023-10-26 NOTE — Telephone Encounter (Signed)
 Called and spoke to pt about his Driver's License being left at [facility] here on [address] [facility]. He states he was here on 10/25/23 and did not realize that he had left his DL. He will go to Lab and pick it up later this afternoon before 4:30 pm. Carl Humphrey in Lab notified Triage nurse. She will have DL. Pt verbalized understanding.

## 2023-10-28 ENCOUNTER — Ambulatory Visit: Payer: Self-pay | Admitting: Cardiology

## 2023-10-28 ENCOUNTER — Other Ambulatory Visit: Payer: Self-pay | Admitting: Physician Assistant

## 2023-10-28 DIAGNOSIS — I5032 Chronic diastolic (congestive) heart failure: Secondary | ICD-10-CM

## 2023-10-30 ENCOUNTER — Telehealth (HOSPITAL_COMMUNITY): Payer: Self-pay | Admitting: Emergency Medicine

## 2023-10-30 NOTE — Telephone Encounter (Signed)
Attempted to call patient regarding upcoming cardiac PET appointment. Left message on voicemail with name and callback number Aidan Moten RN Navigator Cardiac Imaging [facility] [phone] Office [phone] Cell  

## 2023-10-30 NOTE — Telephone Encounter (Signed)
 Reaching out to patient to offer assistance regarding upcoming cardiac imaging study; pt verbalizes understanding of appt date/time, parking situation and where to check in, pre-test NPO status and medications ordered, and verified current allergies; name and call back number provided for further questions should they arise Rockwell [place] RN Navigator Cardiac Imaging [facility] [phone] office [phone] cell

## 2023-10-31 ENCOUNTER — Ambulatory Visit (HOSPITAL_COMMUNITY)
Admission: RE | Admit: 2023-10-31 | Discharge: 2023-10-31 | Disposition: A | Discharge status: Home / Self Care | Source: Ambulatory Visit | Attending: Cardiology | Admitting: Cardiology

## 2023-10-31 DIAGNOSIS — I2510 Atherosclerotic heart disease of native coronary artery without angina pectoris: Secondary | ICD-10-CM | POA: Insufficient documentation

## 2023-10-31 DIAGNOSIS — R0602 Shortness of breath: Secondary | ICD-10-CM | POA: Diagnosis not present

## 2023-10-31 LAB — NM PET CT CARDIAC PERFUSION MULTI W/ABSOLUTE BLOODFLOW
LV dias vol: 136 mL (ref 62–150)
LV sys vol: 83 mL
MBFR: 1.83
Nuc Rest EF: 39 %
Nuc Stress EF: 46 %
Rest MBF: 0.64 ml/g/min
Rest Nuclear Isotope Dose: 22.4 mCi
ST Depression (mm): 0 mm
Stress MBF: 1.17 ml/g/min
Stress Nuclear Isotope Dose: 22.5 mCi

## 2023-10-31 MED ORDER — RUBIDIUM RB82 GENERATOR (RUBYFILL)
22.4100 | PACK | Freq: Once | INTRAVENOUS | Status: AC
  Administered 2023-10-31: 22.41 via INTRAVENOUS

## 2023-10-31 MED ORDER — REGADENOSON 0.4 MG/5ML IV SOLN
INTRAVENOUS | Status: AC
  Filled 2023-10-31: qty 5

## 2023-10-31 MED ORDER — REGADENOSON 0.4 MG/5ML IV SOLN
0.4000 mg | Freq: Once | INTRAVENOUS | Status: AC
  Administered 2023-10-31: 0.4 mg via INTRAVENOUS

## 2023-10-31 MED ORDER — RUBIDIUM RB82 GENERATOR (RUBYFILL)
22.4500 | PACK | Freq: Once | INTRAVENOUS | Status: AC
  Administered 2023-10-31: 22.45 via INTRAVENOUS

## 2023-10-31 NOTE — Progress Notes (Signed)
 Pt. Tolerated lexi scan well.

## 2023-11-02 NOTE — Telephone Encounter (Signed)
-----   Message from Eilleen Grates sent at 11/01/2023  8:55 AM EDT ----- No ischemia or infarct.  The EF is mildly reduced and he needs follow up echocardiogram given this and the elevated BNP.  Please schedule.  Call Mr. Cutshaw with the results and send results to Imelda Man, MD ----- Message ----- From: Interface, Rad Results In Sent: 10/31/2023   2:30 PM EDT To: Eilleen Grates, MD

## 2023-11-02 NOTE — Telephone Encounter (Signed)
 Spoke with pt regarding his results. Pt aware of repeat echocardiogram. Echo ordered. Pt verbalized understanding. All questions if any were answered. Results sent to PCP.

## 2023-11-06 ENCOUNTER — Ambulatory Visit: Admitting: General Practice

## 2023-11-14 DIAGNOSIS — L570 Actinic keratosis: Secondary | ICD-10-CM | POA: Diagnosis not present

## 2023-11-14 DIAGNOSIS — D044 Carcinoma in situ of skin of scalp and neck: Secondary | ICD-10-CM | POA: Diagnosis not present

## 2023-11-19 ENCOUNTER — Ambulatory Visit (HOSPITAL_COMMUNITY)
Admission: RE | Admit: 2023-11-19 | Discharge: 2023-11-19 | Disposition: A | Discharge status: Home / Self Care | Source: Ambulatory Visit | Attending: Cardiovascular Disease | Admitting: Cardiovascular Disease

## 2023-11-19 ENCOUNTER — Ambulatory Visit: Payer: Self-pay | Admitting: Cardiology

## 2023-11-19 DIAGNOSIS — I5032 Chronic diastolic (congestive) heart failure: Secondary | ICD-10-CM | POA: Diagnosis not present

## 2023-11-19 LAB — ECHOCARDIOGRAM COMPLETE
Area-P 1/2: 2.87 cm2
S' Lateral: 3.5 cm

## 2023-11-20 DIAGNOSIS — H25813 Combined forms of age-related cataract, bilateral: Secondary | ICD-10-CM | POA: Diagnosis not present

## 2023-11-20 DIAGNOSIS — H5203 Hypermetropia, bilateral: Secondary | ICD-10-CM | POA: Diagnosis not present

## 2023-12-13 ENCOUNTER — Ambulatory Visit (HOSPITAL_BASED_OUTPATIENT_CLINIC_OR_DEPARTMENT_OTHER)
Admission: RE | Admit: 2023-12-13 | Discharge: 2023-12-13 | Disposition: A | Discharge status: Home / Self Care | Source: Ambulatory Visit | Attending: Registered Nurse | Admitting: Registered Nurse

## 2023-12-13 ENCOUNTER — Other Ambulatory Visit (HOSPITAL_COMMUNITY): Payer: Self-pay | Admitting: Registered Nurse

## 2023-12-13 DIAGNOSIS — R0609 Other forms of dyspnea: Secondary | ICD-10-CM

## 2023-12-13 DIAGNOSIS — I2510 Atherosclerotic heart disease of native coronary artery without angina pectoris: Secondary | ICD-10-CM | POA: Diagnosis not present

## 2023-12-13 DIAGNOSIS — F17211 Nicotine dependence, cigarettes, in remission: Secondary | ICD-10-CM | POA: Diagnosis not present

## 2023-12-13 DIAGNOSIS — I509 Heart failure, unspecified: Secondary | ICD-10-CM | POA: Diagnosis not present

## 2023-12-13 DIAGNOSIS — M6289 Other specified disorders of muscle: Secondary | ICD-10-CM | POA: Diagnosis not present

## 2023-12-13 DIAGNOSIS — R918 Other nonspecific abnormal finding of lung field: Secondary | ICD-10-CM | POA: Diagnosis not present

## 2023-12-13 DIAGNOSIS — R9389 Abnormal findings on diagnostic imaging of other specified body structures: Secondary | ICD-10-CM | POA: Diagnosis not present

## 2023-12-13 DIAGNOSIS — E7800 Pure hypercholesterolemia, unspecified: Secondary | ICD-10-CM | POA: Diagnosis not present

## 2023-12-13 DIAGNOSIS — R0602 Shortness of breath: Secondary | ICD-10-CM | POA: Diagnosis not present

## 2023-12-13 DIAGNOSIS — J441 Chronic obstructive pulmonary disease with (acute) exacerbation: Secondary | ICD-10-CM | POA: Diagnosis not present

## 2023-12-13 DIAGNOSIS — R0902 Hypoxemia: Secondary | ICD-10-CM | POA: Diagnosis not present

## 2023-12-13 MED ORDER — IOHEXOL 350 MG/ML SOLN
75.0000 mL | Freq: Once | INTRAVENOUS | Status: AC | PRN
  Administered 2023-12-13: 75 mL via INTRAVENOUS

## 2023-12-17 ENCOUNTER — Other Ambulatory Visit (HOSPITAL_COMMUNITY): Payer: Self-pay | Admitting: Radiology

## 2023-12-17 DIAGNOSIS — R9389 Abnormal findings on diagnostic imaging of other specified body structures: Secondary | ICD-10-CM

## 2023-12-19 ENCOUNTER — Other Ambulatory Visit: Payer: Self-pay | Admitting: Physician Assistant

## 2023-12-19 DIAGNOSIS — R0609 Other forms of dyspnea: Secondary | ICD-10-CM | POA: Diagnosis not present

## 2023-12-19 DIAGNOSIS — M6289 Other specified disorders of muscle: Secondary | ICD-10-CM | POA: Diagnosis not present

## 2023-12-25 DIAGNOSIS — R0609 Other forms of dyspnea: Secondary | ICD-10-CM | POA: Diagnosis not present

## 2023-12-25 DIAGNOSIS — M6289 Other specified disorders of muscle: Secondary | ICD-10-CM | POA: Diagnosis not present

## 2023-12-26 LAB — LAB REPORT - SCANNED: EGFR: 73

## 2023-12-27 ENCOUNTER — Ambulatory Visit: Payer: Self-pay | Admitting: Cardiology

## 2024-01-11 DIAGNOSIS — H02535 Eyelid retraction left lower eyelid: Secondary | ICD-10-CM | POA: Diagnosis not present

## 2024-01-11 DIAGNOSIS — H0102A Squamous blepharitis right eye, upper and lower eyelids: Secondary | ICD-10-CM | POA: Diagnosis not present

## 2024-01-11 DIAGNOSIS — H16223 Keratoconjunctivitis sicca, not specified as Sjogren's, bilateral: Secondary | ICD-10-CM | POA: Diagnosis not present

## 2024-01-11 DIAGNOSIS — H0288B Meibomian gland dysfunction left eye, upper and lower eyelids: Secondary | ICD-10-CM | POA: Diagnosis not present

## 2024-01-11 DIAGNOSIS — H0102B Squamous blepharitis left eye, upper and lower eyelids: Secondary | ICD-10-CM | POA: Diagnosis not present

## 2024-01-11 DIAGNOSIS — H02532 Eyelid retraction right lower eyelid: Secondary | ICD-10-CM | POA: Diagnosis not present

## 2024-01-11 DIAGNOSIS — H0279 Other degenerative disorders of eyelid and periocular area: Secondary | ICD-10-CM | POA: Diagnosis not present

## 2024-01-11 DIAGNOSIS — H04223 Epiphora due to insufficient drainage, bilateral lacrimal glands: Secondary | ICD-10-CM | POA: Diagnosis not present

## 2024-01-11 DIAGNOSIS — H0288A Meibomian gland dysfunction right eye, upper and lower eyelids: Secondary | ICD-10-CM | POA: Diagnosis not present

## 2024-01-15 DIAGNOSIS — J441 Chronic obstructive pulmonary disease with (acute) exacerbation: Secondary | ICD-10-CM | POA: Diagnosis not present

## 2024-01-15 DIAGNOSIS — G4733 Obstructive sleep apnea (adult) (pediatric): Secondary | ICD-10-CM | POA: Diagnosis not present

## 2024-01-15 DIAGNOSIS — R9389 Abnormal findings on diagnostic imaging of other specified body structures: Secondary | ICD-10-CM | POA: Diagnosis not present

## 2024-01-15 DIAGNOSIS — I2510 Atherosclerotic heart disease of native coronary artery without angina pectoris: Secondary | ICD-10-CM | POA: Diagnosis not present

## 2024-01-17 DIAGNOSIS — L578 Other skin changes due to chronic exposure to nonionizing radiation: Secondary | ICD-10-CM | POA: Diagnosis not present

## 2024-01-17 DIAGNOSIS — L570 Actinic keratosis: Secondary | ICD-10-CM | POA: Diagnosis not present

## 2024-01-17 DIAGNOSIS — Z85828 Personal history of other malignant neoplasm of skin: Secondary | ICD-10-CM | POA: Diagnosis not present

## 2024-01-17 DIAGNOSIS — Z08 Encounter for follow-up examination after completed treatment for malignant neoplasm: Secondary | ICD-10-CM | POA: Diagnosis not present

## 2024-01-17 NOTE — Progress Notes (Unsigned)
  Cardiology Office Note:   Date:  01/18/2024  ID:  Carl Humphrey, DOB [birth year], MRN [id number] PCP: Clarice Nottingham, MD  [facility] Providers Cardiologist:  Leim Moose, MD {  History of Present Illness:   Carl Humphrey is a 68 y.o. male He previously saw Dr. Moose and Rosaline Pavy. PAc.  He has a history of PAF in 2017 without recurrence, nonobstructive CAD on coronary CTA with  50 to 69% LAD with negative FFR, and normal EF on echo in 2019.  He has had a history of chronic dyspnea on exertion.  He was drinking 6 beers and shot daily.  He reports that this is five days per week.    He presents for follow up.  He had a stress perfusion study that was negative for any evidence of ischemia.  Echocardiogram was unremarkable.  We talked about the positional nature of his symptoms and he has since had improvement in his SOB and dizziness.  The patient denies any new symptoms such as chest discomfort, neck or arm discomfort. There has been no new shortness of breath, PND or orthopnea. There have been no reported palpitations, presyncope or syncope.   He is having a cough productive of grey sputum and feels like he has sinus congestion.    ROS: As stated in the HPI and negative for all other systems.  Studies Reviewed:    EKG:    NA  Risk Assessment/Calculations:              Physical Exam:   VS:  BP 119/82 (BP Location: Left Arm, Patient Position: Sitting, Cuff Size: Normal)   Pulse 67   Resp 16   Ht 5' 8 (1.727 m)   Wt 188 lb (85.3 kg)   SpO2 97%   BMI 28.59 kg/m    Wt Readings from Last 3 Encounters:  01/18/24 188 lb (85.3 kg)  10/25/23 190 lb (86.2 kg)  09/11/22 231 lb (104.8 kg)     GEN: Well nourished, well developed in no acute distress NECK: No JVD; No carotid bruits CARDIAC: RRR, no murmurs, rubs, gallops RESPIRATORY:  Clear to auscultation without rales, wheezing or rhonchi  ABDOMEN: Soft, non-tender, non-distended EXTREMITIES:  No edema; No deformity    ASSESSMENT AND PLAN:   PAF: He had no symptomatic recurrence.  No change in therapy.   CAD: He has no new symptoms.  He had a negative perfusion study and unremarkable echo.  He feels better.  No further workup is suggested.   Essential hypertension: The blood pressure is at target.  No change in therapy.    Chronic diastolic CHF :    He did have a borderline BNP level but he is having no symptoms.  Echocardiogram looked okay.  At this point he sounds euvolemic.  No change in therapy.     Hyperlipidemia :  LDL was 21.  No change in therapy.    OSA : He uses CPAP.   Sinusitis: The patient is symptoms are consistent with sinusitis.  I will go ahead and give him a Z-Pak and have him follow-up with his primary provider.     Follow up with me in one year.   Signed, Lynwood Schilling, MD

## 2024-01-18 ENCOUNTER — Encounter: Payer: Self-pay | Admitting: Cardiology

## 2024-01-18 ENCOUNTER — Ambulatory Visit: Attending: Cardiology | Admitting: Cardiology

## 2024-01-18 VITALS — BP 119/82 | HR 67 | Resp 16 | Ht 68.0 in | Wt 188.0 lb

## 2024-01-18 DIAGNOSIS — I480 Paroxysmal atrial fibrillation: Secondary | ICD-10-CM

## 2024-01-18 DIAGNOSIS — I5032 Chronic diastolic (congestive) heart failure: Secondary | ICD-10-CM | POA: Diagnosis not present

## 2024-01-18 DIAGNOSIS — I10 Essential (primary) hypertension: Secondary | ICD-10-CM | POA: Diagnosis not present

## 2024-01-18 DIAGNOSIS — I2510 Atherosclerotic heart disease of native coronary artery without angina pectoris: Secondary | ICD-10-CM

## 2024-01-18 MED ORDER — ZITHROMAX 1 G PO PACK: 1.0000 g | PACK | Freq: Once | ORAL | 0 refills | Status: AC

## 2024-01-18 NOTE — Patient Instructions (Signed)
 Medication Instructions:  Z pack *If you need a refill on your cardiac medications before your next appointment, please call your pharmacy*  Lab Work: NONE If you have labs (blood work) drawn today and your tests are completely normal, you will receive your results only by: MyChart Message (if you have MyChart) OR A paper copy in the mail If you have any lab test that is abnormal or we need to change your treatment, we will call you to review the results.  Testing/Procedures: NONE  Follow-Up: At [facility], you and your health needs are our priority.  As part of our continuing mission to provide you with exceptional heart care, our providers are all part of one team.  This team includes your primary Cardiologist (physician) and Advanced Practice Providers or APPs (Physician Assistants and Nurse Practitioners) who all work together to provide you with the care you need, when you need it.  Your next appointment:   1 year(s)  Provider:   [place], MD  We recommend signing up for the patient portal called MyChart.  Sign up information is provided on this After Visit Summary.  MyChart is used to connect with patients for Virtual Visits (Telemedicine).  Patients are able to view lab/test results, encounter notes, upcoming appointments, etc.  Non-urgent messages can be sent to your provider as well.   To learn more about what you can do with MyChart, go to [email or web address].

## 2024-01-22 DIAGNOSIS — I10 Essential (primary) hypertension: Secondary | ICD-10-CM | POA: Diagnosis not present

## 2024-01-22 DIAGNOSIS — E039 Hypothyroidism, unspecified: Secondary | ICD-10-CM | POA: Diagnosis not present

## 2024-01-22 DIAGNOSIS — R739 Hyperglycemia, unspecified: Secondary | ICD-10-CM | POA: Diagnosis not present

## 2024-01-22 DIAGNOSIS — Z0000 Encounter for general adult medical examination without abnormal findings: Secondary | ICD-10-CM | POA: Diagnosis not present

## 2024-01-22 DIAGNOSIS — E781 Pure hyperglyceridemia: Secondary | ICD-10-CM | POA: Diagnosis not present

## 2024-01-22 DIAGNOSIS — R7303 Prediabetes: Secondary | ICD-10-CM | POA: Diagnosis not present

## 2024-01-22 DIAGNOSIS — E559 Vitamin D deficiency, unspecified: Secondary | ICD-10-CM | POA: Diagnosis not present

## 2024-01-23 ENCOUNTER — Telehealth: Payer: Self-pay | Admitting: Cardiology

## 2024-01-23 ENCOUNTER — Ambulatory Visit: Payer: Self-pay | Admitting: Cardiology

## 2024-01-23 LAB — LAB REPORT - SCANNED
A1c: 4.9
EGFR: 87
TSH: 2.22 (ref 0.41–5.90)

## 2024-01-23 MED ORDER — AZITHROMYCIN 250 MG PO TABS: ORAL_TABLET | ORAL | 0 refills | Status: DC

## 2024-01-23 NOTE — Telephone Encounter (Signed)
 Pt c/o medication issue:  1. Name of Medication: azithromycin  (ZITHROMAX ) 1 g powder [[id number]]  ENDED   Order Details   2. How are you currently taking this medication (dosage and times per day)? As written   3. Are you having a reaction (difficulty breathing--STAT)? No   4. What is your medication issue? Pharmacy called in stating pt came in to pick this medication up but he thought it was supposed to be a pill but it was for a powder packet. She asked if this can be changed and resent over. Please advise.

## 2024-01-23 NOTE — Telephone Encounter (Signed)
 Left message for [facility] and for patient informing them prescription has been updated to correct form: Z-Pak 250 mg tapered dose.  Provided office number for callback if any questions.

## 2024-03-04 ENCOUNTER — Ambulatory Visit: Admitting: Pulmonary Disease

## 2024-03-04 ENCOUNTER — Encounter: Payer: Self-pay | Admitting: Pulmonary Disease

## 2024-03-04 VITALS — BP 130/80 | HR 64 | Ht 68.0 in | Wt 194.0 lb

## 2024-03-04 DIAGNOSIS — R0609 Other forms of dyspnea: Secondary | ICD-10-CM | POA: Diagnosis not present

## 2024-03-04 NOTE — Patient Instructions (Signed)
 Nice to meet you  We will get pulmonary function test to see if we can arrive at a diagnosis that could be contributing to your symptoms from a pulmonary perspective and if we think we can get more evidence to suggest inhalers would be helpful.  Return to clinic based on results of PFT, pulmonary function test, please schedule next available

## 2024-03-05 NOTE — Progress Notes (Signed)
 @Patient  ID: Carl Humphrey, male    DOB: 07-20-1955, 68 y.o.   MRN: [id number]  Chief Complaint  Patient presents with   Consult    Referring provider: Royden Ronal Czar, FNP  HPI:   68 y.o. man whom were seen for evaluation of dyspnea on exertion.  Multiple cardiology notes reviewed.  Patient notes dyspnea on exertion for some time.  Does okay on flat surfaces.  Inclines stairs carrying things make things worse.  But comes winded.  Short of breath.  Occasionally breathing feels tight.  Resting makes things better.  No time of day when things are better or worse.  No seasonal or environmental factors she can notify to make things better or worse.  No position to make things better or worse.  No other relieving or Fasprin factors.  He has had cardiac workup which shows no concern for ischemia but EF 39% on PET scan, estimated low normal TTE, TTE also demonstrated diastolic dysfunction, mitral valve regurgitation, moderately dilated left atrium.  Also with atrial fibrillation.  He does not feel palpitations.  Discussed multiple cardiac abnormalities all of which can contribute to dyspnea exertion.  He had a CT scan 11/2023 that demonstrated bronchial thickening, otherwise clear, no concerning findings on my review interpretation.  Discussed based on this could be something like bronchitis or asthma which can cause shortness of breath with air trapping etc.  Offered trial of inhalers but he preferred to wait until we get more evaluation, more information.  Discussed PFTs and how this would help.  He expressed understanding.    Questionaires / Pulmonary Flowsheets:   ACT:      No data to display          MMRC:     No data to display          Epworth:      No data to display          Tests:   FENO:  No results found for: NITRICOXIDE  PFT:     No data to display          WALK:      No data to display          Imaging: Personally reviewed and as per EMR  and discussion in this note No results found.  Lab Results: Personally reviewed CBC    Component Value Date/Time   WBC 8.4 08/06/2020 1103   RBC 5.45 08/06/2020 1103   HGB 16.0 08/06/2020 1103   HGB 14.9 05/06/2019 1339   HCT 48.4 08/06/2020 1103   HCT 44.2 05/06/2019 1339   PLT 192 08/06/2020 1103   PLT 204 05/06/2019 1339   MCV 88.8 08/06/2020 1103   MCV 89 05/06/2019 1339   MCH 29.4 08/06/2020 1103   MCHC 33.1 08/06/2020 1103   RDW 13.9 08/06/2020 1103   RDW 13.0 05/06/2019 1339   LYMPHSABS 3.1 10/02/2017 1211   MONOABS 0.6 09/21/2017 1838   EOSABS 0.1 10/02/2017 1211   BASOSABS 0.0 10/02/2017 1211    BMET    Component Value Date/Time   NA 140 08/06/2020 1103   NA 141 05/06/2019 1339   K 4.2 08/06/2020 1103   CL 104 08/06/2020 1103   CO2 25 08/06/2020 1103   GLUCOSE 132 (H) 08/06/2020 1103   BUN 32 (H) 08/06/2020 1103   BUN 14 05/06/2019 1339   CREATININE 1.31 (H) 08/06/2020 1103   CREATININE 0.75 08/13/2015 1301   CALCIUM  9.3 08/06/2020 1103   GFRNONAA >  60 08/06/2020 1103   GFRAA >60 06/13/2019 1938    BNP    Component Value Date/Time   BNP 359.7 (H) 09/21/2017 1838    ProBNP    Component Value Date/Time   PROBNP 568 (H) 10/25/2023 1618    Specialty Problems       Pulmonary Problems   Obstructive sleep apnea   cpap      CAP (community acquired pneumonia)   Pleural effusion   Allergic rhinitis    Allergies  Allergen Reactions   Pravastatin  Other (See Comments)    Reports causes muscle aches and joint pain   Rosuvastatin  Other (See Comments)    Pt reports causes muscle/joint pain    Immunization History  Administered Date(s) Administered   Influenza,inj,Quad PF,6+ Mos 03/22/2017   PFIZER(Purple Top)SARS-COV-2 Vaccination 08/14/2019, 09/08/2019   Pneumococcal Polysaccharide-23 09/23/2017   Tdap 02/25/2022    Past Medical History:  Diagnosis Date   Anxiety    Arthritis    back lower pain at times   CAD (coronary artery  disease)    a. nonobstructive by CT 2019.   Chronic diastolic CHF (congestive heart failure) (HCC)    COVID jan 2021, dec  2021   rapid home test dec 2021 test due to exposure no symptoms   Depression    Dysrhythmia    AFIB   ETOH abuse    Former tobacco use    GERD (gastroesophageal reflux disease)    Headache    Hypertensive heart disease    Hypertriglyceridemia    Hypothyroidism    Obesity    Obstructive sleep apnea    a. On CPAP x ~ 10 yrs.   PAF (paroxysmal atrial fibrillation) (HCC)    a. Dx 08/13/2015, without recurrence.   Pneumonia 2018   Pre-diabetes    Prostate cancer (HCC)    Wears glasses     Tobacco History: Social History   Tobacco Use  Smoking Status Former   Current packs/day: 0.00   Average packs/day: 4.0 packs/day for 15.0 years (60.0 ttl pk-yrs)   Types: Cigarettes   Start date: 09/19/1974   Quit date: 09/18/1989   Years since quitting: 34.4  Smokeless Tobacco Former   Types: Chew   Quit date: 09/18/1997   Counseling given: Not Answered   Continue to not smoke  Outpatient Encounter Medications as of 03/04/2024  Medication Sig   Alirocumab  (PRALUENT ) 150 MG/ML SOAJ INJECT 150MG  INTO THE SKIN EVERY 14 DAYS   aspirin  EC 81 MG tablet 1 tablet   fenofibrate  160 MG tablet Take 200 mg daily by mouth   furosemide  (LASIX ) 40 MG tablet TAKE 1 TABLET BY MOUTH EVERY DAY   icosapent  Ethyl (VASCEPA ) 1 g capsule TAKE 2 CAPSULES BY MOUTH TWICE A DAY   levothyroxine  (SYNTHROID ) 100 MCG tablet Take 1 tablet by mouth daily.   metoprolol  tartrate (LOPRESSOR ) 50 MG tablet Take 1 tablet (50 mg total) by mouth 2 (two) times daily.   pantoprazole  (PROTONIX ) 40 MG tablet Take 40 mg by mouth daily.   tamsulosin (FLOMAX) 0.4 MG CAPS capsule SMARTSIG:1 Capsule(s) By Mouth Every Evening   valsartan (DIOVAN) 320 MG tablet Take 320 mg by mouth daily.   venlafaxine  XR (EFFEXOR -XR) 150 MG 24 hr capsule Take 300 mg by mouth daily.    WEGOVY  2.4 MG/0.75ML SOAJ 0.5 ML  SUBCUTANEOUS ONCE A WEEK X 4 WEEK 30 DAYS   azithromycin  (ZITHROMAX ) 250 MG tablet Take 2 tablets (500 mg ) by mouth once on the first day,  then take 1 tablet (250 mg) once daily until finished.   No facility-administered encounter medications on file as of 03/04/2024.     Review of Systems  Review of Systems  No chest pain with exertion.  No orthopnea or PND.  Comprehensive review of systems otherwise negative. Physical Exam  BP 130/80   Pulse 64   Ht 5' 8 (1.727 m)   Wt 194 lb (88 kg)   SpO2 97%   BMI 29.50 kg/m   Wt Readings from Last 5 Encounters:  03/04/24 194 lb (88 kg)  01/18/24 188 lb (85.3 kg)  10/25/23 190 lb (86.2 kg)  09/11/22 231 lb (104.8 kg)  04/26/21 227 lb (103 kg)    BMI Readings from Last 5 Encounters:  03/04/24 29.50 kg/m  01/18/24 28.59 kg/m  10/25/23 28.89 kg/m  09/11/22 35.12 kg/m  04/26/21 34.52 kg/m     Physical Exam General: Sitting in chair, no acute distress Eyes: EOMI, no icterus Neck: Supple, no JVP Pulmonary: Clear, normal rate Cardiovascular: Regular rate and rhythm, no murmur appreciated Abdomen: Nondistended MSK: No synovitis, no joint effusion Neuro: Normal gait, no weakness Psych: Normal mood, full affect   Assessment & Plan:   Dyspnea on exertion: High suspicion for primarily cardiac drivers including low ejection fraction, diastolic dysfunction, mitral regurgitation, dilated left atrium, suspect pulm venous hypertension worsening with normal physiology of exercise.  In addition, atrial fibrillation and/for the treatment of such likely contributor as well, tachycardia versus chronotropic insufficiency with treatment.  He does have some thickened bronchioles on the CT scan 11/2023.  He has about a 30-pack-year history of smoking although he quit in his 18s.  It is possible he developed asthma or later sequela of smoking-related lung disease.  PFTs for further evaluation.  Offered trial of bronchodilators but after shared  decision making we will wait to make a decision based on pulmonary function test result.  Tobacco abuse in remission: Quit smoking in his 82s, over 30 years ago.  Not a candidate for lung cancer screening.  No follow-ups on file.  Follow-up pending results of PFTs, if abnormality in the seems like would benefit for bronchodilators will prescribe an arrange follow-up, if totally normal no need for follow-up, would confirm cardiac etiology of dyspnea   Donnice JONELLE Beals, MD 03/05/2024

## 2024-03-19 DIAGNOSIS — H0520 Unspecified exophthalmos: Secondary | ICD-10-CM | POA: Diagnosis not present

## 2024-03-19 DIAGNOSIS — H04563 Stenosis of bilateral lacrimal punctum: Secondary | ICD-10-CM | POA: Diagnosis not present

## 2024-03-19 DIAGNOSIS — H04223 Epiphora due to insufficient drainage, bilateral lacrimal glands: Secondary | ICD-10-CM | POA: Diagnosis not present

## 2024-03-19 DIAGNOSIS — H04123 Dry eye syndrome of bilateral lacrimal glands: Secondary | ICD-10-CM | POA: Diagnosis not present

## 2024-03-19 DIAGNOSIS — H02112 Cicatricial ectropion of right lower eyelid: Secondary | ICD-10-CM | POA: Diagnosis not present

## 2024-03-19 DIAGNOSIS — H02115 Cicatricial ectropion of left lower eyelid: Secondary | ICD-10-CM | POA: Diagnosis not present

## 2024-03-19 DIAGNOSIS — H0279 Other degenerative disorders of eyelid and periocular area: Secondary | ICD-10-CM | POA: Diagnosis not present

## 2024-03-19 DIAGNOSIS — H02135 Senile ectropion of left lower eyelid: Secondary | ICD-10-CM | POA: Diagnosis not present

## 2024-03-19 DIAGNOSIS — H02535 Eyelid retraction left lower eyelid: Secondary | ICD-10-CM | POA: Diagnosis not present

## 2024-03-24 ENCOUNTER — Ambulatory Visit

## 2024-03-24 DIAGNOSIS — R0609 Other forms of dyspnea: Secondary | ICD-10-CM | POA: Diagnosis not present

## 2024-03-24 LAB — PULMONARY FUNCTION TEST
DL/VA % pred: 101 %
DL/VA: 4.2 ml/min/mmHg/L
DLCO unc % pred: 101 %
DLCO unc: 24.6 ml/min/mmHg
FEF 25-75 Post: 2.75 L/s
FEF 25-75 Pre: 1.81 L/s
FEF2575-%Change-Post: 51 %
FEF2575-%Pred-Post: 115 %
FEF2575-%Pred-Pre: 76 %
FEV1-%Change-Post: 13 %
FEV1-%Pred-Post: 95 %
FEV1-%Pred-Pre: 84 %
FEV1-Post: 2.89 L
FEV1-Pre: 2.55 L
FEV1FVC-%Change-Post: 5 %
FEV1FVC-%Pred-Pre: 99 %
FEV6-%Change-Post: 7 %
FEV6-%Pred-Post: 96 %
FEV6-%Pred-Pre: 89 %
FEV6-Post: 3.71 L
FEV6-Pre: 3.46 L
FEV6FVC-%Change-Post: 0 %
FEV6FVC-%Pred-Post: 105 %
FEV6FVC-%Pred-Pre: 105 %
FVC-%Change-Post: 7 %
FVC-%Pred-Post: 91 %
FVC-%Pred-Pre: 85 %
FVC-Post: 3.75 L
FVC-Pre: 3.48 L
Post FEV1/FVC ratio: 77 %
Post FEV6/FVC ratio: 99 %
Pre FEV1/FVC ratio: 74 %
Pre FEV6/FVC Ratio: 99 %
RV % pred: 137 %
RV: 3.08 L
TLC % pred: 106 %
TLC: 6.86 L

## 2024-03-24 NOTE — Patient Instructions (Signed)
 Full pft performed today

## 2024-03-24 NOTE — Progress Notes (Signed)
 Full pft performed today

## 2024-03-27 DIAGNOSIS — L570 Actinic keratosis: Secondary | ICD-10-CM | POA: Diagnosis not present

## 2024-03-27 DIAGNOSIS — L578 Other skin changes due to chronic exposure to nonionizing radiation: Secondary | ICD-10-CM | POA: Diagnosis not present

## 2024-04-14 DIAGNOSIS — H25013 Cortical age-related cataract, bilateral: Secondary | ICD-10-CM | POA: Diagnosis not present

## 2024-04-14 DIAGNOSIS — H2513 Age-related nuclear cataract, bilateral: Secondary | ICD-10-CM | POA: Diagnosis not present

## 2024-04-14 DIAGNOSIS — H25042 Posterior subcapsular polar age-related cataract, left eye: Secondary | ICD-10-CM | POA: Diagnosis not present

## 2024-04-24 DIAGNOSIS — M545 Low back pain, unspecified: Secondary | ICD-10-CM | POA: Diagnosis not present
